# Patient Record
Sex: Female | Born: 1945 | Race: White | Hispanic: No | Marital: Married | Smoking: Former smoker
Health system: Southern US, Community
[De-identification: ages and names within clinical notes are randomized; demographics above are authoritative.]

## PROBLEM LIST (undated history)

## (undated) DIAGNOSIS — E7800 Pure hypercholesterolemia, unspecified: Secondary | ICD-10-CM

## (undated) DIAGNOSIS — I739 Peripheral vascular disease, unspecified: Secondary | ICD-10-CM

## (undated) DIAGNOSIS — D72829 Elevated white blood cell count, unspecified: Secondary | ICD-10-CM

## (undated) DIAGNOSIS — G4700 Insomnia, unspecified: Secondary | ICD-10-CM

## (undated) DIAGNOSIS — E669 Obesity, unspecified: Secondary | ICD-10-CM

## (undated) DIAGNOSIS — K219 Gastro-esophageal reflux disease without esophagitis: Secondary | ICD-10-CM

## (undated) DIAGNOSIS — Z9889 Other specified postprocedural states: Secondary | ICD-10-CM

## (undated) DIAGNOSIS — E039 Hypothyroidism, unspecified: Secondary | ICD-10-CM

## (undated) DIAGNOSIS — E559 Vitamin D deficiency, unspecified: Secondary | ICD-10-CM

## (undated) DIAGNOSIS — J309 Allergic rhinitis, unspecified: Secondary | ICD-10-CM

## (undated) DIAGNOSIS — J45909 Unspecified asthma, uncomplicated: Secondary | ICD-10-CM

## (undated) DIAGNOSIS — I10 Essential (primary) hypertension: Secondary | ICD-10-CM

## (undated) DIAGNOSIS — D75839 Thrombocytosis, unspecified: Secondary | ICD-10-CM

## (undated) DIAGNOSIS — M719 Bursopathy, unspecified: Secondary | ICD-10-CM

## (undated) DIAGNOSIS — I639 Cerebral infarction, unspecified: Secondary | ICD-10-CM

## (undated) DIAGNOSIS — R112 Nausea with vomiting, unspecified: Secondary | ICD-10-CM

## (undated) HISTORY — DX: Pure hypercholesterolemia, unspecified: E78.00

## (undated) HISTORY — PX: EYE SURGERY: SHX253

## (undated) HISTORY — PX: COLONOSCOPY: SHX174

## (undated) HISTORY — DX: Vitamin D deficiency, unspecified: E55.9

## (undated) HISTORY — PX: LUMBAR FUSION: SHX111

## (undated) HISTORY — PX: TONSILLECTOMY: SUR1361

## (undated) HISTORY — DX: Allergic rhinitis, unspecified: J30.9

## (undated) HISTORY — DX: Elevated white blood cell count, unspecified: D72.829

## (undated) HISTORY — DX: Obesity, unspecified: E66.9

## (undated) HISTORY — PX: OTHER SURGICAL HISTORY: SHX169

## (undated) HISTORY — DX: Thrombocytosis, unspecified: D75.839

## (undated) HISTORY — DX: Insomnia, unspecified: G47.00

---

## 1997-08-16 ENCOUNTER — Ambulatory Visit (HOSPITAL_COMMUNITY): Admission: RE | Admit: 1997-08-16 | Discharge: 1997-08-16 | Payer: Self-pay | Admitting: Obstetrics and Gynecology

## 1998-05-24 ENCOUNTER — Ambulatory Visit: Admission: RE | Admit: 1998-05-24 | Discharge: 1998-05-24 | Payer: Self-pay | Admitting: Cardiology

## 1998-08-24 ENCOUNTER — Ambulatory Visit (HOSPITAL_COMMUNITY): Admission: RE | Admit: 1998-08-24 | Discharge: 1998-08-24 | Payer: Self-pay | Admitting: Obstetrics and Gynecology

## 1998-10-27 ENCOUNTER — Other Ambulatory Visit: Admission: RE | Admit: 1998-10-27 | Discharge: 1998-10-27 | Payer: Self-pay | Admitting: *Deleted

## 1999-03-20 ENCOUNTER — Encounter: Admission: RE | Admit: 1999-03-20 | Discharge: 1999-06-18 | Payer: Self-pay | Admitting: Cardiology

## 1999-09-05 ENCOUNTER — Encounter: Payer: Self-pay | Admitting: Obstetrics and Gynecology

## 1999-09-05 ENCOUNTER — Ambulatory Visit (HOSPITAL_COMMUNITY): Admission: RE | Admit: 1999-09-05 | Discharge: 1999-09-05 | Payer: Self-pay | Admitting: Obstetrics and Gynecology

## 1999-10-06 ENCOUNTER — Other Ambulatory Visit: Admission: RE | Admit: 1999-10-06 | Discharge: 1999-10-06 | Payer: Self-pay | Admitting: Obstetrics and Gynecology

## 2000-09-25 ENCOUNTER — Ambulatory Visit (HOSPITAL_COMMUNITY): Admission: RE | Admit: 2000-09-25 | Discharge: 2000-09-25 | Payer: Self-pay | Admitting: Obstetrics and Gynecology

## 2000-09-25 ENCOUNTER — Encounter: Payer: Self-pay | Admitting: Obstetrics and Gynecology

## 2000-10-08 ENCOUNTER — Other Ambulatory Visit: Admission: RE | Admit: 2000-10-08 | Discharge: 2000-10-08 | Payer: Self-pay | Admitting: Obstetrics and Gynecology

## 2001-10-09 ENCOUNTER — Other Ambulatory Visit: Admission: RE | Admit: 2001-10-09 | Discharge: 2001-10-09 | Payer: Self-pay | Admitting: Obstetrics and Gynecology

## 2002-10-20 ENCOUNTER — Encounter: Payer: Self-pay | Admitting: Obstetrics and Gynecology

## 2002-10-20 ENCOUNTER — Ambulatory Visit (HOSPITAL_COMMUNITY): Admission: RE | Admit: 2002-10-20 | Discharge: 2002-10-20 | Payer: Self-pay | Admitting: Obstetrics and Gynecology

## 2002-10-22 ENCOUNTER — Other Ambulatory Visit: Admission: RE | Admit: 2002-10-22 | Discharge: 2002-10-22 | Payer: Self-pay | Admitting: Obstetrics and Gynecology

## 2003-03-16 ENCOUNTER — Encounter: Admission: RE | Admit: 2003-03-16 | Discharge: 2003-03-16 | Payer: Self-pay | Admitting: Internal Medicine

## 2003-06-25 ENCOUNTER — Ambulatory Visit (HOSPITAL_COMMUNITY): Admission: RE | Admit: 2003-06-25 | Discharge: 2003-06-25 | Payer: Self-pay | Admitting: Gastroenterology

## 2003-10-22 ENCOUNTER — Ambulatory Visit (HOSPITAL_COMMUNITY): Admission: RE | Admit: 2003-10-22 | Discharge: 2003-10-22 | Payer: Self-pay | Admitting: Obstetrics and Gynecology

## 2004-10-25 ENCOUNTER — Encounter: Admission: RE | Admit: 2004-10-25 | Discharge: 2004-10-25 | Payer: Self-pay | Admitting: Obstetrics and Gynecology

## 2005-01-24 ENCOUNTER — Encounter (INDEPENDENT_AMBULATORY_CARE_PROVIDER_SITE_OTHER): Payer: Self-pay | Admitting: Specialist

## 2005-01-24 ENCOUNTER — Ambulatory Visit (HOSPITAL_BASED_OUTPATIENT_CLINIC_OR_DEPARTMENT_OTHER): Admission: RE | Admit: 2005-01-24 | Discharge: 2005-01-24 | Payer: Self-pay | Admitting: Orthopedic Surgery

## 2005-03-08 ENCOUNTER — Encounter: Admission: RE | Admit: 2005-03-08 | Discharge: 2005-06-06 | Payer: Self-pay | Admitting: Family Medicine

## 2005-10-29 ENCOUNTER — Encounter: Admission: RE | Admit: 2005-10-29 | Discharge: 2005-10-29 | Payer: Self-pay | Admitting: Obstetrics and Gynecology

## 2006-11-12 ENCOUNTER — Encounter: Admission: RE | Admit: 2006-11-12 | Discharge: 2006-11-12 | Payer: Self-pay | Admitting: Obstetrics and Gynecology

## 2007-01-13 ENCOUNTER — Encounter: Admission: RE | Admit: 2007-01-13 | Discharge: 2007-01-13 | Payer: Self-pay | Admitting: Gastroenterology

## 2007-11-14 ENCOUNTER — Encounter: Admission: RE | Admit: 2007-11-14 | Discharge: 2007-11-14 | Payer: Self-pay | Admitting: Obstetrics and Gynecology

## 2007-11-25 ENCOUNTER — Encounter: Admission: RE | Admit: 2007-11-25 | Discharge: 2007-11-25 | Payer: Self-pay | Admitting: Obstetrics and Gynecology

## 2008-12-16 ENCOUNTER — Encounter: Admission: RE | Admit: 2008-12-16 | Discharge: 2008-12-16 | Payer: Self-pay | Admitting: Obstetrics and Gynecology

## 2009-12-20 ENCOUNTER — Encounter: Admission: RE | Admit: 2009-12-20 | Discharge: 2009-12-20 | Payer: Self-pay | Admitting: Obstetrics and Gynecology

## 2010-06-02 NOTE — Op Note (Signed)
NAME:  Monica Hinton, Monica Hinton                         ACCOUNT NO.:  [id number]   MEDICAL RECORD NO.:  [id number]                   PATIENT TYPE:  AMB   LOCATION:  ENDO                                 FACILITY:  [facility]   PHYSICIAN:  Bernette Redbird, M.D.                DATE OF BIRTH:  1945-07-06   DATE OF PROCEDURE:  06/25/2003  DATE OF DISCHARGE:                                 OPERATIVE REPORT   PROCEDURE:  Colonoscopy.   INDICATIONS FOR PROCEDURE:  Family history of colon cancer in her mother at  age 85.  The patient had a negative colonoscopy about six years ago.   OPERATIVE FINDINGS:  Severe melanosis coli.   PROCEDURE:  The patient provided written consent for the procedure.  Sedation was Fentanyl 75 mcg and Versed 8.5 mg IV without arrhythmias or  desaturations.  The Olympus adjustable tension pediatric video colonoscope  was advanced without significant difficult to the terminal ileum which had a  normal appearance and pull back was then performed.  The quality of the prep  was excellent and it was felt that all areas were well seen.  There was  severe melanosis coli throughout the colon, but this was, otherwise, a  normal examination without evidence of polyps, cancer, colitis, vascular  malformations, or diverticulosis.  Retroflexion in the rectum was  unremarkable.  No biopsies were taken.  The patient tolerated the procedure  well and there were no apparent complications.   IMPRESSION:  1. Family history of colon cancer without worrisome findings on this exam     (V16.0).  2. 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   PLAN:  Follow up colonoscopy in five years for ongoing screening.                                               Bernette Redbird, M.D.    RB/MEDQ  D:  06/25/2003  T:  06/25/2003  Job:  [id number]   cc:   Joycelyn Rua, M.D.  [address] [place]  [place] [address]  Fax: [phone]

## 2010-06-02 NOTE — Op Note (Signed)
NAME:  Monica Hinton, Monica Hinton               ACCOUNT NO.:  [id number]   MEDICAL RECORD NO.:  [id number]          PATIENT TYPE:  AMB   LOCATION:  DSC                          FACILITY:  [facility]   PHYSICIAN:  Cindee Salt, M.D.       DATE OF BIRTH:  1945-05-12   DATE OF PROCEDURE:  01/24/2005  DATE OF DISCHARGE:                                 OPERATIVE REPORT   PREOPERATIVE DIAGNOSIS:  Cyst dorsal right wrist.   POSTOPERATIVE DIAGNOSIS:  Cyst dorsal right wrist.   OPERATION:  Excision cyst, dorsal right wrist.   SURGEON:  Cindee Salt, M.D.   ASSISTANTCarolyne Fiscal R.N.   ANESTHESIA:  IV regional.   HISTORY:  The patient is a 66 year old female with a history of a mass on  the dorsal aspect of her right wrist. This not responded to conservative  treatment. She is desirous of removal   DESCRIPTION OF PROCEDURE:  The patient is brought to the operating room  where a forearm based IV regional anesthetic was carried out without  difficulty. She was prepped using DuraPrep, in the supine position with the  right arm free. A transverse incision was made over the mass carried down  through subcutaneous tissue. Bleeders were electrocauterized, dorsal sensory  nerves were identified and protected, retractors placed. The retinaculum was  separated.  With blunt dissection a large cystic structure was immediately  encountered. With blunt and sharp dissection this was dissected free. This  was found to be extremely large; was deflated. The stalk was followed down  into the scapholunate ligament complex; the joint opened.  The stalk and  cyst were removed and sent to pathology.   The area was debrided. The cyst arising from the scapholunate ligament. No  further lesions were identified. A rongeur was used to debride the area of  the scapholunate protecting the dorsal component. The wound was irrigated.  The capsule closed with figure-of-eight 4-0 Vicryl sutures. The retinaculum  and subcutaneous tissue were  closed interrupted 4-0 Vicryl and the skin with  a subcuticular 4-0 Monocryl suture. Steri-Strips were applied. Sterile  compressive dressing and volar wrist splint applied. The patient tolerated  the procedure well; and was taken to the recovery room for observation in  satisfactory condition. She is discharged home to return to the [facility]  of [place] in 1 week on [place] [facility].           ______________________________  Cindee Salt, M.D.     GK/MEDQ  D:  01/24/2005  T:  01/24/2005  Job:  [id number]

## 2010-11-16 ENCOUNTER — Other Ambulatory Visit: Payer: Self-pay | Admitting: Obstetrics and Gynecology

## 2010-11-21 ENCOUNTER — Other Ambulatory Visit: Payer: Self-pay | Admitting: Obstetrics and Gynecology

## 2010-11-21 DIAGNOSIS — Z1231 Encounter for screening mammogram for malignant neoplasm of breast: Secondary | ICD-10-CM

## 2010-11-21 DIAGNOSIS — Z780 Asymptomatic menopausal state: Secondary | ICD-10-CM

## 2010-12-22 ENCOUNTER — Ambulatory Visit
Admission: RE | Admit: 2010-12-22 | Discharge: 2010-12-22 | Disposition: A | Discharge status: Home / Self Care | Payer: 59 | Source: Ambulatory Visit | Attending: Obstetrics and Gynecology | Admitting: Obstetrics and Gynecology

## 2010-12-22 DIAGNOSIS — Z780 Asymptomatic menopausal state: Secondary | ICD-10-CM

## 2010-12-22 DIAGNOSIS — Z1231 Encounter for screening mammogram for malignant neoplasm of breast: Secondary | ICD-10-CM

## 2011-11-12 ENCOUNTER — Ambulatory Visit: Payer: 59 | Admitting: Family Medicine

## 2011-11-12 ENCOUNTER — Telehealth: Payer: Self-pay | Admitting: Family Medicine

## 2011-11-19 ENCOUNTER — Other Ambulatory Visit: Payer: Self-pay | Admitting: Obstetrics and Gynecology

## 2011-11-19 DIAGNOSIS — Z1231 Encounter for screening mammogram for malignant neoplasm of breast: Secondary | ICD-10-CM

## 2011-11-20 ENCOUNTER — Other Ambulatory Visit: Payer: Self-pay | Admitting: Obstetrics and Gynecology

## 2011-11-22 ENCOUNTER — Other Ambulatory Visit: Payer: Self-pay | Admitting: Obstetrics and Gynecology

## 2011-11-22 DIAGNOSIS — M810 Age-related osteoporosis without current pathological fracture: Secondary | ICD-10-CM

## 2011-12-24 ENCOUNTER — Ambulatory Visit
Admission: RE | Admit: 2011-12-24 | Discharge: 2011-12-24 | Disposition: A | Discharge status: Home / Self Care | Payer: 59 | Source: Ambulatory Visit | Attending: Obstetrics and Gynecology | Admitting: Obstetrics and Gynecology

## 2011-12-24 DIAGNOSIS — Z1231 Encounter for screening mammogram for malignant neoplasm of breast: Secondary | ICD-10-CM

## 2012-11-21 ENCOUNTER — Other Ambulatory Visit: Payer: Self-pay

## 2012-11-21 DIAGNOSIS — Z1231 Encounter for screening mammogram for malignant neoplasm of breast: Secondary | ICD-10-CM

## 2012-12-24 ENCOUNTER — Other Ambulatory Visit: Payer: Self-pay | Admitting: Obstetrics and Gynecology

## 2012-12-24 DIAGNOSIS — M810 Age-related osteoporosis without current pathological fracture: Secondary | ICD-10-CM

## 2012-12-25 ENCOUNTER — Ambulatory Visit
Admission: RE | Admit: 2012-12-25 | Discharge: 2012-12-25 | Disposition: A | Discharge status: Home / Self Care | Payer: 59 | Source: Ambulatory Visit | Attending: Obstetrics and Gynecology | Admitting: Obstetrics and Gynecology

## 2012-12-25 ENCOUNTER — Ambulatory Visit
Admission: RE | Admit: 2012-12-25 | Discharge: 2012-12-25 | Disposition: A | Discharge status: Home / Self Care | Payer: 59 | Source: Ambulatory Visit

## 2012-12-25 ENCOUNTER — Other Ambulatory Visit: Payer: 59

## 2012-12-25 DIAGNOSIS — M810 Age-related osteoporosis without current pathological fracture: Secondary | ICD-10-CM

## 2012-12-25 DIAGNOSIS — Z1231 Encounter for screening mammogram for malignant neoplasm of breast: Secondary | ICD-10-CM

## 2013-06-10 NOTE — Telephone Encounter (Signed)
error 

## 2013-12-14 ENCOUNTER — Other Ambulatory Visit: Payer: Self-pay

## 2013-12-14 DIAGNOSIS — Z1231 Encounter for screening mammogram for malignant neoplasm of breast: Secondary | ICD-10-CM

## 2014-01-13 ENCOUNTER — Ambulatory Visit
Admission: RE | Admit: 2014-01-13 | Discharge: 2014-01-13 | Disposition: A | Discharge status: Home / Self Care | Payer: 59 | Source: Ambulatory Visit

## 2014-01-13 DIAGNOSIS — Z1231 Encounter for screening mammogram for malignant neoplasm of breast: Secondary | ICD-10-CM

## 2014-01-21 ENCOUNTER — Ambulatory Visit: Payer: 59

## 2014-12-07 ENCOUNTER — Other Ambulatory Visit: Payer: Self-pay

## 2014-12-07 DIAGNOSIS — R928 Other abnormal and inconclusive findings on diagnostic imaging of breast: Secondary | ICD-10-CM

## 2015-01-18 ENCOUNTER — Ambulatory Visit: Payer: Self-pay

## 2015-02-07 ENCOUNTER — Ambulatory Visit: Payer: Self-pay

## 2015-02-28 ENCOUNTER — Ambulatory Visit: Payer: Self-pay

## 2015-03-16 ENCOUNTER — Ambulatory Visit: Payer: Self-pay

## 2015-03-16 ENCOUNTER — Other Ambulatory Visit: Payer: Self-pay | Admitting: Obstetrics and Gynecology

## 2015-03-16 DIAGNOSIS — E2839 Other primary ovarian failure: Secondary | ICD-10-CM

## 2015-04-04 ENCOUNTER — Ambulatory Visit
Admission: RE | Admit: 2015-04-04 | Discharge: 2015-04-04 | Disposition: A | Discharge status: Home / Self Care | Payer: 59 | Source: Ambulatory Visit

## 2015-04-04 ENCOUNTER — Other Ambulatory Visit: Payer: Self-pay

## 2015-04-04 DIAGNOSIS — R928 Other abnormal and inconclusive findings on diagnostic imaging of breast: Secondary | ICD-10-CM

## 2015-04-22 ENCOUNTER — Ambulatory Visit
Admission: RE | Admit: 2015-04-22 | Discharge: 2015-04-22 | Disposition: A | Discharge status: Home / Self Care | Payer: 59 | Source: Ambulatory Visit | Attending: Obstetrics and Gynecology | Admitting: Obstetrics and Gynecology

## 2015-04-22 DIAGNOSIS — E2839 Other primary ovarian failure: Secondary | ICD-10-CM

## 2016-08-24 NOTE — Progress Notes (Signed)
Please place orders in EPIC as patient is being scheduled for a pre-op appointment! Thank you! 

## 2016-08-27 ENCOUNTER — Ambulatory Visit: Payer: Self-pay | Admitting: Orthopedic Surgery

## 2016-09-12 NOTE — Patient Instructions (Addendum)
POLETTE NOFSINGER  09/12/2016   Your procedure is scheduled on: 09-19-16  Report to [facility] Main  Entrance    Report to admitting at 2:00PM   Call this number if you have problems the morning of surgery  [phone]   Remember: ONLY 1 PERSON MAY GO WITH YOU TO SHORT STAY TO GET  READY MORNING OF [place].  Do not eat food After Midnight. You may have clear liquids from midnight until 1030am day of surgery. Nothing by mouth after 1030am!!     Take these medicines the morning of surgery with A SIP OF WATER: levothyroxine(synthroid), rosuvastatin(crestor), rabeprazole(aciphex)                                You may not have any metal on your body including hair pins and              piercings  Do not wear jewelry, make-up, lotions, powders or perfumes, deodorant             Do not wear nail polish.  Do not shave  48 hours prior to surgery.     Do not bring valuables to the hospital. [place].  Contacts, dentures or bridgework may not be worn into surgery.  Leave suitcase in the car. After surgery it may be brought to your room.               Please read over the following fact sheets you were given: _____________________________________________________________________           [facility] - Preparing for Surgery Before surgery, you can play an important role.  Because skin is not sterile, your skin needs to be as free of germs as possible.  You can reduce the number of germs on your skin by washing with CHG (chlorahexidine gluconate) soap before surgery.  CHG is an antiseptic cleaner which kills germs and bonds with the skin to continue killing germs even after washing. Please DO NOT use if you have an allergy to CHG or antibacterial soaps.  If your skin becomes reddened/irritated stop using the CHG and inform your nurse when you arrive at Short Stay. Do not shave (including legs and underarms) for at  least 48 hours prior to the first CHG shower.  You may shave your face/neck. Please follow these instructions carefully:  1.  Shower with CHG Soap the night before surgery and the  morning of Surgery.  2.  If you choose to wash your hair, wash your hair first as usual with your  normal  shampoo.  3.  After you shampoo, rinse your hair and body thoroughly to remove the  shampoo.                           4.  Use CHG as you would any other liquid soap.  You can apply chg directly  to the skin and wash                       Gently with a scrungie or clean washcloth.  5.  Apply the CHG Soap to your body ONLY FROM THE NECK DOWN.  Do not use on face/ open                           Wound or open sores. Avoid contact with eyes, ears mouth and genitals (private parts).                       Wash face,  Genitals (private parts) with your normal soap.             6.  Wash thoroughly, paying special attention to the area where your surgery  will be performed.  7.  Thoroughly rinse your body with warm water from the neck down.  8.  DO NOT shower/wash with your normal soap after using and rinsing off  the CHG Soap.                9.  Pat yourself dry with a clean towel.            10.  Wear clean pajamas.            11.  Place clean sheets on your bed the night of your first shower and do not  sleep with pets. Day of Surgery : Do not apply any lotions/deodorants the morning of surgery.  Please wear clean clothes to the hospital/surgery center.  FAILURE TO FOLLOW THESE INSTRUCTIONS MAY RESULT IN THE CANCELLATION OF YOUR SURGERY PATIENT SIGNATURE_________________________________  NURSE SIGNATURE__________________________________  ________________________________________________________________________   Adam Phenix  An incentive spirometer is a tool that can help keep your lungs clear and active. This tool measures how well you are filling your lungs with each breath. Taking long deep breaths  may help reverse or decrease the chance of developing breathing (pulmonary) problems (especially infection) following:  A long period of time when you are unable to move or be active. BEFORE THE PROCEDURE   If the spirometer includes an indicator to show your best effort, your nurse or respiratory therapist will set it to a desired goal.  If possible, sit up straight or lean slightly forward. Try not to slouch.  Hold the incentive spirometer in an upright position. INSTRUCTIONS FOR USE  1. Sit on the edge of your bed if possible, or sit up as far as you can in bed or on a chair. 2. Hold the incentive spirometer in an upright position. 3. Breathe out normally. 4. Place the mouthpiece in your mouth and seal your lips tightly around it. 5. Breathe in slowly and as deeply as possible, raising the piston or the ball toward the top of the column. 6. Hold your breath for 3-5 seconds or for as long as possible. Allow the piston or ball to fall to the bottom of the column. 7. Remove the mouthpiece from your mouth and breathe out normally. 8. Rest for a few seconds and repeat Steps 1 through 7 at least 10 times every 1-2 hours when you are awake. Take your time and take a few normal breaths between deep breaths. 9. The spirometer may include an indicator to show your best effort. Use the indicator as a goal to work toward during each repetition. 10. After each set of 10 deep breaths, practice coughing to be sure your lungs are clear. If you have an incision (the cut made at the time of surgery), support your incision when coughing by placing a pillow or rolled up towels firmly against it. Once you are able to get out of  bed, walk around indoors and cough well. You may stop using the incentive spirometer when instructed by your caregiver.  RISKS AND COMPLICATIONS  Take your time so you do not get dizzy or light-headed.  If you are in pain, you may need to take or ask for pain medication before doing  incentive spirometry. It is harder to take a deep breath if you are having pain. AFTER USE  Rest and breathe slowly and easily.  It can be helpful to keep track of a log of your progress. Your caregiver can provide you with a simple table to help with this. If you are using the spirometer at home, follow these instructions: [place] IF:   You are having difficultly using the spirometer.  You have trouble using the spirometer as often as instructed.  Your pain medication is not giving enough relief while using the spirometer.  You develop fever of 100.5 F (38.1 C) or higher. SEEK IMMEDIATE MEDICAL CARE IF:   You cough up bloody sputum that had not been present before.  You develop fever of 102 F (38.9 C) or greater.  You develop worsening pain at or near the incision site. MAKE SURE YOU:   Understand these instructions.  Will watch your condition.  Will get help right away if you are not doing well or get worse. Document Released: 05/14/2006 Document Revised: 03/26/2011 Document Reviewed: 07/15/2006 ExitCare Patient Information 2014 [place].   ________________________________________________________________________    CLEAR LIQUID DIET   Foods Allowed                                                                     Foods Excluded  Coffee and tea, regular and decaf                             liquids that you cannot  Plain Jell-O in any flavor                                             see through such as: Fruit ices (not with fruit pulp)                                     milk, soups, orange juice  Iced Popsicles                                    All solid food Carbonated beverages, regular and diet                                    Cranberry, grape and apple juices Sports drinks like Gatorade Lightly seasoned clear broth or consume(fat free) Sugar, honey syrup  Sample Menu Breakfast  Lunch                                      Supper Cranberry juice                    Beef broth                            Chicken broth Jell-O                                     Grape juice                           Apple juice Coffee or tea                        Jell-O                                      Popsicle                                                Coffee or tea                        Coffee or tea  _____________________________________________________________________

## 2016-09-13 ENCOUNTER — Encounter (HOSPITAL_COMMUNITY)
Admission: RE | Admit: 2016-09-13 | Discharge: 2016-09-13 | Disposition: A | Discharge status: Home / Self Care | Payer: Medicare Other | Source: Ambulatory Visit | Attending: Orthopedic Surgery | Admitting: Orthopedic Surgery

## 2016-09-13 ENCOUNTER — Encounter (HOSPITAL_COMMUNITY): Payer: Self-pay

## 2016-09-13 DIAGNOSIS — Z01812 Encounter for preprocedural laboratory examination: Secondary | ICD-10-CM | POA: Diagnosis present

## 2016-09-13 HISTORY — DX: Unspecified asthma, uncomplicated: J45.909

## 2016-09-13 HISTORY — DX: Essential (primary) hypertension: I10

## 2016-09-13 HISTORY — DX: Hypothyroidism, unspecified: E03.9

## 2016-09-13 HISTORY — DX: Bursopathy, unspecified: M71.9

## 2016-09-13 HISTORY — DX: Gastro-esophageal reflux disease without esophagitis: K21.9

## 2016-09-13 HISTORY — DX: Peripheral vascular disease, unspecified: I73.9

## 2016-09-13 LAB — BASIC METABOLIC PANEL
Anion gap: 10 (ref 5–15)
BUN: 8 mg/dL (ref 6–20)
CALCIUM: 8.8 mg/dL — AB (ref 8.9–10.3)
CHLORIDE: 100 mmol/L — AB (ref 101–111)
CO2: 24 mmol/L (ref 22–32)
CREATININE: 0.69 mg/dL (ref 0.44–1.00)
GFR calc Af Amer: >60 mL/min (ref >60)
GLUCOSE: 100 mg/dL — AB (ref 65–99)
Potassium: 3.2 mmol/L — ABNORMAL LOW (ref 3.5–5.1)
SODIUM: 134 mmol/L — AB (ref 135–145)

## 2016-09-13 LAB — CBC
HEMATOCRIT: 35.3 % — AB (ref 36.0–46.0)
HEMOGLOBIN: 12.1 g/dL (ref 12.0–15.0)
MCH: 29.4 pg (ref 26.0–34.0)
MCHC: 34.3 g/dL (ref 30.0–36.0)
MCV: 85.7 fL (ref 78.0–100.0)
Platelets: 362 10*3/uL (ref 150–400)
RBC: 4.12 MIL/uL (ref 3.87–5.11)
RDW: 12.3 % (ref 11.5–15.5)
WBC: 11 10*3/uL — ABNORMAL HIGH (ref 4.0–10.5)

## 2016-09-14 NOTE — Progress Notes (Signed)
07-06-16 EKG on chart

## 2016-09-19 ENCOUNTER — Encounter (HOSPITAL_COMMUNITY)
Admission: RE | Disposition: A | Discharge status: Home / Self Care | Payer: Self-pay | Source: Ambulatory Visit | Attending: Orthopedic Surgery

## 2016-09-19 ENCOUNTER — Inpatient Hospital Stay (HOSPITAL_COMMUNITY): Payer: Medicare Other | Admitting: Certified Registered"

## 2016-09-19 ENCOUNTER — Observation Stay (HOSPITAL_COMMUNITY)
Admission: RE | Admit: 2016-09-19 | Discharge: 2016-09-20 | Disposition: A | Discharge status: Home / Self Care | Payer: Medicare Other | Source: Ambulatory Visit | Attending: Orthopedic Surgery | Admitting: Orthopedic Surgery

## 2016-09-19 ENCOUNTER — Encounter (HOSPITAL_COMMUNITY): Payer: Self-pay | Admitting: *Deleted

## 2016-09-19 DIAGNOSIS — J45909 Unspecified asthma, uncomplicated: Secondary | ICD-10-CM | POA: Insufficient documentation

## 2016-09-19 DIAGNOSIS — E039 Hypothyroidism, unspecified: Secondary | ICD-10-CM | POA: Insufficient documentation

## 2016-09-19 DIAGNOSIS — I739 Peripheral vascular disease, unspecified: Secondary | ICD-10-CM | POA: Diagnosis not present

## 2016-09-19 DIAGNOSIS — E739 Lactose intolerance, unspecified: Secondary | ICD-10-CM | POA: Diagnosis not present

## 2016-09-19 DIAGNOSIS — Z880 Allergy status to penicillin: Secondary | ICD-10-CM | POA: Diagnosis not present

## 2016-09-19 DIAGNOSIS — I10 Essential (primary) hypertension: Secondary | ICD-10-CM | POA: Insufficient documentation

## 2016-09-19 DIAGNOSIS — K219 Gastro-esophageal reflux disease without esophagitis: Secondary | ICD-10-CM | POA: Diagnosis not present

## 2016-09-19 DIAGNOSIS — M66851 Spontaneous rupture of other tendons, right thigh: Secondary | ICD-10-CM | POA: Diagnosis not present

## 2016-09-19 DIAGNOSIS — M6798 Unspecified disorder of synovium and tendon, other site: Secondary | ICD-10-CM | POA: Diagnosis present

## 2016-09-19 DIAGNOSIS — Z87891 Personal history of nicotine dependence: Secondary | ICD-10-CM | POA: Diagnosis not present

## 2016-09-19 DIAGNOSIS — Z885 Allergy status to narcotic agent status: Secondary | ICD-10-CM | POA: Insufficient documentation

## 2016-09-19 DIAGNOSIS — Z888 Allergy status to other drugs, medicaments and biological substances status: Secondary | ICD-10-CM | POA: Diagnosis not present

## 2016-09-19 DIAGNOSIS — Z91030 Bee allergy status: Secondary | ICD-10-CM | POA: Insufficient documentation

## 2016-09-19 DIAGNOSIS — M71551 Other bursitis, not elsewhere classified, right hip: Secondary | ICD-10-CM | POA: Diagnosis present

## 2016-09-19 DIAGNOSIS — Z79899 Other long term (current) drug therapy: Secondary | ICD-10-CM | POA: Insufficient documentation

## 2016-09-19 DIAGNOSIS — M67951 Unspecified disorder of synovium and tendon, right thigh: Secondary | ICD-10-CM | POA: Diagnosis present

## 2016-09-19 HISTORY — PX: OPEN SURGICAL REPAIR OF GLUTEAL TENDON: SHX5995

## 2016-09-19 LAB — CREATININE, SERUM
Creatinine, Ser: 0.6 mg/dL (ref 0.44–1.00)
GFR calc Af Amer: >60 mL/min (ref >60)
GFR calc non Af Amer: >60 mL/min (ref >60)

## 2016-09-19 LAB — CBC
HEMATOCRIT: 33.6 % — AB (ref 36.0–46.0)
HEMOGLOBIN: 11.4 g/dL — AB (ref 12.0–15.0)
MCH: 29.6 pg (ref 26.0–34.0)
MCHC: 33.9 g/dL (ref 30.0–36.0)
MCV: 87.3 fL (ref 78.0–100.0)
Platelets: 314 10*3/uL (ref 150–400)
RBC: 3.85 MIL/uL — AB (ref 3.87–5.11)
RDW: 12.3 % (ref 11.5–15.5)
WBC: 16.2 10*3/uL — ABNORMAL HIGH (ref 4.0–10.5)

## 2016-09-19 SURGERY — REPAIR, TENDON, GLUTEUS MEDIUS, OPEN
Anesthesia: General | Site: Hip | Laterality: Right

## 2016-09-19 MED ORDER — ACETAMINOPHEN 500 MG PO TABS
1000.0000 mg | ORAL_TABLET | Freq: Four times a day (QID) | ORAL | Status: DC
  Administered 2016-09-20: 1000 mg via ORAL
  Filled 2016-09-19: qty 2

## 2016-09-19 MED ORDER — MIDAZOLAM HCL 2 MG/2ML IJ SOLN
INTRAMUSCULAR | Status: AC
  Filled 2016-09-19: qty 2

## 2016-09-19 MED ORDER — HYDROMORPHONE HCL 2 MG PO TABS
2.0000 mg | ORAL_TABLET | ORAL | Status: DC | PRN
  Administered 2016-09-20 (×3): 4 mg via ORAL
  Filled 2016-09-19 (×3): qty 2

## 2016-09-19 MED ORDER — HYDROMORPHONE HCL-NACL 0.5-0.9 MG/ML-% IV SOSY
PREFILLED_SYRINGE | INTRAVENOUS | Status: AC
  Administered 2016-09-19: 0.5 mg via INTRAVENOUS
  Filled 2016-09-19: qty 2

## 2016-09-19 MED ORDER — ONDANSETRON HCL 4 MG/2ML IJ SOLN
INTRAMUSCULAR | Status: DC | PRN
  Administered 2016-09-19: 4 mg via INTRAVENOUS

## 2016-09-19 MED ORDER — PHENYLEPHRINE 40 MCG/ML (10ML) SYRINGE FOR IV PUSH (FOR BLOOD PRESSURE SUPPORT)
PREFILLED_SYRINGE | INTRAVENOUS | Status: AC
  Filled 2016-09-19: qty 10

## 2016-09-19 MED ORDER — ENOXAPARIN SODIUM 40 MG/0.4ML ~~LOC~~ SOLN
40.0000 mg | SUBCUTANEOUS | Status: DC
  Administered 2016-09-20: 08:00:00 40 mg via SUBCUTANEOUS
  Filled 2016-09-19: qty 0.4

## 2016-09-19 MED ORDER — ACETAMINOPHEN 10 MG/ML IV SOLN
INTRAVENOUS | Status: AC
Start: 2016-09-19 — End: 2016-09-19
  Filled 2016-09-19: qty 100

## 2016-09-19 MED ORDER — PROMETHAZINE HCL 25 MG/ML IJ SOLN: 6.2500 mg | INTRAMUSCULAR | Status: DC | PRN

## 2016-09-19 MED ORDER — PANTOPRAZOLE SODIUM 40 MG PO TBEC
40.0000 mg | DELAYED_RELEASE_TABLET | Freq: Every day | ORAL | Status: DC
  Administered 2016-09-20: 40 mg via ORAL
  Filled 2016-09-19: qty 1

## 2016-09-19 MED ORDER — HYDROCHLOROTHIAZIDE 25 MG PO TABS
25.0000 mg | ORAL_TABLET | Freq: Every day | ORAL | Status: DC
  Administered 2016-09-20: 08:00:00 25 mg via ORAL
  Filled 2016-09-19: qty 1

## 2016-09-19 MED ORDER — DEXAMETHASONE SODIUM PHOSPHATE 10 MG/ML IJ SOLN
INTRAMUSCULAR | Status: AC
  Filled 2016-09-19: qty 1

## 2016-09-19 MED ORDER — ONDANSETRON HCL 4 MG/2ML IJ SOLN
INTRAMUSCULAR | Status: AC
  Filled 2016-09-19: qty 4

## 2016-09-19 MED ORDER — DEXAMETHASONE SODIUM PHOSPHATE 10 MG/ML IJ SOLN
10.0000 mg | Freq: Once | INTRAMUSCULAR | Status: AC
  Administered 2016-09-19: 10 mg via INTRAVENOUS

## 2016-09-19 MED ORDER — ONDANSETRON HCL 4 MG PO TABS: 4.0000 mg | ORAL_TABLET | Freq: Four times a day (QID) | ORAL | Status: DC | PRN

## 2016-09-19 MED ORDER — CEFAZOLIN SODIUM-DEXTROSE 2-4 GM/100ML-% IV SOLN
INTRAVENOUS | Status: AC
  Filled 2016-09-19: qty 100

## 2016-09-19 MED ORDER — SUGAMMADEX SODIUM 200 MG/2ML IV SOLN
INTRAVENOUS | Status: AC
  Filled 2016-09-19: qty 2

## 2016-09-19 MED ORDER — BUPIVACAINE HCL (PF) 0.25 % IJ SOLN
INTRAMUSCULAR | Status: DC | PRN
  Administered 2016-09-19: 30 mL

## 2016-09-19 MED ORDER — SODIUM CHLORIDE 0.9 % IJ SOLN
INTRAMUSCULAR | Status: DC | PRN
Start: 2016-09-19 — End: 2016-09-19
  Administered 2016-09-19: 50 mL

## 2016-09-19 MED ORDER — SODIUM CHLORIDE 0.9 % IJ SOLN
INTRAMUSCULAR | Status: AC
  Filled 2016-09-19: qty 50

## 2016-09-19 MED ORDER — SCOPOLAMINE 1 MG/3DAYS TD PT72
MEDICATED_PATCH | TRANSDERMAL | Status: AC
  Filled 2016-09-19: qty 1

## 2016-09-19 MED ORDER — METOCLOPRAMIDE HCL 5 MG/ML IJ SOLN: 5.0000 mg | Freq: Three times a day (TID) | INTRAMUSCULAR | Status: DC | PRN

## 2016-09-19 MED ORDER — SUCCINYLCHOLINE CHLORIDE 200 MG/10ML IV SOSY
PREFILLED_SYRINGE | INTRAVENOUS | Status: AC
  Filled 2016-09-19: qty 10

## 2016-09-19 MED ORDER — POVIDONE-IODINE 10 % EX SOLN
Freq: Once | CUTANEOUS | Status: DC
  Filled 2016-09-19: qty 118

## 2016-09-19 MED ORDER — LIDOCAINE 2% (20 MG/ML) 5 ML SYRINGE
INTRAMUSCULAR | Status: AC
  Filled 2016-09-19: qty 15

## 2016-09-19 MED ORDER — LORATADINE 10 MG PO TABS
10.0000 mg | ORAL_TABLET | Freq: Every day | ORAL | Status: DC
  Filled 2016-09-19: qty 1

## 2016-09-19 MED ORDER — ONDANSETRON HCL 4 MG/2ML IJ SOLN
INTRAMUSCULAR | Status: AC
  Filled 2016-09-19: qty 2

## 2016-09-19 MED ORDER — MORPHINE SULFATE (PF) 2 MG/ML IV SOLN
1.0000 mg | INTRAVENOUS | Status: DC | PRN
  Administered 2016-09-19 (×2): 1 mg via INTRAVENOUS
  Filled 2016-09-19 (×2): qty 1

## 2016-09-19 MED ORDER — ROSUVASTATIN CALCIUM 10 MG PO TABS: 10.0000 mg | ORAL_TABLET | Freq: Every day | ORAL | Status: DC

## 2016-09-19 MED ORDER — BUPIVACAINE HCL (PF) 0.25 % IJ SOLN
INTRAMUSCULAR | Status: AC
  Filled 2016-09-19: qty 30

## 2016-09-19 MED ORDER — SCOPOLAMINE 1 MG/3DAYS TD PT72
MEDICATED_PATCH | TRANSDERMAL | Status: DC | PRN
  Administered 2016-09-19: 1 via TRANSDERMAL

## 2016-09-19 MED ORDER — TRAMADOL HCL 50 MG PO TABS
50.0000 mg | ORAL_TABLET | Freq: Four times a day (QID) | ORAL | Status: DC | PRN
  Administered 2016-09-19: 21:00:00 100 mg via ORAL
  Filled 2016-09-19: qty 2

## 2016-09-19 MED ORDER — PROPOFOL 10 MG/ML IV BOLUS
INTRAVENOUS | Status: AC
  Filled 2016-09-19: qty 20

## 2016-09-19 MED ORDER — LEVOTHYROXINE SODIUM 100 MCG PO TABS
100.0000 ug | ORAL_TABLET | Freq: Every day | ORAL | Status: DC
  Administered 2016-09-20: 100 ug via ORAL
  Filled 2016-09-19: qty 1

## 2016-09-19 MED ORDER — ONDANSETRON HCL 4 MG/2ML IJ SOLN: 4.0000 mg | Freq: Four times a day (QID) | INTRAMUSCULAR | Status: DC | PRN

## 2016-09-19 MED ORDER — ROCURONIUM BROMIDE 10 MG/ML (PF) SYRINGE
PREFILLED_SYRINGE | INTRAVENOUS | Status: DC | PRN
  Administered 2016-09-19: 40 mg via INTRAVENOUS

## 2016-09-19 MED ORDER — METHOCARBAMOL 500 MG PO TABS: 500.0000 mg | ORAL_TABLET | Freq: Four times a day (QID) | ORAL | Status: DC | PRN

## 2016-09-19 MED ORDER — ACETAMINOPHEN 325 MG PO TABS: 650.0000 mg | ORAL_TABLET | Freq: Four times a day (QID) | ORAL | Status: DC | PRN

## 2016-09-19 MED ORDER — BUPIVACAINE LIPOSOME 1.3 % IJ SUSP
INTRAMUSCULAR | Status: DC | PRN
  Administered 2016-09-19: 20 mL

## 2016-09-19 MED ORDER — SUGAMMADEX SODIUM 200 MG/2ML IV SOLN
INTRAVENOUS | Status: DC | PRN
  Administered 2016-09-19: 200 mg via INTRAVENOUS

## 2016-09-19 MED ORDER — BUPIVACAINE LIPOSOME 1.3 % IJ SUSP
20.0000 mL | Freq: Once | INTRAMUSCULAR | Status: DC
  Filled 2016-09-19: qty 20

## 2016-09-19 MED ORDER — MIDAZOLAM HCL 2 MG/2ML IJ SOLN
INTRAMUSCULAR | Status: AC
Start: 2016-09-19 — End: ?
  Filled 2016-09-19: qty 2

## 2016-09-19 MED ORDER — 0.9 % SODIUM CHLORIDE (POUR BTL) OPTIME
TOPICAL | Status: DC | PRN
  Administered 2016-09-19: 1000 mL

## 2016-09-19 MED ORDER — CEFAZOLIN SODIUM-DEXTROSE 2-4 GM/100ML-% IV SOLN
2.0000 g | INTRAVENOUS | Status: AC
  Administered 2016-09-19: 2 g via INTRAVENOUS

## 2016-09-19 MED ORDER — SODIUM CHLORIDE 0.9 % IV SOLN
INTRAVENOUS | Status: DC
  Administered 2016-09-19: 22:00:00 via INTRAVENOUS

## 2016-09-19 MED ORDER — PROPOFOL 10 MG/ML IV BOLUS
INTRAVENOUS | Status: DC | PRN
  Administered 2016-09-19: 50 mg via INTRAVENOUS
  Administered 2016-09-19: 150 mg via INTRAVENOUS

## 2016-09-19 MED ORDER — LIDOCAINE 2% (20 MG/ML) 5 ML SYRINGE
INTRAMUSCULAR | Status: DC | PRN
  Administered 2016-09-19: 60 mg via INTRAVENOUS

## 2016-09-19 MED ORDER — EPHEDRINE 5 MG/ML INJ
INTRAVENOUS | Status: AC
Start: 2016-09-19 — End: ?
  Filled 2016-09-19: qty 10

## 2016-09-19 MED ORDER — MONTELUKAST SODIUM 10 MG PO TABS
10.0000 mg | ORAL_TABLET | Freq: Every day | ORAL | Status: DC
  Filled 2016-09-19: qty 1

## 2016-09-19 MED ORDER — FENTANYL CITRATE (PF) 100 MCG/2ML IJ SOLN
INTRAMUSCULAR | Status: DC | PRN
  Administered 2016-09-19 (×2): 50 ug via INTRAVENOUS

## 2016-09-19 MED ORDER — METHOCARBAMOL 1000 MG/10ML IJ SOLN
500.0000 mg | Freq: Four times a day (QID) | INTRAMUSCULAR | Status: DC | PRN
  Administered 2016-09-19: 500 mg via INTRAVENOUS
  Filled 2016-09-19: qty 550

## 2016-09-19 MED ORDER — SODIUM CHLORIDE 0.9 % IJ SOLN
INTRAMUSCULAR | Status: AC
  Filled 2016-09-19: qty 10

## 2016-09-19 MED ORDER — CHLORHEXIDINE GLUCONATE 4 % EX LIQD: 60.0000 mL | Freq: Once | CUTANEOUS | Status: DC

## 2016-09-19 MED ORDER — DEXAMETHASONE SODIUM PHOSPHATE 10 MG/ML IJ SOLN
INTRAMUSCULAR | Status: AC
  Filled 2016-09-19: qty 2

## 2016-09-19 MED ORDER — ACETAMINOPHEN 10 MG/ML IV SOLN
1000.0000 mg | Freq: Once | INTRAVENOUS | Status: AC
  Administered 2016-09-19: 1000 mg via INTRAVENOUS

## 2016-09-19 MED ORDER — ROCURONIUM BROMIDE 50 MG/5ML IV SOSY
PREFILLED_SYRINGE | INTRAVENOUS | Status: AC
  Filled 2016-09-19: qty 5

## 2016-09-19 MED ORDER — ACETAMINOPHEN 650 MG RE SUPP: 650.0000 mg | Freq: Four times a day (QID) | RECTAL | Status: DC | PRN

## 2016-09-19 MED ORDER — LACTATED RINGERS IV SOLN
INTRAVENOUS | Status: DC
  Administered 2016-09-19 (×3): via INTRAVENOUS

## 2016-09-19 MED ORDER — ROCURONIUM BROMIDE 50 MG/5ML IV SOSY
PREFILLED_SYRINGE | INTRAVENOUS | Status: AC
  Filled 2016-09-19: qty 10

## 2016-09-19 MED ORDER — HYDROMORPHONE HCL-NACL 0.5-0.9 MG/ML-% IV SOSY
0.2500 mg | PREFILLED_SYRINGE | INTRAVENOUS | Status: DC | PRN
  Administered 2016-09-19 (×2): 0.5 mg via INTRAVENOUS

## 2016-09-19 MED ORDER — MIDAZOLAM HCL 2 MG/2ML IJ SOLN
INTRAMUSCULAR | Status: DC | PRN
  Administered 2016-09-19 (×2): 1 mg via INTRAVENOUS

## 2016-09-19 MED ORDER — METOCLOPRAMIDE HCL 5 MG PO TABS: 5.0000 mg | ORAL_TABLET | Freq: Three times a day (TID) | ORAL | Status: DC | PRN

## 2016-09-19 MED ORDER — POVIDONE-IODINE 10 % EX SWAB: 2.0000 "application " | Freq: Once | CUTANEOUS | Status: DC

## 2016-09-19 MED ORDER — FENTANYL CITRATE (PF) 250 MCG/5ML IJ SOLN
INTRAMUSCULAR | Status: AC
  Filled 2016-09-19: qty 5

## 2016-09-19 MED ORDER — FENTANYL CITRATE (PF) 100 MCG/2ML IJ SOLN
INTRAMUSCULAR | Status: AC
  Filled 2016-09-19: qty 2

## 2016-09-19 MED ORDER — FENTANYL CITRATE (PF) 250 MCG/5ML IJ SOLN
INTRAMUSCULAR | Status: DC | PRN
  Administered 2016-09-19 (×3): 50 ug via INTRAVENOUS

## 2016-09-19 MED ORDER — SUGAMMADEX SODIUM 200 MG/2ML IV SOLN
INTRAVENOUS | Status: AC
  Filled 2016-09-19: qty 4

## 2016-09-19 SURGICAL SUPPLY — 39 items
ANCH SUT 2 CP-2 EBND QANCHR+ (Anchor) ×2 IMPLANT
ANCHOR SUPER QUICK (Anchor) ×4 IMPLANT
BAG ZIPLOCK 12X15 (MISCELLANEOUS) IMPLANT
BLADE EXTENDED COATED 6.5IN (ELECTRODE) IMPLANT
DRAPE INCISE IOBAN 66X45 STRL (DRAPES) ×2 IMPLANT
DRAPE ORTHO SPLIT 77X108 STRL (DRAPES) ×4
DRAPE POUCH INSTRU U-SHP 10X18 (DRAPES) ×2 IMPLANT
DRAPE SURG ORHT 6 SPLT 77X108 (DRAPES) ×2 IMPLANT
DRAPE U-SHAPE 47X51 STRL (DRAPES) ×2 IMPLANT
DRSG ADAPTIC 3X8 NADH LF (GAUZE/BANDAGES/DRESSINGS) ×2 IMPLANT
DRSG MEPILEX BORDER 4X4 (GAUZE/BANDAGES/DRESSINGS) ×2 IMPLANT
DRSG MEPILEX BORDER 4X8 (GAUZE/BANDAGES/DRESSINGS) ×2 IMPLANT
ELECT REM PT RETURN 15FT ADLT (MISCELLANEOUS) ×2 IMPLANT
EVACUATOR 1/8 PVC DRAIN (DRAIN) ×2 IMPLANT
GAUZE SPONGE 4X4 12PLY STRL (GAUZE/BANDAGES/DRESSINGS) ×2 IMPLANT
GLOVE BIO SURGEON STRL SZ7.5 (GLOVE) ×2 IMPLANT
GLOVE BIO SURGEON STRL SZ8 (GLOVE) ×2 IMPLANT
GLOVE BIOGEL PI IND STRL 8 (GLOVE) ×2 IMPLANT
GLOVE BIOGEL PI INDICATOR 8 (GLOVE) ×2
GOWN STRL REUS W/TWL LRG LVL3 (GOWN DISPOSABLE) ×2 IMPLANT
GOWN STRL REUS W/TWL XL LVL3 (GOWN DISPOSABLE) ×2 IMPLANT
KIT BASIN OR (CUSTOM PROCEDURE TRAY) ×2 IMPLANT
MANIFOLD NEPTUNE II (INSTRUMENTS) ×2 IMPLANT
NDL SAFETY ECLIPSE 18X1.5 (NEEDLE) ×2 IMPLANT
NEEDLE HYPO 18GX1.5 SHARP (NEEDLE) ×4
NS IRRIG 1000ML POUR BTL (IV SOLUTION) ×2 IMPLANT
PACK TOTAL JOINT (CUSTOM PROCEDURE TRAY) ×2 IMPLANT
POSITIONER SURGICAL ARM (MISCELLANEOUS) ×2 IMPLANT
STAPLER VISISTAT 35W (STAPLE) IMPLANT
STRIP CLOSURE SKIN 1/2X4 (GAUZE/BANDAGES/DRESSINGS) ×2 IMPLANT
SUT MNCRL AB 4-0 PS2 18 (SUTURE) ×2 IMPLANT
SUT VIC AB 0 CT1 36 (SUTURE) ×2 IMPLANT
SUT VIC AB 2-0 CT1 27 (SUTURE) ×4
SUT VIC AB 2-0 CT1 TAPERPNT 27 (SUTURE) ×2 IMPLANT
SUT VLOC 180 0 24IN GS25 (SUTURE) ×2 IMPLANT
SYR 20CC LL (SYRINGE) IMPLANT
SYR 30ML LL (SYRINGE) ×2 IMPLANT
SYR 50ML LL SCALE MARK (SYRINGE) ×2 IMPLANT
TOWEL OR 17X26 10 PK STRL BLUE (TOWEL DISPOSABLE) ×2 IMPLANT

## 2016-09-19 NOTE — Anesthesia Preprocedure Evaluation (Addendum)
Anesthesia Evaluation  Patient identified by MRN, date of birth, ID band Patient awake    Reviewed: Allergy & Precautions, NPO status , Patient's Chart, lab work & pertinent test results  Airway Mallampati: II  TM Distance: >3 FB Neck ROM: Full    Dental no notable dental hx. (+) Dental Advisory Given   Pulmonary neg pulmonary ROS, former smoker,    Pulmonary exam normal breath sounds clear to auscultation       Cardiovascular hypertension, Normal cardiovascular exam Rhythm:Regular Rate:Normal     Neuro/Psych negative neurological ROS  negative psych ROS   GI/Hepatic negative GI ROS, Neg liver ROS,   Endo/Other  Hypothyroidism   Renal/GU negative Renal ROS  negative genitourinary   Musculoskeletal negative musculoskeletal ROS (+)   Abdominal   Peds negative pediatric ROS (+)  Hematology negative hematology ROS (+)   Anesthesia Other Findings   Reproductive/Obstetrics negative OB ROS                            Anesthesia Physical Anesthesia Plan  ASA: II  Anesthesia Plan: General   Post-op Pain Management:    Induction: Intravenous  PONV Risk Score and Plan: 2 and Ondansetron, Dexamethasone and Treatment may vary due to age or medical condition  Airway Management Planned: Oral ETT  Additional Equipment:   Intra-op Plan:   Post-operative Plan: Extubation in OR  Informed Consent: I have reviewed the patients History and Physical, chart, labs and discussed the procedure including the risks, benefits and alternatives for the proposed anesthesia with the patient or authorized representative who has indicated his/her understanding and acceptance.   Dental advisory given  Plan Discussed with: CRNA and Surgeon  Anesthesia Plan Comments:         Anesthesia Quick Evaluation

## 2016-09-19 NOTE — Transfer of Care (Signed)
Immediate Anesthesia Transfer of Care Note  Patient: Monica Hinton  Procedure(s) Performed: Procedure(s): Right hip bursectomy and gluteal tendon repair (Right)  Patient Location: PACU  Anesthesia Type:General  Level of Consciousness: sedated  Airway & Oxygen Therapy: Patient Spontanous Breathing and Patient connected to face mask oxygen  Post-op Assessment: Report given to RN and Post -op Vital signs reviewed and stable  Post vital signs: Reviewed and stable  Last Vitals: There were no vitals filed for this visit.  Last Pain: There were no vitals filed for this visit.       Complications: No apparent anesthesia complications

## 2016-09-19 NOTE — Op Note (Signed)
NAME:  SHANNEL, ZAHM NO.:  [id number]  MEDICAL RECORD NO.:  [id number]  LOCATION:                                 FACILITY:  PHYSICIAN:  Gaynelle Arabian, M.D.         DATE OF BIRTH:  DATE OF PROCEDURE:  09/19/2016 DATE OF DISCHARGE:                              OPERATIVE REPORT   PREOPERATIVE DIAGNOSIS:  Right hip intractable bursitis with gluteal tendon tear.  POSTOPERATIVE DIAGNOSIS:  Right hip intractable bursitis with gluteal tendon tear.  PROCEDURE:  Right hip bursectomy with gluteal tendon repair.  SURGEON:  Gaynelle Arabian, M.D.  ASSISTANT:  Alexzandrew L. Perkins, P.A.C.  ANESTHESIA:  General.  ESTIMATED BLOOD LOSS:  25 mL.  DRAINS:  Hemovac x1.  COMPLICATIONS:  None.  CONDITION:  Stable to recovery.  BRIEF CLINICAL NOTE:  Ms. Hattery is a 71 year old female with several year history of significant intermittent right hip pain.  She had some response to injections in the past.  MRI showed a complete gluteus medius tendon tear with retraction.  She also has some atrophy of the muscle.  Due to intractable nature of this, she presents now for removal of the trochanteric bursa and fixation of the gluteus medius tendon.  PROCEDURE IN DETAIL:  After successful administration of general anesthetic, the patient was placed in the left lateral decubitus position with the right side up and held with the hip positioner.  Right lower extremity was isolated from perineum with plastic drapes and prepped and draped in the usual sterile fashion.  A short incision was made centered over the tip of the greater trochanter crossing from proximal to distal.  The skin was cut with a 10 blade through the subcutaneous tissue to the level of the fascia lata.  This was incised in line with the skin incision.  There was a thickened calcified bursa present which was excised.  There was a tear at the central and anterior portions of the gluteus medius tendon with  retraction.  Two Mitek suture anchors were passed into the anterior and central portion of the greater trochanter and sutures were passed through the free edges of the tendon, and the tendon was advanced down onto the trochanter.  Sutures were tied to the bone and then tied to each other for securing fixation.  The bald area of the greater trochanter was fully covered now.  She had a very stable repair.  The rest of the bursal tissue was then excised.  The wound was then copiously irrigated with saline solution.  A 20 mL of Exparel mixed with 50 mL of saline was injected into the gluteal muscles, the fascia lata, and the subcutaneous tissues.  Additional 20 mL of 0.25% Marcaine was injected into the same tissues.  The fascia lata was then closed over Hemovac drain with a running #1 Vicryl suture. A small triangle of tissue had been removed, centered over the tip of the trochanter to prevent rubbing over the repair.  Subcu was closed in 2 layers, interrupted 2-0 Vicryl and subcuticular running 4-0 Monocryl.  The incision was cleaned and dried and Steri-Strips and a bulky sterile dressing applied.  The drains hooked  to suction.  She is awakened and transported to recovery in stable condition.     Gaynelle Arabian, M.D.     FA/MEDQ  D:  09/19/2016  T:  09/19/2016  Job:  [id number]

## 2016-09-19 NOTE — Anesthesia Postprocedure Evaluation (Signed)
Anesthesia Post Note  Patient: INDIE BOEHNE  Procedure(s) Performed: Procedure(s) (LRB): Right hip bursectomy and gluteal tendon repair (Right)     Patient location during evaluation: PACU Anesthesia Type: General Level of consciousness: awake and alert Pain management: pain level controlled Vital Signs Assessment: post-procedure vital signs reviewed and stable Respiratory status: spontaneous breathing, nonlabored ventilation, respiratory function stable and patient connected to nasal cannula oxygen Cardiovascular status: blood pressure returned to baseline and stable Postop Assessment: no signs of nausea or vomiting Anesthetic complications: no    Last Vitals:  Vitals:   09/19/16 1815 09/19/16 1828  BP: 121/68 122/72  Pulse: 66 67  Resp: 18 16  Temp: 36.4 C (!) 36.3 C  SpO2: 99% 98%    Last Pain:  Vitals:   09/19/16 1815  PainSc: Asleep                 Alysiana Ethridge S

## 2016-09-19 NOTE — Brief Op Note (Signed)
09/19/2016  4:54 PM  PATIENT:  Monica Hinton  71 y.o. female  PRE-OPERATIVE DIAGNOSIS:  Right hip intractable bursitis and gluteal tendon tear  POST-OPERATIVE DIAGNOSIS:  Right hip intractable bursitis and gluteal tendon tear  PROCEDURE:  Procedure(s): Right hip bursectomy and gluteal tendon repair (Right)  SURGEON:  Surgeon(s) and Role:    Gaynelle Arabian, MD - Primary  PHYSICIAN ASSISTANT:   ASSISTANTS: Arlee Muslim, PA-C   ANESTHESIA:   general  EBL:  Total I/O In: 1000 [I.V.:1000] Out: 25 [Blood:25]  BLOOD ADMINISTERED:none  DRAINS: (Medium) Hemovact drain(s) in the right hip with  Suction Open   LOCAL MEDICATIONS USED:  OTHER Exparel  COUNTS:  YES  TOURNIQUET:  * No tourniquets in log *  DICTATION: .Other Dictation: Dictation Number [id number]  PLAN OF CARE: Admit for overnight observation  PATIENT DISPOSITION:  PACU - hemodynamically stable.

## 2016-09-19 NOTE — Anesthesia Procedure Notes (Signed)
Procedure Name: Intubation Date/Time: 09/19/2016 3:56 PM Performed by: Cynda Familia Pre-anesthesia Checklist: Patient identified, Emergency Drugs available, Suction available and Patient being monitored Patient Re-evaluated:Patient Re-evaluated prior to induction Oxygen Delivery Method: Circle System Utilized Preoxygenation: Pre-oxygenation with 100% oxygen Induction Type: IV induction Ventilation: Mask ventilation without difficulty Laryngoscope Size: Miller and 2 Grade View: Grade II Tube type: Oral Number of attempts: 1 Airway Equipment and Method: Stylet Placement Confirmation: ETT inserted through vocal cords under direct vision,  positive ETCO2 and breath sounds checked- equal and bilateral Secured at: 21 cm Tube secured with: Tape Dental Injury: Teeth and Oropharynx as per pre-operative assessment  Comments: Smooth IV induction Rose-- intubation AM CRNA atraumatic  ---- teeth and mouth as preop--bilat BS Rose--- pt with limited neck mobility---

## 2016-09-19 NOTE — H&P (Signed)
CC- Monica Hinton is a 71 y.o. female who presents with right hip pain  Hip Pain: Patient complains of right hip pain. Onset of the symptoms was several years ago. Inciting event: none. Current symptoms include lateral hip pain with activity and with lying on right side. Associated symptoms: none. Aggravating symptoms: lateral movements, pivoting, walking and lying on right side.  Evaluation to date: plain radiographs are unremarkable and MRI shows complete tear of gluteus medius off of the greater trochanter.  Treatment to date: physical therapy, which has been not very effective and steroid injection which provided minimal relief.  Past Medical History:  Diagnosis Date  . Asthma    seasonal   . Bursitis    r hip   . GERD (gastroesophageal reflux disease)   . Hypertension   . Hypothyroidism   . Peripheral vascular disease ([place])    bilateral LE edema ; edma in hands as well     Past Surgical History:  Procedure Laterality Date  . bladder      bladder tack  20 years ago   . hysterectomy      20 years   . TONSILLECTOMY     age 10     Prior to Admission medications   Medication Sig Start Date End Date Taking? Authorizing Provider  acetaminophen (TYLENOL) 325 MG tablet Take 650 mg by mouth as needed. 09/10/16  Yes [provider]  cetirizine (ZYRTEC) 5 MG tablet Take 5 mg by mouth daily.   Yes [provider]  estradiol (ESTRACE) 1 MG tablet Take 1 mg by mouth daily.   Yes [provider]  hydrochlorothiazide (HYDRODIURIL) 25 MG tablet Take 25 mg by mouth daily.   Yes [provider]  ibuprofen (ADVIL,MOTRIN) 200 MG tablet Take 400 mg by mouth every 8 (eight) hours as needed for mild pain or moderate pain.   Yes [provider]  levothyroxine (SYNTHROID, LEVOTHROID) 100 MCG tablet Take 100 mcg by mouth daily before breakfast.   Yes [provider]  montelukast (SINGULAIR) 10 MG tablet Take 10 mg by mouth at bedtime.   Yes [provider]  RABEprazole (ACIPHEX) 20 MG tablet Take 20 mg by mouth daily.   Yes [provider]  rosuvastatin (CRESTOR) 10 MG tablet Take 10 mg by mouth every morning.   Yes [provider]  Vitamin D, Ergocalciferol, (DRISDOL) 50000 units CAPS capsule Take 50,000 Units by mouth every 7 (seven) days. No specific day - every 8 days   Yes [provider]  EPINEPHrine 0.3 mg/0.3 mL IJ SOAJ injection Inject 0.3 mg into the muscle once as needed.    [provider]    Physical Examination: General appearance - alert, well appearing, and in no distress Mental status - alert, oriented to person, place, and time Chest - clear to auscultation, no wheezes, rales or rhonchi, symmetric air entry Heart - normal rate, regular rhythm, normal S1, S2, no murmurs, rubs, clicks or gallops Abdomen - soft, nontender, nondistended, no masses or organomegaly Neurological - alert, oriented, normal speech, no focal findings or movement disorder noted  A right hip exam was performed. GENERAL: no acute distress SKIN: intact SWELLING: none WARMTH: no warmth TENDERNESS: maximal at greater trochanter ROM: normal STRENGTH: weak hip abduction GAIT: antalgic  ASSESSMENT:Right hip gluteal tendon tear  Plan  Right hip bursectomy and gluteal tendon repair. Discussed procedure, risks, potential complications and rehab course and she elects to proceed  Dione Plover. Montae Stager, MD  09/19/2016, 3:32 PM

## 2016-09-19 NOTE — Anesthesia Procedure Notes (Signed)
Date/Time: 09/19/2016 5:27 PM Performed by: Cynda Familia Oxygen Delivery Method: Simple face mask Placement Confirmation: positive ETCO2 and breath sounds checked- equal and bilateral Dental Injury: Teeth and Oropharynx as per pre-operative assessment  Comments: Extubated --- mask--- to pacu o2 intact

## 2016-09-20 DIAGNOSIS — M71551 Other bursitis, not elsewhere classified, right hip: Secondary | ICD-10-CM | POA: Diagnosis not present

## 2016-09-20 MED ORDER — METHOCARBAMOL 500 MG PO TABS: 500.0000 mg | ORAL_TABLET | Freq: Four times a day (QID) | ORAL | 0 refills | Status: DC | PRN

## 2016-09-20 MED ORDER — HYDROMORPHONE HCL 2 MG PO TABS: 2.0000 mg | ORAL_TABLET | ORAL | 0 refills | Status: DC | PRN

## 2016-09-20 MED ORDER — TRAMADOL HCL 50 MG PO TABS: 50.0000 mg | ORAL_TABLET | Freq: Four times a day (QID) | ORAL | 0 refills | Status: DC | PRN

## 2016-09-20 NOTE — Discharge Instructions (Signed)
Dr. Gaynelle Arabian [facility] [address] [place], [place] [address] [phone]  Bursectomy / Gluteal Tendon Repair Discharge Instructions  HOME CARE INSTRUCTIONS  Remove items at home which could result in a fall. This includes throw rugs or furniture in walking pathways.   ICE to the affected hip every three hours for 30 minutes at a time and then as needed for pain and swelling.  Continue to use ice on the hip for pain and swelling from surgery. You may notice swelling that will progress down to the foot and ankle.  This is normal after surgery.  Elevate the leg when you are not up walking on it.    Continue to use the breathing machine which will help keep your temperature down.  It is common for your temperature to cycle up and down following surgery, especially at night when you are not up moving around and exerting yourself.  The breathing machine keeps your lungs expanded and your temperature down. Sit on high chairs which makes it easier to stand.  Sit on chairs with arms. Use the chair arms to help push yourself up when arising.   No Active Abduction of the leg (No pulling leg out to the side away from the body).  Use your walker for first several days until comfortable ambulating.  DIET You may resume your previous home diet once your are discharged from the hospital.  DRESSING / WOUND CARE / SHOWERING You may shower 3 days after surgery, but keep the wounds dry during showering.  You may use an occlusive plastic wrap (Press'n Seal for example), NO SOAKING/SUBMERGING IN THE BATHTUB.  If the bandage gets wet, change with a clean dry gauze.  If the incision gets wet, pat the wound dry with a clean towel. You may start showering three days following surgery but do not submerge the incision under water.Just pat the incision dry and apply a dry gauze dressing on daily. Change the surgical dressing daily and reapply a dry  dressing each time.  ACTIVITY Walk with your walker as instructed. Use walker as long as suggested by your caregivers. Avoid periods of inactivity such as sitting longer than an hour when not asleep. This helps prevent blood clots.  You may resume a sexual relationship in one month or when given the OK by your doctor.  You may return to work once you are cleared by your doctor.  Do not drive a car for 6 weeks or until released by you surgeon.  Do not drive while taking narcotics.  WEIGHT BEARING Weight bearing as tolerated with assist device (walker, cane, etc) as directed, use it as long as suggested by your surgeon or therapist, typically at least 4-6 weeks.  POSTOPERATIVE CONSTIPATION PROTOCOL Constipation - defined medically as fewer than three stools per week and severe constipation as less than one stool per week.  One of the most common issues patients have following surgery is constipation.  Even if you have a regular bowel pattern at home, your normal regimen is likely to be disrupted due to multiple reasons following surgery.  Combination of anesthesia, postoperative narcotics, change in appetite and fluid intake all can affect your bowels.  In order to avoid complications following surgery, here are some recommendations in order to help you during your recovery period.  Colace (docusate) - Pick up an over-the-counter form of Colace or another stool softener and take twice a day as long as you are requiring  postoperative pain medications.  Take with a full glass of water daily.  If you experience loose stools or diarrhea, hold the colace until you stool forms back up.  If your symptoms do not get better within 1 week or if they get worse, check with your doctor.  Dulcolax (bisacodyl) - Pick up over-the-counter and take as directed by the product packaging as needed to assist with the movement of your bowels.  Take with a full glass of water.  Use this product as needed if not relieved  by Colace only.   MiraLax (polyethylene glycol) - Pick up over-the-counter to have on hand.  MiraLax is a solution that will increase the amount of water in your bowels to assist with bowel movements.  Take as directed and can mix with a glass of water, juice, soda, coffee, or tea.  Take if you go more than two days without a movement. Do not use MiraLax more than once per day. Call your doctor if you are still constipated or irregular after using this medication for 7 days in a row.  If you continue to have problems with postoperative constipation, please contact the office for further assistance and recommendations.  If you experience "the worst abdominal pain ever" or develop nausea or vomiting, please contact the office immediatly for further recommendations for treatment.  ITCHING  If you experience itching with your medications, try taking only a single pain pill, or even half a pain pill at a time.  You can also use Benadryl over the counter for itching or also to help with sleep.   TED HOSE STOCKINGS Wear the elastic stockings on both legs for three weeks following surgery during the day but you may remove then at night for sleeping.  MEDICATIONS See your medication summary on the After Visit Summary that the nursing staff will review with you prior to discharge.  You may have some home medications which will be placed on hold until you complete the course of blood thinner medication.  It is important for you to complete the blood thinner medication as prescribed by your surgeon.  Continue your approved medications as instructed at time of discharge.  PRECAUTIONS If you experience chest pain or shortness of breath - call 911 immediately for transfer to the hospital emergency department.  If you develop a fever greater that 101 F, purulent drainage from wound, increased redness or drainage from wound, foul odor from the wound/dressing, or calf pain - CONTACT YOUR SURGEON.                                                    FOLLOW-UP APPOINTMENTS Make sure you keep all of your appointments after your operation with your surgeon and caregivers. You should call the office at the above phone number and make an appointment for approximately two weeks after the date of your surgery or on the date instructed by your surgeon outlined in the "After Visit Summary".   Pick up stool softner and laxative for home use following surgery while on pain medications. Do not submerge incision under water. Please use good hand washing techniques while changing dressing each day. May shower starting three days after surgery. Please use a clean towel to pat the incision dry following showers. Continue to use ice for pain and swelling after surgery. Do not use  any lotions or creams on the incision until instructed by your surgeon.  Take a full dose Aspirin 325 mg Daily for three weeks.

## 2016-09-20 NOTE — Discharge Summary (Signed)
Physician Discharge Summary   Patient ID: Monica Hinton MRN: [id number] DOB/AGE: 1945/12/23 71 y.o.  Admit date: 09/19/2016 Discharge date: 09/20/2016  Primary Diagnosis:  Right hip intractable bursitis with gluteal tendon tear.  Admission Diagnoses:  Past Medical History:  Diagnosis Date  . Asthma    seasonal   . Bursitis    r hip   . GERD (gastroesophageal reflux disease)   . Hypertension   . Hypothyroidism   . Peripheral vascular disease ([place])    bilateral LE edema ; edma in hands as well    Discharge Diagnoses:   Principal Problem:   Tendinopathy of right gluteus medius  Estimated body mass index is 29.12 kg/m as calculated from the following:   Height as of this encounter: 5' 3.5" (1.613 m).   Weight as of this encounter: 75.8 kg (167 lb).  Procedure(s) (LRB): Right hip bursectomy and gluteal tendon repair (Right)   Consults: None  HPI:  [name] is a 71 year old female with several year history of significant intermittent right hip pain.  She had some response to injections in the past.  MRI showed a complete gluteus medius tendon tear with retraction.  She also has some atrophy of the muscle.  Due to intractable nature of this, she presents now for removal of the trochanteric bursa and fixation of the gluteus medius tendon.  Laboratory Data: Admission on 09/19/2016  Component Date Value Ref Range Status  . WBC 09/19/2016 16.2* 4.0 - 10.5 K/uL Final  . RBC 09/19/2016 3.85* 3.87 - 5.11 MIL/uL Final  . Hemoglobin 09/19/2016 11.4* 12.0 - 15.0 g/dL Final  . HCT 09/19/2016 33.6* 36.0 - 46.0 % Final  . MCV 09/19/2016 87.3  78.0 - 100.0 fL Final  . MCH 09/19/2016 29.6  26.0 - 34.0 pg Final  . MCHC 09/19/2016 33.9  30.0 - 36.0 g/dL Final  . RDW 09/19/2016 12.3  11.5 - 15.5 % Final  . Platelets 09/19/2016 314  150 - 400 K/uL Final  . Creatinine, Ser 09/19/2016 0.60  0.44 - 1.00 mg/dL Final  . GFR calc non Af Amer 09/19/2016 >60  >60 mL/min Final  . GFR calc Af  Amer 09/19/2016 >60  >60 mL/min Final   Comment: (NOTE) The eGFR has been calculated using the CKD EPI equation. This calculation has not been validated in all clinical situations. eGFR's persistently <60 mL/min signify possible Chronic Kidney Disease.   Hospital Outpatient Visit on 09/13/2016  Component Date Value Ref Range Status  . WBC 09/13/2016 11.0* 4.0 - 10.5 K/uL Final  . RBC 09/13/2016 4.12  3.87 - 5.11 MIL/uL Final  . Hemoglobin 09/13/2016 12.1  12.0 - 15.0 g/dL Final  . HCT 09/13/2016 35.3* 36.0 - 46.0 % Final  . MCV 09/13/2016 85.7  78.0 - 100.0 fL Final  . MCH 09/13/2016 29.4  26.0 - 34.0 pg Final  . MCHC 09/13/2016 34.3  30.0 - 36.0 g/dL Final  . RDW 09/13/2016 12.3  11.5 - 15.5 % Final  . Platelets 09/13/2016 362  150 - 400 K/uL Final  . Sodium 09/13/2016 134* 135 - 145 mmol/L Final  . Potassium 09/13/2016 3.2* 3.5 - 5.1 mmol/L Final  . Chloride 09/13/2016 100* 101 - 111 mmol/L Final  . CO2 09/13/2016 24  22 - 32 mmol/L Final  . Glucose, Bld 09/13/2016 100* 65 - 99 mg/dL Final  . BUN 09/13/2016 8  6 - 20 mg/dL Final  . Creatinine, Ser 09/13/2016 0.69  0.44 - 1.00 mg/dL Final  .  Calcium 09/13/2016 8.8* 8.9 - 10.3 mg/dL Final  . GFR calc non Af Amer 09/13/2016 >60  >60 mL/min Final  . GFR calc Af Amer 09/13/2016 >60  >60 mL/min Final   Comment: (NOTE) The eGFR has been calculated using the CKD EPI equation. This calculation has not been validated in all clinical situations. eGFR's persistently <60 mL/min signify possible Chronic Kidney Disease.   . Anion gap 09/13/2016 10  5 - 15 Final     X-Rays:No results found.  EKG:No orders found for this or any previous visit.   Hospital Course: Patient was admitted to [facility] and taken to the OR and underwent the above state procedure without complications.  Patient tolerated the procedure well and was later transferred to the recovery room and then to the orthopaedic floor for postoperative care.  They were  given PO and IV analgesics for pain control following their surgery.  They were given 24 hours of postoperative antibiotics of  Anti-infectives    Start     Dose/Rate Route Frequency Ordered Stop   09/20/16 0600  ceFAZolin (ANCEF) IVPB 2g/100 mL premix     2 g 200 mL/hr over 30 Minutes Intravenous On call to O.R. 09/19/16 1411 09/19/16 1628   09/19/16 1416  ceFAZolin (ANCEF) 2-4 GM/100ML-% IVPB    Comments:  Waldron Session   : cabinet override      09/19/16 1416 09/20/16 0229     and started on DVT prophylaxis in the form of Lovenox. Patient was encouraged to get up and ambulate the day after surgery.  The patient was allowed to be WBAT with therapy. Discharge planning was consulted to help with postop disposition and equipment needs.  Patient had a good night on the evening of surgery.  They started to get up OOB with therapy on day one.   Patient was seen in rounds and was ready to go home following morning ambulation.  Diet - Cardiac diet Follow up - in two weeks. Call office for appointment at [phone]. Activity - Weight bearing as tolerated to the surgical leg.  No active abduction of the leg, no pulling leg out to the side away from the body.  Walker for first several days until comfortable ambulating. May start showering three days following surgery but do not submerge incision under water. Continue to use ice for pain and swelling from surgery.  Baby Aspirin 81 mg daily for three weeks.  Please use walker for the first couple of days until comfortable ambulating.  May start changing dressing tomorrow with dry gauze and tape.  Disposition - Home Condition Upon Discharge - Stable D/C Meds - See DC Summary DVT Prophylaxis - Aspirin 325 mg for three weeks   Discharge Instructions    Call MD / Call 911    Complete by:  As directed    If you experience chest pain or shortness of breath, CALL 911 and be transported to the hospital emergency room.  If you develope a fever above 101 F,  pus (white drainage) or increased drainage or redness at the wound, or calf pain, call your surgeon's office.   Change dressing    Complete by:  As directed    You may change your dressing dressing daily with sterile 4 x 4 inch gauze dressing and paper tape.  Do not submerge the incision under water.   Constipation Prevention    Complete by:  As directed    Drink plenty of fluids.  Prune juice may  be helpful.  You may use a stool softener, such as Colace (over the counter) 100 mg twice a day.  Use MiraLax (over the counter) for constipation as needed.   Diet - low sodium heart healthy    Complete by:  As directed    Discharge instructions    Complete by:  As directed    Take a full dose Aspirin 325 mg daily for three weeks.  Pick up stool softner and laxative for home use following surgery while on pain medications. Do not submerge incision under water. Please use good hand washing techniques while changing dressing each day. May shower starting three days after surgery. Please use a clean towel to pat the incision dry following showers. Continue to use ice for pain and swelling after surgery. Do not use any lotions or creams on the incision until instructed by your surgeon.  Wear both TED hose on both legs during the day every day for three weeks, but may remove the TED hose at night at home.  Postoperative Constipation Protocol  Constipation - defined medically as fewer than three stools per week and severe constipation as less than one stool per week.  One of the most common issues patients have following surgery is constipation.  Even if you have a regular bowel pattern at home, your normal regimen is likely to be disrupted due to multiple reasons following surgery.  Combination of anesthesia, postoperative narcotics, change in appetite and fluid intake all can affect your bowels.  In order to avoid complications following surgery, here are some recommendations in order to help you  during your recovery period.  Colace (docusate) - Pick up an over-the-counter form of Colace or another stool softener and take twice a day as long as you are requiring postoperative pain medications.  Take with a full glass of water daily.  If you experience loose stools or diarrhea, hold the colace until you stool forms back up.  If your symptoms do not get better within 1 week or if they get worse, check with your doctor.  Dulcolax (bisacodyl) - Pick up over-the-counter and take as directed by the product packaging as needed to assist with the movement of your bowels.  Take with a full glass of water.  Use this product as needed if not relieved by Colace only.   MiraLax (polyethylene glycol) - Pick up over-the-counter to have on hand.  MiraLax is a solution that will increase the amount of water in your bowels to assist with bowel movements.  Take as directed and can mix with a glass of water, juice, soda, coffee, or tea.  Take if you go more than two days without a movement. Do not use MiraLax more than once per day. Call your doctor if you are still constipated or irregular after using this medication for 7 days in a row.  If you continue to have problems with postoperative constipation, please contact the office for further assistance and recommendations.  If you experience "the worst abdominal pain ever" or develop nausea or vomiting, please contact the office immediatly for further recommendations for treatment.   Driving restrictions    Complete by:  As directed    No driving until released by the physician.   Increase activity slowly as tolerated    Complete by:  As directed    Lifting restrictions    Complete by:  As directed    No lifting until released by the physician.   Patient may shower  Complete by:  As directed    You may shower without a dressing once there is no drainage.  Do not wash over the wound.  If drainage remains, do not shower until drainage stops.   TED hose     Complete by:  As directed    Use stockings (TED hose) for 3 weeks on both leg(s).  You may remove them at night for sleeping.   Weight bearing as tolerated    Complete by:  As directed      Allergies as of 09/20/2016      Reactions   Yellow Jacket Venom [bee Venom] Anaphylaxis   Pt has EPI PEN   Codeine Nausea And Vomiting   Other    Lactose Intolerant    Penicillins Hives   Many years - dr does not put pt on this   Chlorhexidine Rash   With preop washes      Medication List    STOP taking these medications   EPINEPHrine 0.3 mg/0.3 mL Soaj injection Commonly known as:  EPI-PEN   estradiol 1 MG tablet Commonly known as:  ESTRACE   ibuprofen 200 MG tablet Commonly known as:  ADVIL,MOTRIN   Vitamin D (Ergocalciferol) 50000 units Caps capsule Commonly known as:  DRISDOL     TAKE these medications   acetaminophen 325 MG tablet Commonly known as:  TYLENOL Take 650 mg by mouth as needed.   cetirizine 5 MG tablet Commonly known as:  ZYRTEC Take 5 mg by mouth daily.   hydrochlorothiazide 25 MG tablet Commonly known as:  HYDRODIURIL Take 25 mg by mouth daily.   HYDROmorphone 2 MG tablet Commonly known as:  DILAUDID Take 1-2 tablets (2-4 mg total) by mouth every 4 (four) hours as needed for severe pain.   levothyroxine 100 MCG tablet Commonly known as:  SYNTHROID, LEVOTHROID Take 100 mcg by mouth daily before breakfast.   methocarbamol 500 MG tablet Commonly known as:  ROBAXIN Take 1 tablet (500 mg total) by mouth every 6 (six) hours as needed for muscle spasms.   montelukast 10 MG tablet Commonly known as:  SINGULAIR Take 10 mg by mouth at bedtime.   RABEprazole 20 MG tablet Commonly known as:  ACIPHEX Take 20 mg by mouth daily.   rosuvastatin 10 MG tablet Commonly known as:  CRESTOR Take 10 mg by mouth every morning.   traMADol 50 MG tablet Commonly known as:  ULTRAM Take 1-2 tablets (50-100 mg total) by mouth every 6 (six) hours as needed for moderate  pain.            Discharge Care Instructions        Start     Ordered   09/20/16 0000  HYDROmorphone (DILAUDID) 2 MG tablet  Every 4 hours PRN    Question:  Supervising Provider  Answer:  Gaynelle Arabian   09/20/16 0730   09/20/16 0000  methocarbamol (ROBAXIN) 500 MG tablet  Every 6 hours PRN    Question:  Supervising Provider  Answer:  Gaynelle Arabian   09/20/16 0730   09/20/16 0000  traMADol (ULTRAM) 50 MG tablet  Every 6 hours PRN    Question:  Supervising Provider  Answer:  Gaynelle Arabian   09/20/16 0730   09/20/16 0000  Call MD / Call 911    Comments:  If you experience chest pain or shortness of breath, CALL 911 and be transported to the hospital emergency room.  If you develope a fever above 101 F, pus (white drainage)  or increased drainage or redness at the wound, or calf pain, call your surgeon's office.   09/20/16 0730   09/20/16 0000  Discharge instructions    Comments:  Take a full dose Aspirin 325 mg daily for three weeks.  Pick up stool softner and laxative for home use following surgery while on pain medications. Do not submerge incision under water. Please use good hand washing techniques while changing dressing each day. May shower starting three days after surgery. Please use a clean towel to pat the incision dry following showers. Continue to use ice for pain and swelling after surgery. Do not use any lotions or creams on the incision until instructed by your surgeon.  Wear both TED hose on both legs during the day every day for three weeks, but may remove the TED hose at night at home.  Postoperative Constipation Protocol  Constipation - defined medically as fewer than three stools per week and severe constipation as less than one stool per week.  One of the most common issues patients have following surgery is constipation.  Even if you have a regular bowel pattern at home, your normal regimen is likely to be disrupted due to multiple reasons following  surgery.  Combination of anesthesia, postoperative narcotics, change in appetite and fluid intake all can affect your bowels.  In order to avoid complications following surgery, here are some recommendations in order to help you during your recovery period.  Colace (docusate) - Pick up an over-the-counter form of Colace or another stool softener and take twice a day as long as you are requiring postoperative pain medications.  Take with a full glass of water daily.  If you experience loose stools or diarrhea, hold the colace until you stool forms back up.  If your symptoms do not get better within 1 week or if they get worse, check with your doctor.  Dulcolax (bisacodyl) - Pick up over-the-counter and take as directed by the product packaging as needed to assist with the movement of your bowels.  Take with a full glass of water.  Use this product as needed if not relieved by Colace only.   MiraLax (polyethylene glycol) - Pick up over-the-counter to have on hand.  MiraLax is a solution that will increase the amount of water in your bowels to assist with bowel movements.  Take as directed and can mix with a glass of water, juice, soda, coffee, or tea.  Take if you go more than two days without a movement. Do not use MiraLax more than once per day. Call your doctor if you are still constipated or irregular after using this medication for 7 days in a row.  If you continue to have problems with postoperative constipation, please contact the office for further assistance and recommendations.  If you experience "the worst abdominal pain ever" or develop nausea or vomiting, please contact the office immediatly for further recommendations for treatment.   09/20/16 0730   09/20/16 0000  Diet - low sodium heart healthy     09/20/16 0730   09/20/16 0000  Constipation Prevention    Comments:  Drink plenty of fluids.  Prune juice may be helpful.  You may use a stool softener, such as Colace (over the counter) 100 mg  twice a day.  Use MiraLax (over the counter) for constipation as needed.   09/20/16 0730   09/20/16 0000  Increase activity slowly as tolerated     09/20/16 0730   09/20/16 0000  Patient  may shower    Comments:  You may shower without a dressing once there is no drainage.  Do not wash over the wound.  If drainage remains, do not shower until drainage stops.   09/20/16 0730   09/20/16 0000  Weight bearing as tolerated     09/20/16 0730   09/20/16 0000  Driving restrictions    Comments:  No driving until released by the physician.   09/20/16 0730   09/20/16 0000  Lifting restrictions    Comments:  No lifting until released by the physician.   09/20/16 0730   09/20/16 0000  Change dressing    Comments:  You may change your dressing dressing daily with sterile 4 x 4 inch gauze dressing and paper tape.  Do not submerge the incision under water.   09/20/16 0730   09/20/16 0000  TED hose    Comments:  Use stockings (TED hose) for 3 weeks on both leg(s).  You may remove them at night for sleeping.   09/20/16 0730     Follow-up Information    Gaynelle Arabian, MD. Schedule an appointment as soon as possible for a visit on 10/02/2016.   Specialty:  Orthopedic Surgery Contact information: [address] [place] [address] [phone]           Signed: Arlee Muslim, PA-C Orthopaedic Surgery 09/20/2016, 7:31 AM

## 2016-09-20 NOTE — Progress Notes (Signed)
   Subjective: 1 Day Post-Op Procedure(s) (LRB): Right hip bursectomy and gluteal tendon repair (Right) Patient reports pain as mild.   Patient seen in rounds by Dr. Wynelle Link. Patient is well, but has had some minor complaints of pain in the hip, requiring pain medications Patient is ready to go home today   Objective: Vital signs in last 24 hours: Temp:  [97.4 F (36.3 C)-98.4 F (36.9 C)] 98.4 F (36.9 C) (09/06 0521) Pulse Rate:  [66-88] 88 (09/06 0521) Resp:  [11-20] 20 (09/06 0521) BP: (117-139)/(62-78) 139/78 (09/06 0521) SpO2:  [95 %-100 %] 96 % (09/06 0521) Weight:  [75.8 kg (167 lb)-76.2 kg (168 lb)] 75.8 kg (167 lb) (09/05 1828)  Intake/Output from previous day:  Intake/Output Summary (Last 24 hours) at 09/20/16 0724 Last data filed at 09/20/16 0600  Gross per 24 hour  Intake             2080 ml  Output             1335 ml  Net              745 ml    Intake/Output this shift: No intake/output data recorded.  EXAM: General - Patient is Alert and Appropriate Extremity - Neurovascular intact Sensation intact distally Intact pulses distally Dorsiflexion/Plantar flexion intact Dressing - clean, dry Motor Function - intact, moving foot and toes well on exam.   Assessment/Plan: 1 Day Post-Op Procedure(s) (LRB): Right hip bursectomy and gluteal tendon repair (Right) Procedure(s) (LRB): Right hip bursectomy and gluteal tendon repair (Right) Past Medical History:  Diagnosis Date  . Asthma    seasonal   . Bursitis    r hip   . GERD (gastroesophageal reflux disease)   . Hypertension   . Hypothyroidism   . Peripheral vascular disease ([place])    bilateral LE edema ; edma in hands as well    Principal Problem:   Tendinopathy of right gluteus medius  Estimated body mass index is 29.12 kg/m as calculated from the following:   Height as of this encounter: 5' 3.5" (1.613 m).   Weight as of this encounter: 75.8 kg (167 lb). Up with therapy Diet - Cardiac  diet Follow up - in two weeks. Call office for appointment at [phone]. Activity - Weight bearing as tolerated to the surgical leg.  No active abduction of the leg, (no pulling leg out to the side away from the body).  Walker for first several days until comfortable ambulating. May start showering three days following surgery but do not submerge incision under water. Continue to use ice for pain and swelling from surgery.  Aspirin 325 mg daily for three weeks.  Please use walker for the first couple of days until comfortable ambulating.  May start changing dressing tomorrow with dry gauze and tape.  Disposition - Home Condition Upon Discharge - Stable D/C Meds - See DC Summary DVT Prophylaxis - Aspirin 325 mg daily for 3 weeks  Arlee Muslim, PA-C Orthopaedic Surgery 09/20/2016, 7:24 AM

## 2017-07-24 ENCOUNTER — Ambulatory Visit (INDEPENDENT_AMBULATORY_CARE_PROVIDER_SITE_OTHER): Payer: Medicare Other | Admitting: Orthopedic Surgery

## 2017-08-05 ENCOUNTER — Ambulatory Visit (INDEPENDENT_AMBULATORY_CARE_PROVIDER_SITE_OTHER): Payer: Medicare Other | Admitting: Orthopedic Surgery

## 2017-09-26 ENCOUNTER — Other Ambulatory Visit: Payer: Self-pay | Admitting: Obstetrics and Gynecology

## 2017-09-26 DIAGNOSIS — E2839 Other primary ovarian failure: Secondary | ICD-10-CM

## 2017-11-05 ENCOUNTER — Other Ambulatory Visit: Payer: Self-pay | Admitting: Orthopedic Surgery

## 2017-11-05 DIAGNOSIS — M25551 Pain in right hip: Secondary | ICD-10-CM

## 2017-11-07 ENCOUNTER — Ambulatory Visit
Admission: RE | Admit: 2017-11-07 | Discharge: 2017-11-07 | Disposition: A | Discharge status: Home / Self Care | Payer: Medicare Other | Source: Ambulatory Visit | Attending: Orthopedic Surgery | Admitting: Orthopedic Surgery

## 2017-11-07 DIAGNOSIS — M25551 Pain in right hip: Secondary | ICD-10-CM

## 2017-11-22 ENCOUNTER — Ambulatory Visit
Admission: RE | Admit: 2017-11-22 | Discharge: 2017-11-22 | Disposition: A | Discharge status: Home / Self Care | Payer: Medicare Other | Source: Ambulatory Visit | Attending: Obstetrics and Gynecology | Admitting: Obstetrics and Gynecology

## 2017-11-22 DIAGNOSIS — E2839 Other primary ovarian failure: Secondary | ICD-10-CM

## 2018-02-11 ENCOUNTER — Other Ambulatory Visit: Payer: Self-pay | Admitting: Orthopedic Surgery

## 2018-02-11 ENCOUNTER — Ambulatory Visit
Admission: RE | Admit: 2018-02-11 | Discharge: 2018-02-11 | Disposition: A | Discharge status: Home / Self Care | Payer: Medicare Other | Source: Ambulatory Visit | Attending: Orthopedic Surgery | Admitting: Orthopedic Surgery

## 2018-02-11 DIAGNOSIS — M545 Low back pain, unspecified: Secondary | ICD-10-CM

## 2019-08-07 ENCOUNTER — Other Ambulatory Visit: Payer: Self-pay | Admitting: Neurosurgery

## 2019-08-31 ENCOUNTER — Other Ambulatory Visit: Payer: Self-pay | Admitting: Neurosurgery

## 2019-09-04 NOTE — Progress Notes (Signed)
[place], [place] [address] [address] Phone: [phone] Fax: [phone]  [id number] - [place], [place] [address] Phone: [phone] Fax: [phone]      Your procedure is scheduled on Thursday, August 26th.  Report to [facility] Main Entrance "A" at 5:30 A.M., and check in at the Admitting office.  Call this number if you have problems the morning of surgery:  [phone]  Call [phone] if you have any questions prior to your surgery date Monday-Friday 8am-4pm    Remember:  Do not eat or drink after midnight the night before your surgery     Take these medicines the morning of surgery with A SIP OF WATER   Tylenol - if needed  Gabapentin (Neurontin)  Levothyroxine (Synthroid)  Rabeprazole (Aciphex)  Rosuvastatin (Crestor)  As of today, STOP taking any Aspirin (unless otherwise instructed by your surgeon) Aleve, Naproxen, Ibuprofen, Motrin, Advil, Goody's, BC's, all herbal medications, fish oil, and all vitamins.                      Do not wear jewelry, make up, or nail polish            Do not wear lotions, powders, perfumes, or deodorant.            Do not shave 48 hours prior to surgery.              Do not bring valuables to the hospital.            [facility] is not responsible for any belongings or valuables.  Do NOT Smoke (Tobacco/Vaping) or drink Alcohol 24 hours prior to your procedure If you use a CPAP at night, you may bring all equipment for your overnight stay.   Contacts, glasses, dentures or bridgework may not be worn into surgery.      For patients admitted to the hospital, discharge time will be determined by your treatment team.   Patients discharged the day of surgery will not be allowed to drive home, and someone needs to stay with them for 24 hours.    Special instructions:   [place]-  Preparing For Surgery  Before surgery, you can play an important role. Because skin is not sterile, your skin needs to be as free of germs as possible. You can reduce the number of germs on your skin by washing with CHG (chlorahexidine gluconate) Soap before surgery.  CHG is an antiseptic cleaner which kills germs and bonds with the skin to continue killing germs even after washing.    Oral Hygiene is also important to reduce your risk of infection.  Remember - BRUSH YOUR TEETH THE MORNING OF SURGERY WITH YOUR REGULAR TOOTHPASTE  Please do not use if you have an allergy to CHG or antibacterial soaps. If your skin becomes reddened/irritated stop using the CHG.  Do not shave (including legs and underarms) for at least 48 hours prior to first CHG shower. It is OK to shave your face.  Please follow these instructions carefully.   1. Shower the NIGHT BEFORE SURGERY and the MORNING OF SURGERY with CHG Soap.   2. If you chose to wash your hair, wash your hair first as usual with your normal shampoo.  3. After you shampoo, rinse your hair and body thoroughly to remove the shampoo.  4. Use  CHG as you would any other liquid soap. You can apply CHG directly to the skin and wash gently with a scrungie or a clean washcloth.   5. Apply the CHG Soap to your body ONLY FROM THE NECK DOWN.  Do not use on open wounds or open sores. Avoid contact with your eyes, ears, mouth and genitals (private parts). Wash Face and genitals (private parts)  with your normal soap.   6. Wash thoroughly, paying special attention to the area where your surgery will be performed.  7. Thoroughly rinse your body with warm water from the neck down.  8. DO NOT shower/wash with your normal soap after using and rinsing off the CHG Soap.  9. Pat yourself dry with a CLEAN TOWEL.  10. Wear CLEAN PAJAMAS to bed the night before surgery  11. Place CLEAN SHEETS on your bed the night of your first shower and DO NOT SLEEP WITH  PETS.   Day of Surgery: Wear Clean/Comfortable clothing the morning of surgery Do not apply any deodorants/lotions.   Remember to brush your teeth WITH YOUR REGULAR TOOTHPASTE.   Please read over the following fact sheets that you were given.

## 2019-09-07 ENCOUNTER — Other Ambulatory Visit: Payer: Self-pay

## 2019-09-07 ENCOUNTER — Encounter (HOSPITAL_COMMUNITY): Payer: Self-pay

## 2019-09-07 ENCOUNTER — Other Ambulatory Visit (HOSPITAL_COMMUNITY)
Admission: RE | Admit: 2019-09-07 | Discharge: 2019-09-07 | Disposition: A | Discharge status: Home / Self Care | Payer: Medicare Other | Source: Ambulatory Visit | Attending: Physical Medicine and Rehabilitation | Admitting: Physical Medicine and Rehabilitation

## 2019-09-07 ENCOUNTER — Encounter (HOSPITAL_COMMUNITY)
Admission: RE | Admit: 2019-09-07 | Discharge: 2019-09-07 | Disposition: A | Discharge status: Home / Self Care | Payer: Medicare Other | Source: Ambulatory Visit | Attending: Neurosurgery | Admitting: Neurosurgery

## 2019-09-07 DIAGNOSIS — Z01818 Encounter for other preprocedural examination: Secondary | ICD-10-CM | POA: Insufficient documentation

## 2019-09-07 DIAGNOSIS — Z01812 Encounter for preprocedural laboratory examination: Secondary | ICD-10-CM | POA: Insufficient documentation

## 2019-09-07 DIAGNOSIS — Z20822 Contact with and (suspected) exposure to covid-19: Secondary | ICD-10-CM | POA: Insufficient documentation

## 2019-09-07 HISTORY — DX: Pure hypercholesterolemia, unspecified: E78.00

## 2019-09-07 LAB — BASIC METABOLIC PANEL
Anion gap: 13 (ref 5–15)
BUN: 5 mg/dL — ABNORMAL LOW (ref 8–23)
CO2: 22 mmol/L (ref 22–32)
Calcium: 9 mg/dL (ref 8.9–10.3)
Chloride: 97 mmol/L — ABNORMAL LOW (ref 98–111)
Creatinine, Ser: 0.58 mg/dL (ref 0.44–1.00)
GFR calc Af Amer: >60 mL/min (ref >60)
GFR calc non Af Amer: >60 mL/min (ref >60)
Glucose, Bld: 115 mg/dL — ABNORMAL HIGH (ref 70–99)
Potassium: 3.7 mmol/L (ref 3.5–5.1)
Sodium: 132 mmol/L — ABNORMAL LOW (ref 135–145)

## 2019-09-07 LAB — CBC
HCT: 40 % (ref 36.0–46.0)
Hemoglobin: 12.9 g/dL (ref 12.0–15.0)
MCH: 30.4 pg (ref 26.0–34.0)
MCHC: 32.3 g/dL (ref 30.0–36.0)
MCV: 94.3 fL (ref 80.0–100.0)
Platelets: 351 10*3/uL (ref 150–400)
RBC: 4.24 MIL/uL (ref 3.87–5.11)
RDW: 11.9 % (ref 11.5–15.5)
WBC: 11.5 10*3/uL — ABNORMAL HIGH (ref 4.0–10.5)
nRBC: 0 % (ref 0.0–0.2)

## 2019-09-07 LAB — TYPE AND SCREEN
ABO/RH(D): A NEG
Antibody Screen: NEGATIVE

## 2019-09-07 LAB — SURGICAL PCR SCREEN
MRSA, PCR: NEGATIVE
Staphylococcus aureus: POSITIVE — AB

## 2019-09-07 LAB — SARS CORONAVIRUS 2 (TAT 6-24 HRS): SARS Coronavirus 2: NEGATIVE

## 2019-09-07 NOTE — Progress Notes (Signed)
PCP - Orpah Melter Cardiologist - Concepcion Elk many many years ago, for cholesterol of 300 but not it is undercontrol, dr Olen Pel takes care of this now   Chest x-ray - not needed  EKG - 09/07/19 Stress Test - n/a ECHO - n/a Cardiac Cath - n/a  Blood Thinner Instructions: n/a Aspirin Instructions: n/a   COVID TEST- 09/07/19   Anesthesia review: NO  Patient denies shortness of breath, fever, cough and chest pain at PAT appointment   All instructions explained to the patient, with a verbal understanding of the material. Patient agrees to go over the instructions while at home for a better understanding. Patient also instructed to self quarantine after being tested for COVID-19. The opportunity to ask questions was provided.

## 2019-09-10 ENCOUNTER — Inpatient Hospital Stay (HOSPITAL_COMMUNITY): Payer: Medicare Other

## 2019-09-10 ENCOUNTER — Encounter (HOSPITAL_COMMUNITY)
Admission: RE | Disposition: A | Discharge status: Home / Self Care | Payer: Self-pay | Source: Home / Self Care | Attending: Neurosurgery

## 2019-09-10 ENCOUNTER — Other Ambulatory Visit: Payer: Self-pay

## 2019-09-10 ENCOUNTER — Inpatient Hospital Stay (HOSPITAL_COMMUNITY): Payer: Medicare Other | Admitting: Certified Registered Nurse Anesthetist

## 2019-09-10 ENCOUNTER — Encounter (HOSPITAL_COMMUNITY): Payer: Self-pay | Admitting: Neurosurgery

## 2019-09-10 ENCOUNTER — Inpatient Hospital Stay (HOSPITAL_COMMUNITY)
Admission: RE | Admit: 2019-09-10 | Discharge: 2019-09-11 | DRG: 455 | Disposition: A | Discharge status: Home / Self Care | Payer: Medicare Other | Attending: Neurosurgery | Admitting: Neurosurgery

## 2019-09-10 DIAGNOSIS — K219 Gastro-esophageal reflux disease without esophagitis: Secondary | ICD-10-CM | POA: Diagnosis present

## 2019-09-10 DIAGNOSIS — I10 Essential (primary) hypertension: Secondary | ICD-10-CM | POA: Diagnosis present

## 2019-09-10 DIAGNOSIS — Z888 Allergy status to other drugs, medicaments and biological substances status: Secondary | ICD-10-CM

## 2019-09-10 DIAGNOSIS — Z90710 Acquired absence of both cervix and uterus: Secondary | ICD-10-CM | POA: Diagnosis not present

## 2019-09-10 DIAGNOSIS — Z79890 Hormone replacement therapy (postmenopausal): Secondary | ICD-10-CM | POA: Diagnosis not present

## 2019-09-10 DIAGNOSIS — E669 Obesity, unspecified: Secondary | ICD-10-CM | POA: Diagnosis present

## 2019-09-10 DIAGNOSIS — M4726 Other spondylosis with radiculopathy, lumbar region: Secondary | ICD-10-CM | POA: Diagnosis present

## 2019-09-10 DIAGNOSIS — Z885 Allergy status to narcotic agent status: Secondary | ICD-10-CM | POA: Diagnosis not present

## 2019-09-10 DIAGNOSIS — M5116 Intervertebral disc disorders with radiculopathy, lumbar region: Secondary | ICD-10-CM | POA: Diagnosis present

## 2019-09-10 DIAGNOSIS — M48062 Spinal stenosis, lumbar region with neurogenic claudication: Secondary | ICD-10-CM | POA: Diagnosis present

## 2019-09-10 DIAGNOSIS — Z6830 Body mass index (BMI) 30.0-30.9, adult: Secondary | ICD-10-CM | POA: Diagnosis not present

## 2019-09-10 DIAGNOSIS — Z419 Encounter for procedure for purposes other than remedying health state, unspecified: Secondary | ICD-10-CM

## 2019-09-10 DIAGNOSIS — E039 Hypothyroidism, unspecified: Secondary | ICD-10-CM | POA: Diagnosis present

## 2019-09-10 DIAGNOSIS — Z79899 Other long term (current) drug therapy: Secondary | ICD-10-CM

## 2019-09-10 DIAGNOSIS — E7800 Pure hypercholesterolemia, unspecified: Secondary | ICD-10-CM | POA: Diagnosis present

## 2019-09-10 DIAGNOSIS — I739 Peripheral vascular disease, unspecified: Secondary | ICD-10-CM | POA: Diagnosis present

## 2019-09-10 DIAGNOSIS — J45909 Unspecified asthma, uncomplicated: Secondary | ICD-10-CM | POA: Diagnosis present

## 2019-09-10 DIAGNOSIS — Z880 Allergy status to penicillin: Secondary | ICD-10-CM

## 2019-09-10 DIAGNOSIS — Z20822 Contact with and (suspected) exposure to covid-19: Secondary | ICD-10-CM | POA: Diagnosis present

## 2019-09-10 DIAGNOSIS — M4316 Spondylolisthesis, lumbar region: Principal | ICD-10-CM | POA: Diagnosis present

## 2019-09-10 DIAGNOSIS — Z87891 Personal history of nicotine dependence: Secondary | ICD-10-CM

## 2019-09-10 LAB — ABO/RH: ABO/RH(D): A NEG

## 2019-09-10 SURGERY — POSTERIOR LUMBAR FUSION 1 LEVEL
Anesthesia: General

## 2019-09-10 MED ORDER — SUFENTANIL CITRATE 250 MCG/5ML IV SOLN
0.2500 ug/kg/h | INTRAVENOUS | Status: AC
  Administered 2019-09-10: .35 ug/kg/h via INTRAVENOUS
  Filled 2019-09-10 (×2): qty 5

## 2019-09-10 MED ORDER — HYDROMORPHONE HCL 2 MG PO TABS
2.0000 mg | ORAL_TABLET | ORAL | Status: DC | PRN
  Administered 2019-09-10 – 2019-09-11 (×5): 4 mg via ORAL
  Filled 2019-09-10 (×5): qty 2

## 2019-09-10 MED ORDER — DOCUSATE SODIUM 100 MG PO CAPS
100.0000 mg | ORAL_CAPSULE | Freq: Two times a day (BID) | ORAL | Status: DC
  Administered 2019-09-10 – 2019-09-11 (×2): 100 mg via ORAL
  Filled 2019-09-10 (×2): qty 1

## 2019-09-10 MED ORDER — BUPIVACAINE-EPINEPHRINE (PF) 0.5% -1:200000 IJ SOLN
INTRAMUSCULAR | Status: DC | PRN
  Administered 2019-09-10: 10 mL

## 2019-09-10 MED ORDER — BACITRACIN ZINC 500 UNIT/GM EX OINT
TOPICAL_OINTMENT | CUTANEOUS | Status: AC
  Filled 2019-09-10: qty 28.35

## 2019-09-10 MED ORDER — MIDAZOLAM HCL 2 MG/2ML IJ SOLN
INTRAMUSCULAR | Status: DC | PRN
  Administered 2019-09-10: 2 mg via INTRAVENOUS

## 2019-09-10 MED ORDER — ONDANSETRON HCL 4 MG PO TABS: 4.0000 mg | ORAL_TABLET | Freq: Four times a day (QID) | ORAL | Status: DC | PRN

## 2019-09-10 MED ORDER — THROMBIN 5000 UNITS EX SOLR
CUTANEOUS | Status: AC
  Filled 2019-09-10: qty 5000

## 2019-09-10 MED ORDER — SODIUM CHLORIDE 0.9% FLUSH: 3.0000 mL | INTRAVENOUS | Status: DC | PRN

## 2019-09-10 MED ORDER — 0.9 % SODIUM CHLORIDE (POUR BTL) OPTIME
TOPICAL | Status: DC | PRN
  Administered 2019-09-10: 1000 mL

## 2019-09-10 MED ORDER — ACETAMINOPHEN 325 MG PO TABS: 650.0000 mg | ORAL_TABLET | ORAL | Status: DC | PRN

## 2019-09-10 MED ORDER — NITROFURANTOIN MACROCRYSTAL 50 MG PO CAPS: 50.0000 mg | ORAL_CAPSULE | Freq: Every day | ORAL | Status: DC | PRN

## 2019-09-10 MED ORDER — ONDANSETRON HCL 4 MG/2ML IJ SOLN
INTRAMUSCULAR | Status: DC | PRN
  Administered 2019-09-10: 4 mg via INTRAVENOUS

## 2019-09-10 MED ORDER — BISACODYL 10 MG RE SUPP: 10.0000 mg | Freq: Every day | RECTAL | Status: DC | PRN

## 2019-09-10 MED ORDER — BUPIVACAINE-EPINEPHRINE 0.5% -1:200000 IJ SOLN
INTRAMUSCULAR | Status: AC
  Filled 2019-09-10: qty 3

## 2019-09-10 MED ORDER — DIPHENHYDRAMINE HCL 50 MG/ML IJ SOLN
INTRAMUSCULAR | Status: DC | PRN
  Administered 2019-09-10: 12.5 mg via INTRAVENOUS

## 2019-09-10 MED ORDER — ONDANSETRON HCL 4 MG/2ML IJ SOLN
INTRAMUSCULAR | Status: AC
  Filled 2019-09-10: qty 2

## 2019-09-10 MED ORDER — DIPHENHYDRAMINE HCL 50 MG/ML IJ SOLN
INTRAMUSCULAR | Status: AC
  Filled 2019-09-10: qty 1

## 2019-09-10 MED ORDER — SODIUM CHLORIDE 0.9% FLUSH
3.0000 mL | Freq: Two times a day (BID) | INTRAVENOUS | Status: DC
  Administered 2019-09-10: 3 mL via INTRAVENOUS

## 2019-09-10 MED ORDER — MORPHINE SULFATE (PF) 4 MG/ML IV SOLN
4.0000 mg | INTRAVENOUS | Status: DC | PRN
  Administered 2019-09-10: 4 mg via INTRAVENOUS
  Filled 2019-09-10: qty 1

## 2019-09-10 MED ORDER — CYCLOBENZAPRINE HCL 10 MG PO TABS
10.0000 mg | ORAL_TABLET | Freq: Three times a day (TID) | ORAL | Status: DC | PRN
  Administered 2019-09-10 – 2019-09-11 (×3): 10 mg via ORAL
  Filled 2019-09-10 (×3): qty 1

## 2019-09-10 MED ORDER — ROCURONIUM BROMIDE 10 MG/ML (PF) SYRINGE
PREFILLED_SYRINGE | INTRAVENOUS | Status: DC | PRN
  Administered 2019-09-10: 100 mg via INTRAVENOUS

## 2019-09-10 MED ORDER — PROPOFOL 10 MG/ML IV BOLUS
INTRAVENOUS | Status: AC
  Filled 2019-09-10: qty 40

## 2019-09-10 MED ORDER — FENTANYL CITRATE (PF) 250 MCG/5ML IJ SOLN
INTRAMUSCULAR | Status: AC
  Filled 2019-09-10: qty 5

## 2019-09-10 MED ORDER — FENTANYL CITRATE (PF) 250 MCG/5ML IJ SOLN
INTRAMUSCULAR | Status: DC | PRN
Start: 2019-09-10 — End: 2019-09-10
  Administered 2019-09-10 (×2): 50 ug via INTRAVENOUS
  Administered 2019-09-10: 100 ug via INTRAVENOUS
  Administered 2019-09-10: 50 ug via INTRAVENOUS

## 2019-09-10 MED ORDER — LIDOCAINE 2% (20 MG/ML) 5 ML SYRINGE
INTRAMUSCULAR | Status: AC
  Filled 2019-09-10: qty 5

## 2019-09-10 MED ORDER — GABAPENTIN 300 MG PO CAPS
600.0000 mg | ORAL_CAPSULE | Freq: Three times a day (TID) | ORAL | Status: DC
  Administered 2019-09-10 – 2019-09-11 (×3): 600 mg via ORAL
  Filled 2019-09-10 (×3): qty 2

## 2019-09-10 MED ORDER — PHENYLEPHRINE 40 MCG/ML (10ML) SYRINGE FOR IV PUSH (FOR BLOOD PRESSURE SUPPORT)
PREFILLED_SYRINGE | INTRAVENOUS | Status: AC
  Filled 2019-09-10: qty 10

## 2019-09-10 MED ORDER — PHENOL 1.4 % MT LIQD: 1.0000 | OROMUCOSAL | Status: DC | PRN

## 2019-09-10 MED ORDER — MIDAZOLAM HCL 2 MG/2ML IJ SOLN
INTRAMUSCULAR | Status: AC
  Filled 2019-09-10: qty 2

## 2019-09-10 MED ORDER — VANCOMYCIN HCL IN DEXTROSE 1-5 GM/200ML-% IV SOLN
1000.0000 mg | Freq: Once | INTRAVENOUS | Status: AC
  Administered 2019-09-10: 1000 mg via INTRAVENOUS
  Filled 2019-09-10: qty 200

## 2019-09-10 MED ORDER — HYDROMORPHONE HCL 1 MG/ML IJ SOLN
INTRAMUSCULAR | Status: AC
  Filled 2019-09-10: qty 1

## 2019-09-10 MED ORDER — HYDROMORPHONE HCL 1 MG/ML IJ SOLN
0.2500 mg | INTRAMUSCULAR | Status: DC | PRN
  Administered 2019-09-10 (×2): 0.5 mg via INTRAVENOUS

## 2019-09-10 MED ORDER — MONTELUKAST SODIUM 10 MG PO TABS
10.0000 mg | ORAL_TABLET | Freq: Every day | ORAL | Status: DC
  Administered 2019-09-10: 10 mg via ORAL
  Filled 2019-09-10 (×2): qty 1

## 2019-09-10 MED ORDER — BACITRACIN ZINC 500 UNIT/GM EX OINT
TOPICAL_OINTMENT | CUTANEOUS | Status: DC | PRN
  Administered 2019-09-10: 1 via TOPICAL

## 2019-09-10 MED ORDER — ROCURONIUM BROMIDE 10 MG/ML (PF) SYRINGE
PREFILLED_SYRINGE | INTRAVENOUS | Status: AC
  Filled 2019-09-10: qty 10

## 2019-09-10 MED ORDER — ORAL CARE MOUTH RINSE
15.0000 mL | Freq: Once | OROMUCOSAL | Status: AC
  Administered 2019-09-10: 15 mL via OROMUCOSAL

## 2019-09-10 MED ORDER — ROSUVASTATIN CALCIUM 5 MG PO TABS
10.0000 mg | ORAL_TABLET | Freq: Every day | ORAL | Status: DC
  Administered 2019-09-11: 10 mg via ORAL
  Filled 2019-09-10: qty 2

## 2019-09-10 MED ORDER — LEVOTHYROXINE SODIUM 112 MCG PO TABS
112.0000 ug | ORAL_TABLET | Freq: Every day | ORAL | Status: DC
  Administered 2019-09-11: 112 ug via ORAL
  Filled 2019-09-10: qty 1

## 2019-09-10 MED ORDER — PROMETHAZINE HCL 25 MG/ML IJ SOLN: 6.2500 mg | INTRAMUSCULAR | Status: DC | PRN

## 2019-09-10 MED ORDER — BUPIVACAINE LIPOSOME 1.3 % IJ SUSP
20.0000 mL | Freq: Once | INTRAMUSCULAR | Status: DC
  Filled 2019-09-10: qty 20

## 2019-09-10 MED ORDER — THROMBIN 5000 UNITS EX SOLR
OROMUCOSAL | Status: DC | PRN
  Administered 2019-09-10: 5 mL via TOPICAL

## 2019-09-10 MED ORDER — OXYCODONE HCL 5 MG/5ML PO SOLN: 5.0000 mg | Freq: Once | ORAL | Status: DC | PRN

## 2019-09-10 MED ORDER — ONDANSETRON HCL 4 MG/2ML IJ SOLN: 4.0000 mg | Freq: Four times a day (QID) | INTRAMUSCULAR | Status: DC | PRN

## 2019-09-10 MED ORDER — SODIUM CHLORIDE 0.9 % IV SOLN
INTRAVENOUS | Status: DC | PRN
  Administered 2019-09-10: 500 mL

## 2019-09-10 MED ORDER — ESTRADIOL 1 MG PO TABS
1.0000 mg | ORAL_TABLET | Freq: Every day | ORAL | Status: DC
  Administered 2019-09-10 – 2019-09-11 (×2): 1 mg via ORAL
  Filled 2019-09-10 (×2): qty 1

## 2019-09-10 MED ORDER — PHENYLEPHRINE 40 MCG/ML (10ML) SYRINGE FOR IV PUSH (FOR BLOOD PRESSURE SUPPORT)
PREFILLED_SYRINGE | INTRAVENOUS | Status: DC | PRN
  Administered 2019-09-10: 80 ug via INTRAVENOUS

## 2019-09-10 MED ORDER — LACTATED RINGERS IV SOLN: INTRAVENOUS | Status: DC | PRN

## 2019-09-10 MED ORDER — MENTHOL 3 MG MT LOZG: 1.0000 | LOZENGE | OROMUCOSAL | Status: DC | PRN

## 2019-09-10 MED ORDER — PROPOFOL 10 MG/ML IV BOLUS
INTRAVENOUS | Status: DC | PRN
  Administered 2019-09-10: 150 mg via INTRAVENOUS

## 2019-09-10 MED ORDER — SODIUM CHLORIDE 0.9 % IV SOLN: 250.0000 mL | INTRAVENOUS | Status: DC

## 2019-09-10 MED ORDER — LACTATED RINGERS IV SOLN: INTRAVENOUS | Status: DC

## 2019-09-10 MED ORDER — SUGAMMADEX SODIUM 200 MG/2ML IV SOLN
INTRAVENOUS | Status: DC | PRN
  Administered 2019-09-10: 200 mg via INTRAVENOUS

## 2019-09-10 MED ORDER — VANCOMYCIN HCL IN DEXTROSE 1-5 GM/200ML-% IV SOLN
INTRAVENOUS | Status: AC
  Administered 2019-09-10: 1000 mg via INTRAVENOUS
  Filled 2019-09-10: qty 200

## 2019-09-10 MED ORDER — ACETAMINOPHEN 500 MG PO TABS
1000.0000 mg | ORAL_TABLET | Freq: Four times a day (QID) | ORAL | Status: AC
  Administered 2019-09-10 – 2019-09-11 (×4): 1000 mg via ORAL
  Filled 2019-09-10 (×4): qty 2

## 2019-09-10 MED ORDER — CHLORHEXIDINE GLUCONATE 0.12 % MT SOLN: 15.0000 mL | Freq: Once | OROMUCOSAL | Status: AC

## 2019-09-10 MED ORDER — DEXAMETHASONE SODIUM PHOSPHATE 10 MG/ML IJ SOLN
INTRAMUSCULAR | Status: DC | PRN
  Administered 2019-09-10: 10 mg via INTRAVENOUS

## 2019-09-10 MED ORDER — PANTOPRAZOLE SODIUM 40 MG PO TBEC
40.0000 mg | DELAYED_RELEASE_TABLET | Freq: Every day | ORAL | Status: DC
  Administered 2019-09-11: 40 mg via ORAL
  Filled 2019-09-10: qty 1

## 2019-09-10 MED ORDER — PHENYLEPHRINE HCL-NACL 10-0.9 MG/250ML-% IV SOLN
INTRAVENOUS | Status: DC | PRN
  Administered 2019-09-10: 25 ug/min via INTRAVENOUS

## 2019-09-10 MED ORDER — VANCOMYCIN HCL IN DEXTROSE 1-5 GM/200ML-% IV SOLN: 1000.0000 mg | INTRAVENOUS | Status: AC

## 2019-09-10 MED ORDER — HYDROCHLOROTHIAZIDE 12.5 MG PO CAPS
12.5000 mg | ORAL_CAPSULE | Freq: Every day | ORAL | Status: DC
  Administered 2019-09-10 – 2019-09-11 (×2): 12.5 mg via ORAL
  Filled 2019-09-10 (×2): qty 1

## 2019-09-10 MED ORDER — DEXAMETHASONE SODIUM PHOSPHATE 10 MG/ML IJ SOLN
INTRAMUSCULAR | Status: AC
  Filled 2019-09-10: qty 1

## 2019-09-10 MED ORDER — ACETAMINOPHEN 650 MG RE SUPP: 650.0000 mg | RECTAL | Status: DC | PRN

## 2019-09-10 MED ORDER — ZOLPIDEM TARTRATE 5 MG PO TABS: 5.0000 mg | ORAL_TABLET | Freq: Every evening | ORAL | Status: DC | PRN

## 2019-09-10 MED ORDER — BUPIVACAINE LIPOSOME 1.3 % IJ SUSP
INTRAMUSCULAR | Status: DC | PRN
  Administered 2019-09-10: 20 mL

## 2019-09-10 MED ORDER — LIDOCAINE 2% (20 MG/ML) 5 ML SYRINGE
INTRAMUSCULAR | Status: DC | PRN
  Administered 2019-09-10: 60 mg via INTRAVENOUS

## 2019-09-10 MED ORDER — ACETAMINOPHEN 500 MG PO TABS: 1000.0000 mg | ORAL_TABLET | Freq: Once | ORAL | Status: DC

## 2019-09-10 MED ORDER — CHLORHEXIDINE GLUCONATE CLOTH 2 % EX PADS: 6.0000 | MEDICATED_PAD | Freq: Once | CUTANEOUS | Status: DC

## 2019-09-10 MED ORDER — OXYCODONE HCL 5 MG PO TABS: 5.0000 mg | ORAL_TABLET | Freq: Once | ORAL | Status: DC | PRN

## 2019-09-10 SURGICAL SUPPLY — 71 items
APL SKNCLS STERI-STRIP NONHPOA (GAUZE/BANDAGES/DRESSINGS) ×1
BAG DECANTER FOR FLEXI CONT (MISCELLANEOUS) ×3 IMPLANT
BASKET BONE COLLECTION (BASKET) ×3 IMPLANT
BENZOIN TINCTURE PRP APPL 2/3 (GAUZE/BANDAGES/DRESSINGS) ×3 IMPLANT
BLADE CLIPPER SURG (BLADE) IMPLANT
BUR MATCHSTICK NEURO 3.0 LAGG (BURR) ×3 IMPLANT
BUR PRECISION FLUTE 6.0 (BURR) ×3 IMPLANT
CAGE ALTERA 10X31X9-13 15D (Cage) ×2 IMPLANT
CAGE ALTERA 9-13-15-31MM (Cage) ×1 IMPLANT
CANISTER SUCT 3000ML PPV (MISCELLANEOUS) ×3 IMPLANT
CAP LOCK DLX THRD (Cap) ×12 IMPLANT
CARTRIDGE OIL MAESTRO DRILL (MISCELLANEOUS) ×1 IMPLANT
CLOSURE WOUND 1/2 X4 (GAUZE/BANDAGES/DRESSINGS) ×1
CNTNR URN SCR LID CUP LEK RST (MISCELLANEOUS) ×1 IMPLANT
CONT SPEC 4OZ STRL OR WHT (MISCELLANEOUS) ×3
COVER BACK TABLE 60X90IN (DRAPES) ×3 IMPLANT
COVER WAND RF STERILE (DRAPES) IMPLANT
DECANTER SPIKE VIAL GLASS SM (MISCELLANEOUS) ×3 IMPLANT
DIFFUSER DRILL AIR PNEUMATIC (MISCELLANEOUS) ×3 IMPLANT
DRAPE C-ARM 42X72 X-RAY (DRAPES) ×6 IMPLANT
DRAPE HALF SHEET 40X57 (DRAPES) ×3 IMPLANT
DRAPE LAPAROTOMY 100X72X124 (DRAPES) ×3 IMPLANT
DRAPE SURG 17X23 STRL (DRAPES) ×12 IMPLANT
DRSG OPSITE POSTOP 4X6 (GAUZE/BANDAGES/DRESSINGS) ×3 IMPLANT
ELECT BLADE 4.0 EZ CLEAN MEGAD (MISCELLANEOUS) ×3
ELECT REM PT RETURN 9FT ADLT (ELECTROSURGICAL) ×3
ELECTRODE BLDE 4.0 EZ CLN MEGD (MISCELLANEOUS) ×1 IMPLANT
ELECTRODE REM PT RTRN 9FT ADLT (ELECTROSURGICAL) ×1 IMPLANT
EVACUATOR 1/8 PVC DRAIN (DRAIN) IMPLANT
GAUZE 4X4 16PLY RFD (DISPOSABLE) ×3 IMPLANT
GAUZE SPONGE 4X4 12PLY STRL (GAUZE/BANDAGES/DRESSINGS) ×3 IMPLANT
GLOVE BIO SURGEON STRL SZ 6.5 (GLOVE) ×2 IMPLANT
GLOVE BIO SURGEON STRL SZ8 (GLOVE) ×6 IMPLANT
GLOVE BIO SURGEON STRL SZ8.5 (GLOVE) ×6 IMPLANT
GLOVE BIO SURGEONS STRL SZ 6.5 (GLOVE) ×1
GLOVE BIOGEL PI IND STRL 6.5 (GLOVE) ×1 IMPLANT
GLOVE BIOGEL PI INDICATOR 6.5 (GLOVE) ×2
GLOVE EXAM NITRILE XL STR (GLOVE) IMPLANT
GOWN STRL REUS W/ TWL LRG LVL3 (GOWN DISPOSABLE) ×1 IMPLANT
GOWN STRL REUS W/ TWL XL LVL3 (GOWN DISPOSABLE) ×2 IMPLANT
GOWN STRL REUS W/TWL 2XL LVL3 (GOWN DISPOSABLE) IMPLANT
GOWN STRL REUS W/TWL LRG LVL3 (GOWN DISPOSABLE) ×3
GOWN STRL REUS W/TWL XL LVL3 (GOWN DISPOSABLE) ×6
HEMOSTAT POWDER KIT SURGIFOAM (HEMOSTASIS) ×3 IMPLANT
KIT BASIN OR (CUSTOM PROCEDURE TRAY) ×3 IMPLANT
KIT TURNOVER KIT B (KITS) ×3 IMPLANT
MILL MEDIUM DISP (BLADE) IMPLANT
NEEDLE HYPO 21X1.5 SAFETY (NEEDLE) IMPLANT
NEEDLE HYPO 22GX1.5 SAFETY (NEEDLE) ×3 IMPLANT
NS IRRIG 1000ML POUR BTL (IV SOLUTION) ×3 IMPLANT
OIL CARTRIDGE MAESTRO DRILL (MISCELLANEOUS) ×3
PACK LAMINECTOMY NEURO (CUSTOM PROCEDURE TRAY) ×3 IMPLANT
PAD ARMBOARD 7.5X6 YLW CONV (MISCELLANEOUS) ×9 IMPLANT
PATTIES SURGICAL .5 X1 (DISPOSABLE) IMPLANT
PUTTY DBM 10CC CALC GRAN (Putty) ×3 IMPLANT
ROD CREO DLX CVD 6.35X40 (Rod) ×2 IMPLANT
ROD CURVED TI 6.35X40 (Rod) ×6 IMPLANT
SCREW PA CREO DLX 6.5X45 (Screw) ×6 IMPLANT
SCREW PA CREO DLX 6.5X50 (Screw) ×6 IMPLANT
SPONGE LAP 4X18 RFD (DISPOSABLE) IMPLANT
SPONGE NEURO XRAY DETECT 1X3 (DISPOSABLE) IMPLANT
SPONGE SURGIFOAM ABS GEL 100 (HEMOSTASIS) IMPLANT
STRIP CLOSURE SKIN 1/2X4 (GAUZE/BANDAGES/DRESSINGS) ×2 IMPLANT
SUT VIC AB 1 CT1 18XBRD ANBCTR (SUTURE) ×2 IMPLANT
SUT VIC AB 1 CT1 8-18 (SUTURE) ×6
SUT VIC AB 2-0 CP2 18 (SUTURE) ×6 IMPLANT
SYR 20ML LL LF (SYRINGE) IMPLANT
TOWEL GREEN STERILE (TOWEL DISPOSABLE) ×3 IMPLANT
TOWEL GREEN STERILE FF (TOWEL DISPOSABLE) ×3 IMPLANT
TRAY FOLEY MTR SLVR 16FR STAT (SET/KITS/TRAYS/PACK) ×3 IMPLANT
WATER STERILE IRR 1000ML POUR (IV SOLUTION) ×3 IMPLANT

## 2019-09-10 NOTE — H&P (Signed)
Subjective: The patient is a 74 year old white female who has complained of back and right greater left leg pain consistent with neurogenic claudication.  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  I discussed the various treatment options with her.  She has decided to proceed with surgery.  Past Medical History:  Diagnosis Date  . Asthma    seasonal   . Bursitis    r hip   . GERD (gastroesophageal reflux disease)   . High cholesterol   . Hypertension   . Hypothyroidism   . Peripheral vascular disease ([place])    bilateral LE edema ; edma in hands as well     Past Surgical History:  Procedure Laterality Date  . bladder      bladder tack  20 years ago   . COLONOSCOPY    . EYE SURGERY Bilateral    catracts  . hysterectomy      20 years   . OPEN SURGICAL REPAIR OF GLUTEAL TENDON Right 09/19/2016   Procedure: Right hip bursectomy and gluteal tendon repair;  Surgeon: Gaynelle Arabian, MD;  Location: [facility];  Service: Orthopedics;  Laterality: Right;  . TONSILLECTOMY     age 81     Allergies  Allergen Reactions  . Yellow Jacket Venom [Bee Venom] Anaphylaxis    Pt has EPI PEN  . Codeine Nausea And Vomiting  . Other     Lactose Intolerant    . Penicillins Hives    Many years - dr does not put pt on this  . Chlorhexidine Rash    With preop washes    Social History   Tobacco Use  . Smoking status: Former Smoker    Years: 10.00  . Smokeless tobacco: Never Used  . Tobacco comment: 25 years ago quit   Substance Use Topics  . Alcohol use: Yes    Comment: occasionally wine     History reviewed. No pertinent family history. Prior to Admission medications   Medication Sig Start Date End Date Taking? Authorizing Provider  acetaminophen (TYLENOL) 500 MG tablet Take 1,000 mg by mouth in the morning, at noon, in the evening, and at bedtime.   Yes [provider]  estradiol (ESTRACE) 1  MG tablet Take 1 mg by mouth daily. 08/07/19  Yes [provider]  gabapentin (NEURONTIN) 300 MG capsule Take 600 mg by mouth in the morning, at noon, in the evening, and at bedtime. 06/08/19  Yes [provider]  hydrochlorothiazide (HYDRODIURIL) 12.5 MG tablet Take 12.5 mg by mouth daily. 06/18/19  Yes [provider]  levothyroxine (SYNTHROID) 112 MCG tablet Take 112 mcg by mouth daily before breakfast. 06/29/19  Yes [provider]  montelukast (SINGULAIR) 10 MG tablet Take 10 mg by mouth at bedtime.   Yes [provider]  RABEprazole (ACIPHEX) 20 MG tablet Take 20 mg by mouth daily before breakfast.    Yes [provider]  rosuvastatin (CRESTOR) 10 MG tablet Take 10 mg by mouth daily.    Yes [provider]  Vitamin D, Ergocalciferol, (DRISDOL) 1.25 MG (50000 UNIT) CAPS capsule Take 50,000 Units by mouth every Monday. 07/31/19  Yes [provider]  nitrofurantoin (MACRODANTIN) 50 MG capsule Take 50 mg by mouth daily as needed (urinary tract infection.).    [provider]     Review of Systems  Positive ROS: As above  All other systems have been reviewed  and were otherwise negative with the exception of those mentioned in the HPI and as above.  Objective: Vital signs in last 24 hours: Temp:  [97.9 F (36.6 C)] 97.9 F (36.6 C) (08/26 0606) Pulse Rate:  [71] 71 (08/26 0606) Resp:  [18] 18 (08/26 0606) BP: (167)/(78) 167/78 (08/26 0606) SpO2:  [99 %] 99 % (08/26 0606) Weight:  [78.7 kg] 78.7 kg (08/26 0606) Estimated body mass index is 30.25 kg/m as calculated from the following:   Height as of this encounter: 5' 3.5" (1.613 m).   Weight as of this encounter: 78.7 kg.   General Appearance: Alert Head: Normocephalic, without obvious abnormality, atraumatic Eyes: PERRL, conjunctiva/corneas clear, EOM's intact,    Ears: Normal  Throat: Normal  Neck: Supple, Back: unremarkable Lungs: Clear to  auscultation bilaterally, respirations unlabored Heart: Regular rate and rhythm, no murmur, rub or gallop Abdomen: Soft, non-tender Extremities: Extremities normal, atraumatic, no cyanosis or edema Skin: unremarkable  NEUROLOGIC:   Mental status: alert and oriented,Motor Exam - grossly normal Sensory Exam - grossly normal Reflexes:  Coordination - grossly normal Gait - grossly normal Balance - grossly normal Cranial Nerves: I: smell Not tested  II: visual acuity  OS: Normal  OD: Normal   II: visual fields Full to confrontation  II: pupils Equal, round, reactive to light  III,VII: ptosis None  III,IV,VI: extraocular muscles  Full ROM  V: mastication Normal  V: facial light touch sensation  Normal  V,VII: corneal reflex  Present  VII: facial muscle function - upper  Normal  VII: facial muscle function - lower Normal  VIII: hearing Not tested  IX: soft palate elevation  Normal  IX,X: gag reflex Present  XI: trapezius strength  5/5  XI: sternocleidomastoid strength 5/5  XI: neck flexion strength  5/5  XII: tongue strength  Normal    Data Review Lab Results  Component Value Date   WBC 11.5 (H) 09/07/2019   HGB 12.9 09/07/2019   HCT 40.0 09/07/2019   MCV 94.3 09/07/2019   PLT 351 09/07/2019   Lab Results  Component Value Date   NA 132 (L) 09/07/2019   K 3.7 09/07/2019   CL 97 (L) 09/07/2019   CO2 22 09/07/2019   BUN 5 (L) 09/07/2019   CREATININE 0.58 09/07/2019   GLUCOSE 115 (H) 09/07/2019   No results found for: INR, PROTIME  Assessment/Plan: 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  I reviewed her imaging studies with her and pointed out the abnormalities.  We have discussed the various treatment options including surgery.  I have described the surgical treatment option of an L4-5 decompression, instrumentation and fusion.  I have shown her surgical models.  I  have given her a surgical pamphlet.  We have discussed the risk, benefits, alternatives, expected postoperative course, and likelihood of achieving our goals with surgery.  I have answered all questions.  She wants to proceed with surgery.   Monica Hinton 09/10/2019 7:17 AM

## 2019-09-10 NOTE — Anesthesia Procedure Notes (Addendum)
Procedure Name: Intubation Date/Time: 09/10/2019 8:06 AM Performed by: Kathryne Hitch, CRNA Pre-anesthesia Checklist: Patient identified, Emergency Drugs available, Suction available and Patient being monitored Patient Re-evaluated:Patient Re-evaluated prior to induction Oxygen Delivery Method: Circle system utilized Preoxygenation: Pre-oxygenation with 100% oxygen Induction Type: IV induction Ventilation: Mask ventilation without difficulty Laryngoscope Size: Mac and 3 Grade View: Grade II Tube type: Oral Tube size: 7.0 mm Number of attempts: 2 Airway Equipment and Method: Stylet and Oral airway Placement Confirmation: ETT inserted through vocal cords under direct vision,  positive ETCO2 and breath sounds checked- equal and bilateral Secured at: 22 cm Tube secured with: Tape Dental Injury: Teeth and Oropharynx as per pre-operative assessment

## 2019-09-10 NOTE — Progress Notes (Signed)
Pharmacy Antibiotic Note  ENAS WINCHEL is a 74 y.o. female admitted on 09/10/2019 with surgical prophylaxis.  Pharmacy has been consulted for vancomcyin dosing.  Plan: Post-op vancomycin IV 1000 mg dose x 1, no drain noted. Pharmacy will sign off  Height: 5' 3.5" (161.3 cm) Weight: 78.7 kg (173 lb 8 oz) IBW/kg (Calculated) : 53.55  Temp (24hrs), Avg:97.6 F (36.4 C), Min:97 F (36.1 C), Max:97.9 F (36.6 C)  Recent Labs  Lab 09/07/19 0853  WBC 11.5*  CREATININE 0.58    Estimated Creatinine Clearance: 64.8 mL/min (by C-G formula based on SCr of 0.58 mg/dL).    Allergies  Allergen Reactions  . Yellow Jacket Venom [Bee Venom] Anaphylaxis    Pt has EPI PEN  . Codeine Nausea And Vomiting  . Other     Lactose Intolerant    . Penicillins Hives    Many years - dr does not put pt on this  . Chlorhexidine Rash    With preop washes     Thank you for allowing [place] to participate in this patients care.   Jens Som, PharmD Please see amion for complete clinical pharmacist phone list. 09/10/2019 2:21 PM

## 2019-09-10 NOTE — Transfer of Care (Signed)
Immediate Anesthesia Transfer of Care Note  Patient: Monica Hinton  Procedure(s) Performed: POSTERIOR LUMBAR INTERBODY Susa Simmonds INSTRUMENTATION, LAMINECTOMY LUMBAR FOUR - LUMBAR FIVE (N/A )  Patient Location: PACU  Anesthesia Type:General  Level of Consciousness: drowsy, patient cooperative and responds to stimulation  Airway & Oxygen Therapy: Patient Spontanous Breathing and Patient connected to face mask oxygen  Post-op Assessment: Report given to RN and Post -op Vital signs reviewed and stable  Post vital signs: Reviewed and stable  Last Vitals:  Vitals Value Taken Time  BP 161/79   Temp 97.3   Pulse 88   Resp 13   SpO2 100      Last Pain:  Vitals:   09/10/19 0638  TempSrc:   PainSc: 0-No pain      Patients Stated Pain Goal: 2 (88/71/95 9747)  Complications: No complications documented.

## 2019-09-10 NOTE — Op Note (Signed)
Brief history: The patient is a 74 year old white female who has complained of back and right greater left leg pain consistent with neurogenic claudication.  She has failed medical management and was worked up with lumbar x-rays and a lumbar MRI which demonstrated an L4-5 spondylolisthesis, facet arthropathy and severe stenosis.  I discussed the various treatment options with her.  She has decided to proceed with surgery after weighing the risks, benefits and alternatives.  Preoperative diagnosis: L4-5 spinal listhesis, facet arthropathy, degenerative disc disease, spinal stenosis compressing both the L4 and the L5 nerve roots; lumbago; lumbar radiculopathy; neurogenic claudication  Postoperative diagnosis: The same  Procedure: Bilateral L4-5 laminotomy/foraminotomies/medial facetectomy to decompress the bilateral L4 and L5 nerve roots(the work required to do this was in addition to the work required to do the posterior lumbar interbody fusion because of the patient's spinal stenosis, facet arthropathy. Etc. requiring a wide decompression of the nerve roots.);  L4-5 transforaminal lumbar interbody fusion with local morselized autograft bone and Zimmer DBM; insertion of interbody prosthesis at L4-5 (globus peek expandable interbody prosthesis); posterior nonsegmental instrumentation from L4 to L5 with globus titanium pedicle screws and rods; posterior lateral arthrodesis at L4-5 with local morselized autograft bone and Zimmer DBM.  Surgeon: Dr. Earle Gell  Asst.: Dr. Pieter Partridge Dawley  Anesthesia: Gen. endotracheal  Estimated blood loss: 200 cc  Drains: None  Complications: None  Description of procedure: The patient was brought to the operating room by the anesthesia team. General endotracheal anesthesia was induced. The patient was turned to the prone position on the Wilson frame. The patient's lumbosacral region was then prepared with Betadine scrub and Betadine solution. Sterile drapes were  applied.  I then injected the area to be incised with Marcaine with epinephrine solution. I then used the scalpel to make a linear midline incision over the L4-5 interspace. I then used electrocautery to perform a bilateral subperiosteal dissection exposing the spinous process and lamina of L4 and L5. We then obtained intraoperative radiograph to confirm our location. We then inserted the Verstrac retractor to provide exposure.  I began the decompression by using the high speed drill to perform laminotomies at L4-5 bilaterally. We then used the Kerrison punches to widen the laminotomy and removed the ligamentum flavum at L4-5 bilaterally. We used the Kerrison punches to remove the medial facets at L4-5 bilaterally, we removed the facet at L4-5 on the right.. We performed wide foraminotomies about the bilateral L4 and L5 nerve roots completing the decompression.  We now turned our attention to the posterior lumbar interbody fusion. I used a scalpel to incise the intervertebral disc at L4-5 bilaterally. I then performed a partial intervertebral discectomy at L4-5 bilaterally using the pituitary forceps. We prepared the vertebral endplates at U0-4 bilaterally for the fusion by removing the soft tissues with the curettes. We then used the trial spacers to pick the appropriate sized interbody prosthesis. We prefilled his prosthesis with a combination of local morselized autograft bone that we obtained during the decompression as well as Zimmer DBM. We inserted the prefilled prosthesis into the interspace at L4-5 from the right, we then turned and expanded the prosthesis. There was a good snug fit of the prosthesis in the interspace. We then filled and the remainder of the intervertebral disc space with local morselized autograft bone and Zimmer DBM. This completed the posterior lumbar interbody arthrodesis.  During the decompression and insertion of the prosthesis the assistant protected the thecal sac and nerve  roots with the D'Errico retractor.  We now turned attention to the instrumentation. Under fluoroscopic guidance we cannulated the bilateral L4 and L5 pedicles with the bone probe. We then removed the bone probe. We then tapped the pedicle with a 5.5 millimeter tap. We then removed the tap. We probed inside the tapped pedicle with a ball probe to rule out cortical breaches. We then inserted a 6.5 x 40 and 45 millimeter pedicle screw into the L4 and L5 pedicles bilaterally under fluoroscopic guidance. We then palpated along the medial aspect of the pedicles to rule out cortical breaches. There were none. The nerve roots were not injured. We then connected the unilateral pedicle screws with a lordotic rod. We compressed the construct and secured the rod in place with the caps. We then tightened the caps appropriately. This completed the instrumentation from L4-5 bilaterally.  We now turned our attention to the posterior lateral arthrodesis at L4-5 bilaterally. We used the high-speed drill to decorticate the remainder of the facets, pars, transverse process at L4-5 bilaterally. We then applied a combination of local morselized autograft bone and Zimmer DBM over these decorticated posterior lateral structures. This completed the posterior lateral arthrodesis.  We then obtained hemostasis using bipolar electrocautery. We irrigated the wound out with bacitracin solution. We inspected the thecal sac and nerve roots and noted they were well decompressed. We then removed the retractor.  We injected Exparel . We reapproximated patient's thoracolumbar fascia with interrupted #1 Vicryl suture. We reapproximated patient's subcutaneous tissue with interrupted 2-0 Vicryl suture. The reapproximated patient's skin with Steri-Strips and benzoin. The wound was then coated with bacitracin ointment. A sterile dressing was applied. The drapes were removed. The patient was subsequently returned to the supine position where they were  extubated by the anesthesia team. He was then transported to the post anesthesia care unit in stable condition. All sponge instrument and needle counts were reportedly correct at the end of this case.

## 2019-09-10 NOTE — Progress Notes (Signed)
Subjective: The patient is somnolent but arousable.  She is in no apparent distress.  She looks well.  Objective: Vital signs in last 24 hours: Temp:  [97.9 F (36.6 C)] 97.9 F (36.6 C) (08/26 1132) Pulse Rate:  [71-74] 74 (08/26 1132) Resp:  [14-18] 14 (08/26 1132) BP: (161-167)/(78-79) 161/79 (08/26 1132) SpO2:  [99 %] 99 % (08/26 0606) Weight:  [78.7 kg] 78.7 kg (08/26 0606) Estimated body mass index is 30.25 kg/m as calculated from the following:   Height as of this encounter: 5' 3.5" (1.613 m).   Weight as of this encounter: 78.7 kg.   Intake/Output from previous day: No intake/output data recorded. Intake/Output this shift: Total I/O In: 1200 [I.V.:1200] Out: 650 [Urine:550; Blood:100]  Physical exam the patient is somnolent but arousable.  She is moving her lower extremities well.  Lab Results: No results for input(s): WBC, HGB, HCT, PLT in the last 72 hours. BMET No results for input(s): NA, K, CL, CO2, GLUCOSE, BUN, CREATININE, CALCIUM in the last 72 hours.  Studies/Results: DG Lumbar Spine 2-3 Views  Result Date: 09/10/2019 CLINICAL DATA:  Posterior lumbar interbody fusion L4-5 EXAM: LUMBAR SPINE - 2-3 VIEW; DG C-ARM 1-60 MIN COMPARISON:  Lumbar MRI 02/11/2018 FINDINGS: AP and lateral C-arm images obtained in the operating room. Bilateral pedicle screws are present at L4 and L5. Posterior rods not yet placed. Interbody spacer in good position in the midline. IMPRESSION: PLIF L4-5. Electronically Signed   By: Franchot Gallo M.D.   On: 09/10/2019 11:16   DG Lumbar Spine 1 View  Result Date: 09/10/2019 CLINICAL DATA:  L4-5 fusion procedure. EXAM: LUMBAR SPINE - 1 VIEW COMPARISON:  MRI 02/11/2018 FINDINGS: Single film shows a probe directed at the pedicle level of L4. IMPRESSION: L4 pedicle level localized. Electronically Signed   By: Nelson Chimes M.D.   On: 09/10/2019 10:35   DG C-Arm 1-60 Min  Result Date: 09/10/2019 CLINICAL DATA:  Posterior lumbar interbody  fusion L4-5 EXAM: LUMBAR SPINE - 2-3 VIEW; DG C-ARM 1-60 MIN COMPARISON:  Lumbar MRI 02/11/2018 FINDINGS: AP and lateral C-arm images obtained in the operating room. Bilateral pedicle screws are present at L4 and L5. Posterior rods not yet placed. Interbody spacer in good position in the midline. IMPRESSION: PLIF L4-5. Electronically Signed   By: Franchot Gallo M.D.   On: 09/10/2019 11:16    Assessment/Plan: The patient is doing well.  I spoke with her husband.  LOS: 0 days     Monica Hinton 09/10/2019, 11:59 AM

## 2019-09-10 NOTE — Anesthesia Postprocedure Evaluation (Signed)
Anesthesia Post Note  Patient: Monica Hinton  Procedure(s) Performed: POSTERIOR LUMBAR INTERBODY FUSION,POSTERIOR INSTRUMENTATION, LAMINECTOMY LUMBAR FOUR - LUMBAR FIVE (N/A )     Patient location during evaluation: PACU Anesthesia Type: General Level of consciousness: awake and alert, oriented and patient cooperative Pain management: pain level controlled Vital Signs Assessment: post-procedure vital signs reviewed and stable Respiratory status: spontaneous breathing, nonlabored ventilation and respiratory function stable Cardiovascular status: blood pressure returned to baseline and stable Postop Assessment: no apparent nausea or vomiting Anesthetic complications: no   No complications documented.  Last Vitals:  Vitals:   09/10/19 1252 09/10/19 1319  BP:  (!) 159/86  Pulse: 78 67  Resp: 12 18  Temp: (!) 36.1 C 36.4 C  SpO2: 98% 97%    Last Pain:  Vitals:   09/10/19 1400  TempSrc:   PainSc: [place]

## 2019-09-10 NOTE — Anesthesia Preprocedure Evaluation (Addendum)
Anesthesia Evaluation  Patient identified by MRN, date of birth, ID band Patient awake    Reviewed: Allergy & Precautions, NPO status , Patient's Chart, lab work & pertinent test results  Airway Mallampati: II  TM Distance: >3 FB Neck ROM: Full    Dental  (+) Teeth Intact, Dental Advisory Given   Pulmonary asthma (seasonal) , former smoker,    Pulmonary exam normal breath sounds clear to auscultation       Cardiovascular hypertension, Pt. on medications + Peripheral Vascular Disease  Normal cardiovascular exam Rhythm:Regular Rate:Normal     Neuro/Psych negative neurological ROS  negative psych ROS   GI/Hepatic Neg liver ROS, GERD  Controlled,  Endo/Other  Hypothyroidism Obesity BMI 30  Renal/GU negative Renal ROS  negative genitourinary   Musculoskeletal Lumbar spondylolisthesis    Abdominal   Peds  Hematology negative hematology ROS (+) hct 40, plt 351   Anesthesia Other Findings   Reproductive/Obstetrics negative OB ROS                            Anesthesia Physical Anesthesia Plan  ASA: II  Anesthesia Plan: General   Post-op Pain Management:    Induction: Intravenous  PONV Risk Score and Plan: 3 and Ondansetron, Dexamethasone and Treatment may vary due to age or medical condition  Airway Management Planned: Oral ETT  Additional Equipment: None  Intra-op Plan:   Post-operative Plan: Extubation in OR  Informed Consent: I have reviewed the patients History and Physical, chart, labs and discussed the procedure including the risks, benefits and alternatives for the proposed anesthesia with the patient or authorized representative who has indicated his/her understanding and acceptance.     Dental advisory given  Plan Discussed with: CRNA  Anesthesia Plan Comments:         Anesthesia Quick Evaluation

## 2019-09-10 NOTE — Progress Notes (Signed)
Orthopedic Tech Progress Note Patient Details:  Monica Hinton [birth year] [id number] Called In brace Patient ID: Monica Hinton, female   DOB: 1945-07-03, 74 y.o.   MRN: [id number]   [name] 09/10/2019, 2:41 PM

## 2019-09-11 LAB — BASIC METABOLIC PANEL
Anion gap: 12 (ref 5–15)
BUN: 5 mg/dL — ABNORMAL LOW (ref 8–23)
CO2: 23 mmol/L (ref 22–32)
Calcium: 8.4 mg/dL — ABNORMAL LOW (ref 8.9–10.3)
Chloride: 95 mmol/L — ABNORMAL LOW (ref 98–111)
Creatinine, Ser: 0.62 mg/dL (ref 0.44–1.00)
GFR calc Af Amer: >60 mL/min (ref >60)
GFR calc non Af Amer: >60 mL/min (ref >60)
Glucose, Bld: 117 mg/dL — ABNORMAL HIGH (ref 70–99)
Potassium: 3.5 mmol/L (ref 3.5–5.1)
Sodium: 130 mmol/L — ABNORMAL LOW (ref 135–145)

## 2019-09-11 LAB — CBC
HCT: 31.1 % — ABNORMAL LOW (ref 36.0–46.0)
Hemoglobin: 10 g/dL — ABNORMAL LOW (ref 12.0–15.0)
MCH: 30.3 pg (ref 26.0–34.0)
MCHC: 32.2 g/dL (ref 30.0–36.0)
MCV: 94.2 fL (ref 80.0–100.0)
Platelets: 273 10*3/uL (ref 150–400)
RBC: 3.3 MIL/uL — ABNORMAL LOW (ref 3.87–5.11)
RDW: 11.9 % (ref 11.5–15.5)
WBC: 19.5 10*3/uL — ABNORMAL HIGH (ref 4.0–10.5)
nRBC: 0 % (ref 0.0–0.2)

## 2019-09-11 MED ORDER — DOCUSATE SODIUM 100 MG PO CAPS: 100.0000 mg | ORAL_CAPSULE | Freq: Two times a day (BID) | ORAL | 0 refills | Status: DC

## 2019-09-11 MED ORDER — HYDROMORPHONE HCL 2 MG PO TABS: 2.0000 mg | ORAL_TABLET | Freq: Four times a day (QID) | ORAL | 0 refills | Status: DC | PRN

## 2019-09-11 MED ORDER — CYCLOBENZAPRINE HCL 10 MG PO TABS: 10.0000 mg | ORAL_TABLET | Freq: Three times a day (TID) | ORAL | 0 refills | Status: DC | PRN

## 2019-09-11 NOTE — Discharge Summary (Signed)
Physician Discharge Summary     Providing Compassionate, Quality Care - Together   Patient ID: Monica Hinton MRN: [id number] DOB/AGE: 24-Oct-1945 74 y.o.  Admit date: 09/10/2019 Discharge date: 09/11/2019  Admission Diagnoses: Spondylolisthesis of lumbar region  Discharge Diagnoses:  Active Problems:   Spondylolisthesis of lumbar region   Discharged Condition: good  Hospital Course: Patient underwent an L4-5 posterior fusion by Dr. Arnoldo Morale on 09/10/2019. He was admitted to 3C04 overnight for observation. Her postoperative course has been uncomplicated. She has worked with both physical and occupational therapies who feel the patient is ready for discharge home. She is ambulating independently and without difficulty. She is tolerating a normal diet. She is not having any bowel or bladder dysfunction. Her pain is well-controlled with oral pain medication. She is ready for discharge home.   Consults: rehabilitation medicine  Significant Diagnostic Studies: radiology: DG Lumbar Spine 2-3 Views  Result Date: 09/10/2019 CLINICAL DATA:  Posterior lumbar interbody fusion L4-5 EXAM: LUMBAR SPINE - 2-3 VIEW; DG C-ARM 1-60 MIN COMPARISON:  Lumbar MRI 02/11/2018 FINDINGS: AP and lateral C-arm images obtained in the operating room. Bilateral pedicle screws are present at L4 and L5. Posterior rods not yet placed. Interbody spacer in good position in the midline. IMPRESSION: PLIF L4-5. Electronically Signed   By: Franchot Gallo M.D.   On: 09/10/2019 11:16   DG Lumbar Spine 1 View  Result Date: 09/10/2019 CLINICAL DATA:  L4-5 fusion procedure. EXAM: LUMBAR SPINE - 1 VIEW COMPARISON:  MRI 02/11/2018 FINDINGS: Single film shows a probe directed at the pedicle level of L4. IMPRESSION: L4 pedicle level localized. Electronically Signed   By: Nelson Chimes M.D.   On: 09/10/2019 10:35   DG C-Arm 1-60 Min  Result Date: 09/10/2019 CLINICAL DATA:  Posterior lumbar interbody fusion L4-5 EXAM: LUMBAR SPINE -  2-3 VIEW; DG C-ARM 1-60 MIN COMPARISON:  Lumbar MRI 02/11/2018 FINDINGS: AP and lateral C-arm images obtained in the operating room. Bilateral pedicle screws are present at L4 and L5. Posterior rods not yet placed. Interbody spacer in good position in the midline. IMPRESSION: PLIF L4-5. Electronically Signed   By: Franchot Gallo M.D.   On: 09/10/2019 11:16     Treatments: surgery: Bilateral L4-5 laminotomy/foraminotomies/medial facetectomy to decompress the bilateral L4 and L5 nerve roots(the work required to do this was in addition to the work required to do the posterior lumbar interbody fusion because of the patient's spinal stenosis, facet arthropathy. Etc. requiring a wide decompression of the nerve roots.);  L4-5 transforaminal lumbar interbody fusion with local morselized autograft bone and Zimmer DBM; insertion of interbody prosthesis at L4-5 (globus peek expandable interbody prosthesis); posterior nonsegmental instrumentation from L4 to L5 with globus titanium pedicle screws and rods; posterior lateral arthrodesis at L4-5 with local morselized autograft bone and Zimmer DBM.  Discharge Exam: Blood pressure 127/70, pulse 78, temperature 98.2 F (36.8 C), temperature source Oral, resp. rate 18, height 5' 3.5" (1.613 m), weight 78.7 kg, SpO2 96 %.  Per report: Alert and oriented x 4 PERRLA CN II-XII grossly intact MAE, Strength and sensation intact Incision is covered with Honeycomb dressing and Steri Strips; Dressing is clean, dry, and intact   Disposition: Discharge disposition: 01-Home or Self Care        Allergies as of 09/11/2019      Reactions   Yellow Jacket Venom [bee Venom] Anaphylaxis   Pt has EPI PEN   Codeine Nausea And Vomiting   Other    Lactose Intolerant  Penicillins Hives   Many years - dr does not put pt on this   Chlorhexidine Rash   With preop washes      Medication List    TAKE these medications   acetaminophen 500 MG tablet Commonly known as:  TYLENOL Take 1,000 mg by mouth in the morning, at noon, in the evening, and at bedtime.   cyclobenzaprine 10 MG tablet Commonly known as: FLEXERIL Take 1 tablet (10 mg total) by mouth 3 (three) times daily as needed for muscle spasms.   docusate sodium 100 MG capsule Commonly known as: COLACE Take 1 capsule (100 mg total) by mouth 2 (two) times daily.   estradiol 1 MG tablet Commonly known as: ESTRACE Take 1 mg by mouth daily.   gabapentin 300 MG capsule Commonly known as: NEURONTIN Take 600 mg by mouth in the morning, at noon, in the evening, and at bedtime.   hydrochlorothiazide 12.5 MG tablet Commonly known as: HYDRODIURIL Take 12.5 mg by mouth daily.   HYDROmorphone 2 MG tablet Commonly known as: DILAUDID Take 1-2 tablets (2-4 mg total) by mouth every 6 (six) hours as needed for moderate pain.   levothyroxine 112 MCG tablet Commonly known as: SYNTHROID Take 112 mcg by mouth daily before breakfast.   montelukast 10 MG tablet Commonly known as: SINGULAIR Take 10 mg by mouth at bedtime.   nitrofurantoin 50 MG capsule Commonly known as: MACRODANTIN Take 50 mg by mouth daily as needed (urinary tract infection.).   RABEprazole 20 MG tablet Commonly known as: ACIPHEX Take 20 mg by mouth daily before breakfast.   rosuvastatin 10 MG tablet Commonly known as: CRESTOR Take 10 mg by mouth daily.   Vitamin D (Ergocalciferol) 1.25 MG (50000 UNIT) Caps capsule Commonly known as: DRISDOL Take 50,000 Units by mouth every Monday.            Durable Medical Equipment  (From admission, onward)         Start     Ordered   09/11/19 1127  For home use only DME 3 n 1  Once        09/11/19 1130          Follow-up Information    Newman Pies, MD. Schedule an appointment as soon as possible for a visit in 2 week(s).   Specialty: Neurosurgery Contact information: [address] [place] [address] [place] [address] [phone]                Signed: Patricia Nettle 09/11/2019, 11:31 AM

## 2019-09-11 NOTE — Evaluation (Signed)
Physical Therapy Evaluation Patient Details Name: Monica Hinton MRN: [id number] DOB: Mar 22, 1945 Today's Date: 09/11/2019   History of Present Illness  Pt is a 74 year old woman admitted for L4-5 decompression and fusion. PMH: asthma, HTN, PVD, hypothyroidism, hip sx in 2018.    Clinical Impression  Pt admitted with above diagnosis. At the time of PT eval, pt was able to demonstrate transfers and ambulation with up to min guard assist and no AD. Pt was educated on precautions, brace application/wearing schedule, appropriate activity progression, and car transfer. Pt currently with functional limitations due to the deficits listed below (see PT Problem List). Pt will benefit from skilled PT to increase their independence and safety with mobility to allow discharge to the venue listed below.      Follow Up Recommendations No PT follow up;Supervision for mobility/OOB    Equipment Recommendations  None recommended by PT    Recommendations for Other Services       Precautions / Restrictions Precautions Precautions: Back Precaution Booklet Issued: Yes (comment) Precaution Comments: reviewed back precautions with pt and husband Required Braces or Orthoses: Spinal Brace Spinal Brace: Applied in sitting position Restrictions Weight Bearing Restrictions: No      Mobility  Bed Mobility Overal bed mobility: Needs Assistance Bed Mobility: Rolling;Sidelying to Sit;Sit to Sidelying Rolling: Supervision Sidelying to sit: Supervision     Sit to sidelying: Supervision General bed mobility comments: Pt was received ambulating in the hallway.   Transfers Overall transfer level: Needs assistance   Transfers: Sit to/from Stand Sit to Stand: Supervision         General transfer comment: supervision for safety; VC's for more upright posture, wide BOS, and reaching back for seated surface for safety.   Ambulation/Gait Ambulation/Gait assistance: Min guard;Supervision Gait Distance  (Feet): 350 Feet Assistive device: Rolling walker (2 wheeled);None;Straight cane Gait Pattern/deviations: Decreased stride length;Wide base of support;Trunk flexed Gait velocity: Decreased Gait velocity interpretation: 1.31 - 2.62 ft/sec, indicative of limited community ambulator General Gait Details: Pt with almost waddling gait pattern. Reports this was due to pain PTA and now just a habit. Unable to make corrective changes with VC's. Initially with RW but progressed to no AD.   Stairs            Wheelchair Mobility    Modified Rankin (Stroke Patients Only)       Balance Overall balance assessment: Needs assistance Sitting-balance support: Feet supported;No upper extremity supported Sitting balance-Leahy Scale: Fair     Standing balance support: No upper extremity supported;During functional activity Standing balance-Leahy Scale: Fair                               Pertinent Vitals/Pain Pain Assessment: Faces Faces Pain Scale: Hurts even more Pain Location: back Pain Descriptors / Indicators: Sore;Grimacing;Guarding Pain Intervention(s): Limited activity within patient's tolerance;Monitored during session;Repositioned    Home Living Family/patient expects to be discharged to:: Private residence Living Arrangements: Spouse/significant other Available Help at Discharge: Family;Available 24 hours/day Type of Home: House Home Access: Stairs to enter Entrance Stairs-Rails: None Entrance Stairs-Number of Steps: 1 stair that they have made into 2 stairs with an aerobics step Home Layout: Two level;Able to live on main level with bedroom/bathroom Home Equipment: Gilford Rile - 2 wheels;Cane - single point;Adaptive equipment      Prior Function Level of Independence: Needs assistance      ADL's / Homemaking Assistance Needed: assisted for housekeeping  Hand Dominance   Dominant Hand: Right    Extremity/Trunk Assessment   Upper Extremity  Assessment Upper Extremity Assessment: Overall WFL for tasks assessed    Lower Extremity Assessment Lower Extremity Assessment: Generalized weakness    Cervical / Trunk Assessment Cervical / Trunk Assessment: Other exceptions Cervical / Trunk Exceptions: s/p back sx  Communication   Communication: No difficulties  Cognition Arousal/Alertness: Awake/alert Behavior During Therapy: WFL for tasks assessed/performed Overall Cognitive Status: Within Functional Limits for tasks assessed                                        General Comments      Exercises     Assessment/Plan    PT Assessment Patient needs continued PT services  PT Problem List Decreased strength;Decreased activity tolerance;Decreased balance;Decreased mobility;Decreased knowledge of use of DME;Decreased safety awareness;Decreased knowledge of precautions;Pain       PT Treatment Interventions DME instruction;Gait training;Functional mobility training;Therapeutic activities;Therapeutic exercise;Neuromuscular re-education;Patient/family education;Stair training    PT Goals (Current goals can be found in the Care Plan section)  Acute Rehab PT Goals Patient Stated Goal: return home PT Goal Formulation: With patient/family Time For Goal Achievement: 09/18/19 Potential to Achieve Goals: Good    Frequency Min 5X/week   Barriers to discharge        Co-evaluation               AM-PAC PT "6 Clicks" Mobility  Outcome Measure Help needed turning from your back to your side while in a flat bed without using bedrails?: None Help needed moving from lying on your back to sitting on the side of a flat bed without using bedrails?: None Help needed moving to and from a bed to a chair (including a wheelchair)?: None Help needed standing up from a chair using your arms (e.g., wheelchair or bedside chair)?: None Help needed to walk in hospital room?: None Help needed climbing 3-5 steps with a railing?  : A Little 6 Click Score: 23    End of Session Equipment Utilized During Treatment: Gait belt;Back brace Activity Tolerance: Patient tolerated treatment well Patient left: with call bell/phone within reach;with family/visitor present (Sitting EOB awaiting OT) Nurse Communication: Mobility status PT Visit Diagnosis: Unsteadiness on feet (R26.81);Pain Pain - part of body:  (back)    Time: 0539-7673 PT Time Calculation (min) (ACUTE ONLY): 26 min   Charges:   PT Evaluation $PT Eval Low Complexity: 1 Low PT Treatments $Gait Training: 8-22 mins        Rolinda Roan, PT, DPT Acute Rehabilitation Services Pager: [phone] Office: [phone]   Thelma Comp 09/11/2019, 12:49 PM

## 2019-09-11 NOTE — Progress Notes (Signed)
Subjective: The patient is alert and pleasant.  She looks well.  She wants to work with PT today before she decides whether she wants to go home.  Objective: Vital signs in last 24 hours: Temp:  [97 F (36.1 C)-98.5 F (36.9 C)] 98.5 F (36.9 C) (08/27 0419) Pulse Rate:  [67-93] 87 (08/27 0419) Resp:  [7-18] 18 (08/27 0419) BP: (124-161)/(67-86) 125/67 (08/27 0419) SpO2:  [95 %-99 %] 96 % (08/27 0419) Estimated body mass index is 30.25 kg/m as calculated from the following:   Height as of this encounter: 5' 3.5" (1.613 m).   Weight as of this encounter: 78.7 kg.   Intake/Output from previous day: 08/26 0701 - 08/27 0700 In: 1425 [P.O.:75; I.V.:1350] Out: 750 [Urine:650; Blood:100] Intake/Output this shift: No intake/output data recorded.  Physical exam the patient is alert and oriented.  Her dressing is clean and dry.  Her lower extremity strength is normal.  Lab Results: No results for input(s): WBC, HGB, HCT, PLT in the last 72 hours. BMET No results for input(s): NA, K, CL, CO2, GLUCOSE, BUN, CREATININE, CALCIUM in the last 72 hours.  Studies/Results: DG Lumbar Spine 2-3 Views  Result Date: 09/10/2019 CLINICAL DATA:  Posterior lumbar interbody fusion L4-5 EXAM: LUMBAR SPINE - 2-3 VIEW; DG C-ARM 1-60 MIN COMPARISON:  Lumbar MRI 02/11/2018 FINDINGS: AP and lateral C-arm images obtained in the operating room. Bilateral pedicle screws are present at L4 and L5. Posterior rods not yet placed. Interbody spacer in good position in the midline. IMPRESSION: PLIF L4-5. Electronically Signed   By: Franchot Gallo M.D.   On: 09/10/2019 11:16   DG Lumbar Spine 1 View  Result Date: 09/10/2019 CLINICAL DATA:  L4-5 fusion procedure. EXAM: LUMBAR SPINE - 1 VIEW COMPARISON:  MRI 02/11/2018 FINDINGS: Single film shows a probe directed at the pedicle level of L4. IMPRESSION: L4 pedicle level localized. Electronically Signed   By: Nelson Chimes M.D.   On: 09/10/2019 10:35   DG C-Arm 1-60  Min  Result Date: 09/10/2019 CLINICAL DATA:  Posterior lumbar interbody fusion L4-5 EXAM: LUMBAR SPINE - 2-3 VIEW; DG C-ARM 1-60 MIN COMPARISON:  Lumbar MRI 02/11/2018 FINDINGS: AP and lateral C-arm images obtained in the operating room. Bilateral pedicle screws are present at L4 and L5. Posterior rods not yet placed. Interbody spacer in good position in the midline. IMPRESSION: PLIF L4-5. Electronically Signed   By: Franchot Gallo M.D.   On: 09/10/2019 11:16    Assessment/Plan: Postop day #1: The patient will probably go home later on today.  I gave her her discharge instructions and answered all her questions.  LOS: 1 day     Ophelia Charter 09/11/2019, 6:37 AM

## 2019-09-11 NOTE — Evaluation (Addendum)
Occupational Therapy Evaluation Patient Details Name: Monica Hinton MRN: [id number] DOB: 1945-11-14 Today's Date: 09/11/2019    History of Present Illness Pt is a 74 year old woman admitted for L4-5 decompression and fusion. PMH: asthma, HTN, PVD, hypothyroidism, hip sx in 2018.   Clinical Impression   Pt was independent in self care and was using cane or walker intermittently leading up to sx. She is assisted for housekeeping at baseline. Pt is overall functioning at a supervision level in ADL and mobility. All education completed with pt and husband verbalizing and/or demonstrating understanding.     Follow Up Recommendations  No OT follow up    Equipment Recommendations  3 in 1 bedside commode    Recommendations for Other Services       Precautions / Restrictions Precautions Precautions: Back Precaution Booklet Issued: Yes (comment) Precaution Comments: reviewed back precautions with pt and husband Required Braces or Orthoses: Spinal Brace Spinal Brace: Applied in supine position Restrictions Weight Bearing Restrictions: No      Mobility Bed Mobility Overal bed mobility: Needs Assistance Bed Mobility: Rolling;Sidelying to Sit;Sit to Sidelying Rolling: Supervision Sidelying to sit: Supervision     Sit to sidelying: Supervision General bed mobility comments: supervision for log roll technique  Transfers Overall transfer level: Needs assistance   Transfers: Sit to/from Stand Sit to Stand: Supervision         General transfer comment: supervision for safety    Balance Overall balance assessment: Needs assistance   Sitting balance-Leahy Scale: Good       Standing balance-Leahy Scale: Fair                             ADL either performed or assessed with clinical judgement   ADL Overall ADL's : Needs assistance/impaired Eating/Feeding: Independent   Grooming: Supervision/safety;Standing Grooming Details (indicate cue type and reason):  educated in 2 cup method for toothbrushing  Upper Body Bathing: Minimal assistance;Sitting Upper Body Bathing Details (indicate cue type and reason): recommended long handled bath sponge for back and feet Lower Body Bathing: Set up;Sitting/lateral leans   Upper Body Dressing : Set up;Sitting   Lower Body Dressing: Supervision/safety;Sit to/from stand   Toilet Transfer: Min guard;Ambulation   Toileting- Clothing Manipulation and Hygiene: Supervision/safety;Sit to/from stand Toileting - Clothing Manipulation Details (indicate cue type and reason): instructed to avoid twisting with pericare and compensatory technique     Functional mobility during ADLs: Min guard General ADL Comments: Educated in IADL to avoid.     Vision Patient Visual Report: No change from baseline       Perception     Praxis      Pertinent Vitals/Pain Pain Assessment: Faces Faces Pain Scale: Hurts even more Pain Location: back Pain Descriptors / Indicators: Sore;Grimacing;Guarding Pain Intervention(s): Monitored during session;Repositioned     Hand Dominance Right   Extremity/Trunk Assessment Upper Extremity Assessment Upper Extremity Assessment: Overall WFL for tasks assessed   Lower Extremity Assessment Lower Extremity Assessment: Defer to PT evaluation   Cervical / Trunk Assessment Cervical / Trunk Assessment: Other exceptions Cervical / Trunk Exceptions: s/p back sx   Communication Communication Communication: No difficulties   Cognition Arousal/Alertness: Awake/alert Behavior During Therapy: WFL for tasks assessed/performed Overall Cognitive Status: Within Functional Limits for tasks assessed  General Comments       Exercises     Shoulder Instructions      Home Living Family/patient expects to be discharged to:: Private residence Living Arrangements: Spouse/significant other Available Help at Discharge: Family;Available 24  hours/day Type of Home: House Home Access: Stairs to enter [employer] of Steps: 1 stair that they have made into 2 stairs with an aerobics step Entrance Stairs-Rails: None Home Layout: Two level;Able to live on main level with bedroom/bathroom     Bathroom Shower/Tub: Occupational psychologist: Standard     Home Equipment: Environmental consultant - 2 wheels;Cane - single point;Adaptive equipment Adaptive Equipment: Reacher        Prior Functioning/Environment Level of Independence: Needs assistance    ADL's / Homemaking Assistance Needed: assisted for housekeeping            OT Problem List:        OT Treatment/Interventions:      OT Goals(Current goals can be found in the care plan section) Acute Rehab OT Goals Patient Stated Goal: return home  OT Frequency:     Barriers to D/C:            Co-evaluation              AM-PAC OT "6 Clicks" Daily Activity     Outcome Measure Help from another person eating meals?: None Help from another person taking care of personal grooming?: None Help from another person toileting, which includes using toliet, bedpan, or urinal?: None Help from another person bathing (including washing, rinsing, drying)?: None Help from another person to put on and taking off regular upper body clothing?: None Help from another person to put on and taking off regular lower body clothing?: None 6 Click Score: 24   End of Session    Activity Tolerance: Patient tolerated treatment well Patient left: in bed;with call bell/phone within reach;with family/visitor present  OT Visit Diagnosis: Pain;Unsteadiness on feet (R26.81)                Time: 6060-0459 OT Time Calculation (min): 23 min Charges:  OT General Charges $OT Visit: 1 Visit OT Evaluation $OT Eval Low Complexity: 1 Low OT Treatments $Self Care/Home Management : 8-22 mins  Nestor Lewandowsky, OTR/L Acute Rehabilitation Services Pager: [phone] Office:  [phone]  Malka So 09/11/2019, 11:14 AM

## 2019-09-11 NOTE — Progress Notes (Signed)
Patient is discharged from room 3C04 at this time. Alert and in stable condition. IV site d/c'd and instructions read to patient and spouse with understanding verbalized and all questions answered. Left unit via wheelchair with all belongings at side. 

## 2019-09-14 MED FILL — Heparin Sodium (Porcine) Inj 1000 Unit/ML: INTRAMUSCULAR | Qty: 30 | Status: AC

## 2019-09-14 MED FILL — Sodium Chloride IV Soln 0.9%: INTRAVENOUS | Qty: 1000 | Status: AC

## 2019-10-08 ENCOUNTER — Ambulatory Visit (INDEPENDENT_AMBULATORY_CARE_PROVIDER_SITE_OTHER): Payer: Medicare Other | Admitting: Otolaryngology

## 2019-10-08 ENCOUNTER — Encounter (INDEPENDENT_AMBULATORY_CARE_PROVIDER_SITE_OTHER): Payer: Self-pay | Admitting: Otolaryngology

## 2019-10-08 ENCOUNTER — Other Ambulatory Visit: Payer: Self-pay

## 2019-10-08 VITALS — Temp 97.2°F

## 2019-10-08 DIAGNOSIS — R490 Dysphonia: Secondary | ICD-10-CM | POA: Diagnosis not present

## 2019-10-08 DIAGNOSIS — K219 Gastro-esophageal reflux disease without esophagitis: Secondary | ICD-10-CM | POA: Diagnosis not present

## 2019-10-08 NOTE — Progress Notes (Signed)
HPI: Monica Hinton is a 74 y.o. female who presents for evaluation of hoarseness.  Patient apparently had lumbar spinal surgery with Dr. Arnoldo Morale about a month ago.  Following the surgery she had a change in her voice and states that she has not had a normal voice since the surgery.  She has mild hoarseness in her voice in the office today. She does have history of reflux disease and takes AcipHex.  She takes this in the mornings.  Past Medical History:  Diagnosis Date   Asthma    seasonal    Bursitis    r hip    GERD (gastroesophageal reflux disease)    High cholesterol    Hypertension    Hypothyroidism    Peripheral vascular disease ([place])    bilateral LE edema ; edma in hands as well    Past Surgical History:  Procedure Laterality Date   bladder      bladder tack  20 years ago    COLONOSCOPY     EYE SURGERY Bilateral    catracts   hysterectomy      20 years    OPEN SURGICAL REPAIR OF GLUTEAL TENDON Right 09/19/2016   Procedure: Right hip bursectomy and gluteal tendon repair;  Surgeon: Gaynelle Arabian, MD;  Location: [facility];  Service: Orthopedics;  Laterality: Right;   TONSILLECTOMY     age 81    Social History   Socioeconomic History   Marital status: Married    Spouse name: Not on file   Number of children: Not on file   Years of education: Not on file   Highest education level: Not on file  Occupational History   Not on file  Tobacco Use   Smoking status: Former Smoker    Packs/day: 0.30    Years: 10.00    Pack years: 3.00   Smokeless tobacco: Never Used   Tobacco comment: 25 years ago quit   Vaping Use   Vaping Use: Never used  Substance and Sexual Activity   Alcohol use: Yes    Comment: occasionally wine    Drug use: No   Sexual activity: Not on file  Other Topics Concern   Not on file  Social History Narrative   Not on file   Social Determinants of Health   Financial Resource Strain:    Difficulty of Paying Living  Expenses: Not on file  Food Insecurity:    Worried About [place] in the Last Year: Not on file   Ran Out of Food in the Last Year: Not on file  Transportation Needs:    Lack of Transportation (Medical): Not on file   Lack of Transportation (Non-Medical): Not on file  Physical Activity:    Days of Exercise per Week: Not on file   Minutes of Exercise per Session: Not on file  Stress:    Feeling of Stress : Not on file  Social Connections:    Frequency of Communication with Friends and Family: Not on file   Frequency of Social Gatherings with Friends and Family: Not on file   Attends Religious Services: Not on file   Active Member of Clubs or Organizations: Not on file   Attends Archivist Meetings: Not on file   Marital Status: Not on file   No family history on file. Allergies  Allergen Reactions   Yellow Jacket Venom [Bee Venom] Anaphylaxis    Pt has EPI PEN   Codeine Nausea And Vomiting  Other     Lactose Intolerant     Penicillins Hives    Many years - dr does not put pt on this   Chlorhexidine Rash    With preop washes   Prior to Admission medications   Medication Sig Start Date End Date Taking? Authorizing Provider  acetaminophen (TYLENOL) 500 MG tablet Take 1,000 mg by mouth in the morning, at noon, in the evening, and at bedtime.   Yes [provider]  cyclobenzaprine (FLEXERIL) 10 MG tablet Take 1 tablet (10 mg total) by mouth 3 (three) times daily as needed for muscle spasms. 09/11/19  Yes Viona Gilmore D, NP  docusate sodium (COLACE) 100 MG capsule Take 1 capsule (100 mg total) by mouth 2 (two) times daily. 09/11/19  Yes Viona Gilmore D, NP  estradiol (ESTRACE) 1 MG tablet Take 1 mg by mouth daily. 08/07/19  Yes [provider]  gabapentin (NEURONTIN) 300 MG capsule Take 600 mg by mouth in the morning, at noon, in the evening, and at bedtime. 06/08/19  Yes [provider]  hydrochlorothiazide  (HYDRODIURIL) 12.5 MG tablet Take 12.5 mg by mouth daily. 06/18/19  Yes [provider]  HYDROmorphone (DILAUDID) 2 MG tablet Take 1-2 tablets (2-4 mg total) by mouth every 6 (six) hours as needed for moderate pain. 09/11/19  Yes Viona Gilmore D, NP  levothyroxine (SYNTHROID) 112 MCG tablet Take 112 mcg by mouth daily before breakfast. 06/29/19  Yes [provider]  montelukast (SINGULAIR) 10 MG tablet Take 10 mg by mouth at bedtime.   Yes [provider]  nitrofurantoin (MACRODANTIN) 50 MG capsule Take 50 mg by mouth daily as needed (urinary tract infection.).   Yes [provider]  RABEprazole (ACIPHEX) 20 MG tablet Take 20 mg by mouth daily before breakfast.    Yes [provider]  rosuvastatin (CRESTOR) 10 MG tablet Take 10 mg by mouth daily.    Yes [provider]  Vitamin D, Ergocalciferol, (DRISDOL) 1.25 MG (50000 UNIT) CAPS capsule Take 50,000 Units by mouth every Monday. 07/31/19  Yes [provider]     Positive ROS: Otherwise negative  All other systems have been reviewed and were otherwise negative with the exception of those mentioned in the HPI and as above.  Physical Exam: Constitutional: Alert, well-appearing, no acute distress.  She has minimal raspiness or hoarseness. Ears: External ears without lesions or tenderness. Ear canals are clear bilaterally with intact, clear TMs.  Nasal: External nose without lesions. Septum slightly deviated to the left with mild rhinitis. Clear nasal passages Oral: Lips and gums without lesions. Tongue and palate mucosa without lesions. Posterior oropharynx clear. Fiberoptic laryngoscopy was performed to the right nostril.  The nasopharynx was clear.  The base of tongue was clear.  The epiglottis was normal.  The vocal cords were clear bilaterally with no specific vocal cord lesions.  She had mild senile changes of the vocal cords with mild bowing but no specific vocal cord lesions noted  and both cords had symmetric mobility.  Minimal arytenoid edema but no mucosal lesions. Neck: No palpable adenopathy or masses Respiratory: Breathing comfortably  Skin: No facial/neck lesions or rash noted.  Laryngoscopy  Date/Time: 10/08/2019 2:46 PM Performed by: Rozetta Nunnery, MD Authorized by: Rozetta Nunnery, MD   Consent:    Consent obtained:  Verbal   Consent given by:  Patient Procedure details:    Indications: hoarseness, dysphagia, or aspiration     Instrument: flexible fiberoptic laryngoscope  Scope location: right nare   Sinus:    Right nasopharynx: normal   Mouth:    Oropharynx: normal     Vallecula: normal     Base of tongue: normal     Epiglottis: normal   Throat:    True vocal cords: normal   Comments:     Patient with mild bowing or senile changes of the vocal cords but no specific vocal cord lesions noted.  Vocal cords had good mobility bilaterally.    Assessment: Hoarseness secondary to slight senile changes of the vocal cords along with history of reflux disease. This was probably exacerbated by endotracheal intubation from surgery for patient estimates 3 to 4 hours.  Plan: Recommended taking her AcipHex before dinner instead of first thing in the morning. Reassured her of no significant vocal cord damage and voice should gradually improve over the next month or 2. She will notify us if she has any persistent hoarseness beyond 2 months.  Radene Journey, MD

## 2020-01-19 DIAGNOSIS — M5116 Intervertebral disc disorders with radiculopathy, lumbar region: Secondary | ICD-10-CM | POA: Diagnosis not present

## 2020-01-21 DIAGNOSIS — M5116 Intervertebral disc disorders with radiculopathy, lumbar region: Secondary | ICD-10-CM | POA: Diagnosis not present

## 2020-02-03 DIAGNOSIS — M4316 Spondylolisthesis, lumbar region: Secondary | ICD-10-CM | POA: Diagnosis not present

## 2020-02-03 DIAGNOSIS — R030 Elevated blood-pressure reading, without diagnosis of hypertension: Secondary | ICD-10-CM | POA: Diagnosis not present

## 2020-02-03 DIAGNOSIS — M545 Low back pain, unspecified: Secondary | ICD-10-CM | POA: Diagnosis not present

## 2020-02-23 DIAGNOSIS — M5116 Intervertebral disc disorders with radiculopathy, lumbar region: Secondary | ICD-10-CM | POA: Diagnosis not present

## 2020-02-26 DIAGNOSIS — M5116 Intervertebral disc disorders with radiculopathy, lumbar region: Secondary | ICD-10-CM | POA: Diagnosis not present

## 2020-03-01 DIAGNOSIS — E7800 Pure hypercholesterolemia, unspecified: Secondary | ICD-10-CM | POA: Diagnosis not present

## 2020-03-01 DIAGNOSIS — K219 Gastro-esophageal reflux disease without esophagitis: Secondary | ICD-10-CM | POA: Diagnosis not present

## 2020-03-01 DIAGNOSIS — I10 Essential (primary) hypertension: Secondary | ICD-10-CM | POA: Diagnosis not present

## 2020-03-01 DIAGNOSIS — E039 Hypothyroidism, unspecified: Secondary | ICD-10-CM | POA: Diagnosis not present

## 2020-03-01 DIAGNOSIS — E559 Vitamin D deficiency, unspecified: Secondary | ICD-10-CM | POA: Diagnosis not present

## 2020-03-15 DIAGNOSIS — M4316 Spondylolisthesis, lumbar region: Secondary | ICD-10-CM | POA: Diagnosis not present

## 2020-03-15 DIAGNOSIS — Z981 Arthrodesis status: Secondary | ICD-10-CM | POA: Diagnosis not present

## 2020-05-10 DIAGNOSIS — Z961 Presence of intraocular lens: Secondary | ICD-10-CM | POA: Diagnosis not present

## 2020-05-16 ENCOUNTER — Other Ambulatory Visit: Payer: Self-pay | Admitting: Obstetrics and Gynecology

## 2020-05-16 DIAGNOSIS — E2839 Other primary ovarian failure: Secondary | ICD-10-CM

## 2020-06-09 ENCOUNTER — Other Ambulatory Visit: Payer: Self-pay | Admitting: Obstetrics and Gynecology

## 2020-06-09 DIAGNOSIS — Z1231 Encounter for screening mammogram for malignant neoplasm of breast: Secondary | ICD-10-CM

## 2020-06-28 DIAGNOSIS — L400 Psoriasis vulgaris: Secondary | ICD-10-CM | POA: Diagnosis not present

## 2020-07-29 DIAGNOSIS — M19071 Primary osteoarthritis, right ankle and foot: Secondary | ICD-10-CM | POA: Diagnosis not present

## 2020-07-29 DIAGNOSIS — M25571 Pain in right ankle and joints of right foot: Secondary | ICD-10-CM | POA: Diagnosis not present

## 2020-08-01 DIAGNOSIS — E559 Vitamin D deficiency, unspecified: Secondary | ICD-10-CM | POA: Diagnosis not present

## 2020-08-01 DIAGNOSIS — Z0000 Encounter for general adult medical examination without abnormal findings: Secondary | ICD-10-CM | POA: Diagnosis not present

## 2020-08-01 DIAGNOSIS — E7800 Pure hypercholesterolemia, unspecified: Secondary | ICD-10-CM | POA: Diagnosis not present

## 2020-08-01 DIAGNOSIS — K219 Gastro-esophageal reflux disease without esophagitis: Secondary | ICD-10-CM | POA: Diagnosis not present

## 2020-08-01 DIAGNOSIS — I10 Essential (primary) hypertension: Secondary | ICD-10-CM | POA: Diagnosis not present

## 2020-08-01 DIAGNOSIS — E039 Hypothyroidism, unspecified: Secondary | ICD-10-CM | POA: Diagnosis not present

## 2020-08-01 DIAGNOSIS — J309 Allergic rhinitis, unspecified: Secondary | ICD-10-CM | POA: Diagnosis not present

## 2020-08-23 DIAGNOSIS — D72829 Elevated white blood cell count, unspecified: Secondary | ICD-10-CM | POA: Diagnosis not present

## 2020-08-23 DIAGNOSIS — D473 Essential (hemorrhagic) thrombocythemia: Secondary | ICD-10-CM | POA: Diagnosis not present

## 2020-09-26 DIAGNOSIS — Z23 Encounter for immunization: Secondary | ICD-10-CM | POA: Diagnosis not present

## 2020-09-27 DIAGNOSIS — Z981 Arthrodesis status: Secondary | ICD-10-CM | POA: Diagnosis not present

## 2020-09-27 DIAGNOSIS — I10 Essential (primary) hypertension: Secondary | ICD-10-CM | POA: Diagnosis not present

## 2020-10-12 DIAGNOSIS — M25371 Other instability, right ankle: Secondary | ICD-10-CM | POA: Diagnosis not present

## 2020-10-12 DIAGNOSIS — M25871 Other specified joint disorders, right ankle and foot: Secondary | ICD-10-CM | POA: Diagnosis not present

## 2020-10-12 DIAGNOSIS — M19079 Primary osteoarthritis, unspecified ankle and foot: Secondary | ICD-10-CM | POA: Diagnosis not present

## 2020-10-12 DIAGNOSIS — M19071 Primary osteoarthritis, right ankle and foot: Secondary | ICD-10-CM | POA: Diagnosis not present

## 2020-10-12 DIAGNOSIS — M2141 Flat foot [pes planus] (acquired), right foot: Secondary | ICD-10-CM | POA: Diagnosis not present

## 2020-11-03 DIAGNOSIS — M19071 Primary osteoarthritis, right ankle and foot: Secondary | ICD-10-CM | POA: Diagnosis not present

## 2020-11-23 ENCOUNTER — Other Ambulatory Visit (HOSPITAL_COMMUNITY): Payer: Self-pay | Admitting: Orthopedic Surgery

## 2020-12-12 DIAGNOSIS — L400 Psoriasis vulgaris: Secondary | ICD-10-CM | POA: Diagnosis not present

## 2020-12-12 DIAGNOSIS — D1801 Hemangioma of skin and subcutaneous tissue: Secondary | ICD-10-CM | POA: Diagnosis not present

## 2020-12-12 DIAGNOSIS — L821 Other seborrheic keratosis: Secondary | ICD-10-CM | POA: Diagnosis not present

## 2020-12-12 DIAGNOSIS — D225 Melanocytic nevi of trunk: Secondary | ICD-10-CM | POA: Diagnosis not present

## 2020-12-15 DIAGNOSIS — M25571 Pain in right ankle and joints of right foot: Secondary | ICD-10-CM | POA: Diagnosis not present

## 2021-01-11 ENCOUNTER — Ambulatory Visit
Admission: RE | Admit: 2021-01-11 | Discharge: 2021-01-11 | Disposition: A | Discharge status: Home / Self Care | Payer: Medicare Other | Source: Ambulatory Visit | Attending: Obstetrics and Gynecology | Admitting: Obstetrics and Gynecology

## 2021-01-11 DIAGNOSIS — Z780 Asymptomatic menopausal state: Secondary | ICD-10-CM | POA: Diagnosis not present

## 2021-01-11 DIAGNOSIS — Z1231 Encounter for screening mammogram for malignant neoplasm of breast: Secondary | ICD-10-CM

## 2021-01-11 DIAGNOSIS — E2839 Other primary ovarian failure: Secondary | ICD-10-CM

## 2021-01-19 ENCOUNTER — Ambulatory Visit (HOSPITAL_BASED_OUTPATIENT_CLINIC_OR_DEPARTMENT_OTHER): Admit: 2021-01-19 | Payer: Medicare Other | Admitting: Orthopedic Surgery

## 2021-01-19 ENCOUNTER — Encounter (HOSPITAL_BASED_OUTPATIENT_CLINIC_OR_DEPARTMENT_OTHER): Payer: Self-pay

## 2021-01-19 SURGERY — ARTHROPLASTY, ANKLE, TOTAL
Anesthesia: General | Site: Ankle | Laterality: Right

## 2021-02-01 DIAGNOSIS — E7800 Pure hypercholesterolemia, unspecified: Secondary | ICD-10-CM | POA: Diagnosis not present

## 2021-02-01 DIAGNOSIS — E559 Vitamin D deficiency, unspecified: Secondary | ICD-10-CM | POA: Diagnosis not present

## 2021-02-01 DIAGNOSIS — G4700 Insomnia, unspecified: Secondary | ICD-10-CM | POA: Diagnosis not present

## 2021-02-01 DIAGNOSIS — E039 Hypothyroidism, unspecified: Secondary | ICD-10-CM | POA: Diagnosis not present

## 2021-02-01 DIAGNOSIS — I10 Essential (primary) hypertension: Secondary | ICD-10-CM | POA: Diagnosis not present

## 2021-02-01 DIAGNOSIS — J309 Allergic rhinitis, unspecified: Secondary | ICD-10-CM | POA: Diagnosis not present

## 2021-02-01 DIAGNOSIS — K219 Gastro-esophageal reflux disease without esophagitis: Secondary | ICD-10-CM | POA: Diagnosis not present

## 2021-02-15 ENCOUNTER — Other Ambulatory Visit (HOSPITAL_COMMUNITY): Payer: Self-pay | Admitting: Orthopedic Surgery

## 2021-03-30 DIAGNOSIS — H00015 Hordeolum externum left lower eyelid: Secondary | ICD-10-CM | POA: Diagnosis not present

## 2021-03-30 DIAGNOSIS — H01005 Unspecified blepharitis left lower eyelid: Secondary | ICD-10-CM | POA: Diagnosis not present

## 2021-03-30 DIAGNOSIS — H01002 Unspecified blepharitis right lower eyelid: Secondary | ICD-10-CM | POA: Diagnosis not present

## 2021-04-17 ENCOUNTER — Other Ambulatory Visit: Payer: Self-pay

## 2021-04-17 ENCOUNTER — Encounter (HOSPITAL_BASED_OUTPATIENT_CLINIC_OR_DEPARTMENT_OTHER): Payer: Self-pay | Admitting: Orthopedic Surgery

## 2021-04-25 ENCOUNTER — Encounter (HOSPITAL_BASED_OUTPATIENT_CLINIC_OR_DEPARTMENT_OTHER)
Admission: RE | Admit: 2021-04-25 | Discharge: 2021-04-25 | Disposition: A | Discharge status: Home / Self Care | Payer: Medicare Other | Source: Ambulatory Visit | Attending: Orthopedic Surgery | Admitting: Orthopedic Surgery

## 2021-04-25 DIAGNOSIS — I739 Peripheral vascular disease, unspecified: Secondary | ICD-10-CM | POA: Diagnosis not present

## 2021-04-25 DIAGNOSIS — M19071 Primary osteoarthritis, right ankle and foot: Secondary | ICD-10-CM | POA: Diagnosis not present

## 2021-04-25 DIAGNOSIS — K219 Gastro-esophageal reflux disease without esophagitis: Secondary | ICD-10-CM | POA: Diagnosis not present

## 2021-04-25 DIAGNOSIS — I10 Essential (primary) hypertension: Secondary | ICD-10-CM | POA: Diagnosis not present

## 2021-04-25 DIAGNOSIS — E039 Hypothyroidism, unspecified: Secondary | ICD-10-CM | POA: Diagnosis not present

## 2021-04-25 DIAGNOSIS — Z87891 Personal history of nicotine dependence: Secondary | ICD-10-CM | POA: Diagnosis not present

## 2021-04-25 DIAGNOSIS — Z79899 Other long term (current) drug therapy: Secondary | ICD-10-CM | POA: Diagnosis not present

## 2021-04-25 LAB — BASIC METABOLIC PANEL
Anion gap: 9 (ref 5–15)
BUN: 6 mg/dL — ABNORMAL LOW (ref 8–23)
CO2: 23 mmol/L (ref 22–32)
Calcium: 8.8 mg/dL — ABNORMAL LOW (ref 8.9–10.3)
Chloride: 100 mmol/L (ref 98–111)
Creatinine, Ser: 0.49 mg/dL (ref 0.44–1.00)
GFR, Estimated: >60 mL/min (ref >60)
Glucose, Bld: 101 mg/dL — ABNORMAL HIGH (ref 70–99)
Potassium: 4.3 mmol/L (ref 3.5–5.1)
Sodium: 132 mmol/L — ABNORMAL LOW (ref 135–145)

## 2021-04-25 LAB — SURGICAL PCR SCREEN
MRSA, PCR: NEGATIVE
Staphylococcus aureus: POSITIVE — AB

## 2021-04-25 NOTE — Progress Notes (Signed)

## 2021-04-26 NOTE — Progress Notes (Signed)
Monica Hinton at Dr. Nona Dell made aware that the pts PCR test resulted POSITIVE for STAPH and Negative for MRSA. ?

## 2021-04-27 ENCOUNTER — Ambulatory Visit (HOSPITAL_BASED_OUTPATIENT_CLINIC_OR_DEPARTMENT_OTHER)
Admission: RE | Admit: 2021-04-27 | Discharge: 2021-04-27 | Disposition: A | Discharge status: Home / Self Care | Payer: Medicare Other | Source: Ambulatory Visit | Attending: Orthopedic Surgery | Admitting: Orthopedic Surgery

## 2021-04-27 ENCOUNTER — Ambulatory Visit (HOSPITAL_COMMUNITY): Payer: Medicare Other

## 2021-04-27 ENCOUNTER — Encounter (HOSPITAL_BASED_OUTPATIENT_CLINIC_OR_DEPARTMENT_OTHER)
Admission: RE | Disposition: A | Discharge status: Home / Self Care | Payer: Self-pay | Source: Ambulatory Visit | Attending: Orthopedic Surgery

## 2021-04-27 ENCOUNTER — Other Ambulatory Visit: Payer: Self-pay

## 2021-04-27 ENCOUNTER — Encounter (HOSPITAL_BASED_OUTPATIENT_CLINIC_OR_DEPARTMENT_OTHER): Payer: Self-pay | Admitting: Orthopedic Surgery

## 2021-04-27 ENCOUNTER — Ambulatory Visit (HOSPITAL_BASED_OUTPATIENT_CLINIC_OR_DEPARTMENT_OTHER): Payer: Medicare Other | Admitting: Certified Registered"

## 2021-04-27 DIAGNOSIS — G8918 Other acute postprocedural pain: Secondary | ICD-10-CM | POA: Diagnosis not present

## 2021-04-27 DIAGNOSIS — E039 Hypothyroidism, unspecified: Secondary | ICD-10-CM | POA: Insufficient documentation

## 2021-04-27 DIAGNOSIS — M19072 Primary osteoarthritis, left ankle and foot: Secondary | ICD-10-CM

## 2021-04-27 DIAGNOSIS — I10 Essential (primary) hypertension: Secondary | ICD-10-CM

## 2021-04-27 DIAGNOSIS — Z01818 Encounter for other preprocedural examination: Secondary | ICD-10-CM

## 2021-04-27 DIAGNOSIS — M19071 Primary osteoarthritis, right ankle and foot: Secondary | ICD-10-CM | POA: Diagnosis not present

## 2021-04-27 DIAGNOSIS — Z87891 Personal history of nicotine dependence: Secondary | ICD-10-CM | POA: Diagnosis not present

## 2021-04-27 DIAGNOSIS — K219 Gastro-esophageal reflux disease without esophagitis: Secondary | ICD-10-CM | POA: Insufficient documentation

## 2021-04-27 DIAGNOSIS — Z79899 Other long term (current) drug therapy: Secondary | ICD-10-CM | POA: Insufficient documentation

## 2021-04-27 DIAGNOSIS — I739 Peripheral vascular disease, unspecified: Secondary | ICD-10-CM | POA: Insufficient documentation

## 2021-04-27 DIAGNOSIS — Z9889 Other specified postprocedural states: Secondary | ICD-10-CM | POA: Diagnosis not present

## 2021-04-27 HISTORY — DX: Nausea with vomiting, unspecified: R11.2

## 2021-04-27 HISTORY — PX: TOTAL ANKLE ARTHROPLASTY: SHX811

## 2021-04-27 HISTORY — DX: Other specified postprocedural states: Z98.890

## 2021-04-27 SURGERY — ARTHROPLASTY, ANKLE, TOTAL
Anesthesia: Regional | Site: Ankle | Laterality: Right

## 2021-04-27 MED ORDER — LACTATED RINGERS IV SOLN: INTRAVENOUS | Status: DC

## 2021-04-27 MED ORDER — LIDOCAINE 2% (20 MG/ML) 5 ML SYRINGE
INTRAMUSCULAR | Status: AC
  Filled 2021-04-27: qty 5

## 2021-04-27 MED ORDER — DIPHENHYDRAMINE HCL 50 MG/ML IJ SOLN
INTRAMUSCULAR | Status: DC | PRN
  Administered 2021-04-27: 12.5 mg via INTRAVENOUS

## 2021-04-27 MED ORDER — DEXAMETHASONE SODIUM PHOSPHATE 10 MG/ML IJ SOLN
INTRAMUSCULAR | Status: AC
  Filled 2021-04-27: qty 1

## 2021-04-27 MED ORDER — SENNA 8.6 MG PO TABS: 2.0000 | ORAL_TABLET | Freq: Two times a day (BID) | ORAL | 0 refills | Status: DC

## 2021-04-27 MED ORDER — ASPIRIN EC 81 MG PO TBEC
81.0000 mg | DELAYED_RELEASE_TABLET | Freq: Two times a day (BID) | ORAL | 0 refills | Status: DC
Start: 2021-04-27 — End: 2021-07-24

## 2021-04-27 MED ORDER — CEFAZOLIN SODIUM-DEXTROSE 2-4 GM/100ML-% IV SOLN: 2.0000 g | INTRAVENOUS | Status: DC

## 2021-04-27 MED ORDER — PROPOFOL 10 MG/ML IV BOLUS
INTRAVENOUS | Status: AC
  Filled 2021-04-27: qty 20

## 2021-04-27 MED ORDER — FENTANYL CITRATE (PF) 100 MCG/2ML IJ SOLN
INTRAMUSCULAR | Status: AC
  Filled 2021-04-27: qty 2

## 2021-04-27 MED ORDER — CEFAZOLIN SODIUM-DEXTROSE 2-4 GM/100ML-% IV SOLN
INTRAVENOUS | Status: AC
Start: 2021-04-27 — End: ?
  Filled 2021-04-27: qty 100

## 2021-04-27 MED ORDER — ACETAMINOPHEN 500 MG PO TABS: 1000.0000 mg | ORAL_TABLET | Freq: Once | ORAL | Status: DC

## 2021-04-27 MED ORDER — LACTATED RINGERS IV SOLN: INTRAVENOUS | Status: DC | PRN

## 2021-04-27 MED ORDER — FENTANYL CITRATE (PF) 100 MCG/2ML IJ SOLN
50.0000 ug | Freq: Once | INTRAMUSCULAR | Status: AC
  Administered 2021-04-27: 50 ug via INTRAVENOUS

## 2021-04-27 MED ORDER — CEFAZOLIN SODIUM-DEXTROSE 2-3 GM-%(50ML) IV SOLR
INTRAVENOUS | Status: DC | PRN
  Administered 2021-04-27: 2 g via INTRAVENOUS

## 2021-04-27 MED ORDER — ROPIVACAINE HCL 5 MG/ML IJ SOLN
INTRAMUSCULAR | Status: DC | PRN
  Administered 2021-04-27: 30 mL via PERINEURAL
  Administered 2021-04-27: 15 mL via PERINEURAL

## 2021-04-27 MED ORDER — LIDOCAINE HCL (CARDIAC) PF 100 MG/5ML IV SOSY
PREFILLED_SYRINGE | INTRAVENOUS | Status: DC | PRN
  Administered 2021-04-27: 20 mg via INTRAVENOUS

## 2021-04-27 MED ORDER — ONDANSETRON HCL 4 MG/2ML IJ SOLN
INTRAMUSCULAR | Status: DC | PRN
Start: 2021-04-27 — End: 2021-04-27
  Administered 2021-04-27: 4 mg via INTRAVENOUS

## 2021-04-27 MED ORDER — ONDANSETRON HCL 4 MG/2ML IJ SOLN
INTRAMUSCULAR | Status: AC
  Filled 2021-04-27: qty 2

## 2021-04-27 MED ORDER — MIDAZOLAM HCL 2 MG/2ML IJ SOLN
2.0000 mg | Freq: Once | INTRAMUSCULAR | Status: AC
  Administered 2021-04-27: 2 mg via INTRAVENOUS

## 2021-04-27 MED ORDER — SODIUM CHLORIDE 0.9 % IV SOLN: INTRAVENOUS | Status: DC

## 2021-04-27 MED ORDER — DEXAMETHASONE SODIUM PHOSPHATE 10 MG/ML IJ SOLN
INTRAMUSCULAR | Status: DC | PRN
  Administered 2021-04-27: 5 mg via INTRAVENOUS

## 2021-04-27 MED ORDER — FENTANYL CITRATE (PF) 100 MCG/2ML IJ SOLN
25.0000 ug | INTRAMUSCULAR | Status: DC | PRN
  Administered 2021-04-27: 50 ug via INTRAVENOUS

## 2021-04-27 MED ORDER — DEXAMETHASONE SODIUM PHOSPHATE 10 MG/ML IJ SOLN
INTRAMUSCULAR | Status: DC | PRN
  Administered 2021-04-27 (×2): 5 mg

## 2021-04-27 MED ORDER — 0.9 % SODIUM CHLORIDE (POUR BTL) OPTIME
TOPICAL | Status: DC | PRN
  Administered 2021-04-27: 400 mL

## 2021-04-27 MED ORDER — ACETAMINOPHEN 500 MG PO TABS
ORAL_TABLET | ORAL | Status: AC
  Filled 2021-04-27: qty 2

## 2021-04-27 MED ORDER — DIPHENHYDRAMINE HCL 50 MG/ML IJ SOLN
INTRAMUSCULAR | Status: AC
  Filled 2021-04-27: qty 1

## 2021-04-27 MED ORDER — VANCOMYCIN HCL 500 MG IV SOLR
INTRAVENOUS | Status: DC | PRN
  Administered 2021-04-27: 500 mg via TOPICAL

## 2021-04-27 MED ORDER — PROPOFOL 10 MG/ML IV BOLUS
INTRAVENOUS | Status: DC | PRN
  Administered 2021-04-27: 160 mg via INTRAVENOUS

## 2021-04-27 MED ORDER — MIDAZOLAM HCL 2 MG/2ML IJ SOLN
INTRAMUSCULAR | Status: AC
  Filled 2021-04-27: qty 2

## 2021-04-27 MED ORDER — OXYCODONE HCL 5 MG PO TABS: 5.0000 mg | ORAL_TABLET | ORAL | 0 refills | Status: AC | PRN

## 2021-04-27 MED ORDER — VANCOMYCIN HCL 500 MG IV SOLR
INTRAVENOUS | Status: AC
  Filled 2021-04-27: qty 10

## 2021-04-27 SURGICAL SUPPLY — 87 items
APL PRP STRL LF DISP 70% ISPRP (MISCELLANEOUS) ×1
BANDAGE ESMARK 6X9 LF (GAUZE/BANDAGES/DRESSINGS) IMPLANT
BIT DRILL 2.9 CANN QC NONSTRL (BIT) ×1 IMPLANT
BLADE KM SAW (BLADE) ×1 IMPLANT
BLADE RECIPRO TAPERED (BLADE) ×2 IMPLANT
BLADE SAW OSC ANKLE 8X63X1.19 (BLADE) IMPLANT
BLADE SAW RECIP ANKLE 8X50X1 (PIN) IMPLANT
BLADE SURG 15 STRL LF DISP TIS (BLADE) ×3 IMPLANT
BLADE SURG 15 STRL SS (BLADE) ×6
BNDG CMPR 9X6 STRL LF SNTH (GAUZE/BANDAGES/DRESSINGS)
BNDG COHESIVE 4X5 TAN ST LF (GAUZE/BANDAGES/DRESSINGS) ×2 IMPLANT
BNDG ELASTIC 4X5.8 VLCR STR LF (GAUZE/BANDAGES/DRESSINGS) ×2 IMPLANT
BNDG ELASTIC 6X5.8 VLCR STR LF (GAUZE/BANDAGES/DRESSINGS) IMPLANT
BNDG ESMARK 6X9 LF (GAUZE/BANDAGES/DRESSINGS)
CHLORAPREP W/TINT 26 (MISCELLANEOUS) ×2 IMPLANT
CLIP LOCKING VANTAGE SZ 2 (Clip) ×1 IMPLANT
COVER BACK TABLE 60X90IN (DRAPES) ×4 IMPLANT
COVER MAYO STAND STRL (DRAPES) ×1 IMPLANT
DRAPE C-ARM 42X72 X-RAY (DRAPES) ×2 IMPLANT
DRAPE C-ARMOR (DRAPES) ×2 IMPLANT
DRAPE EXTREMITY T 121X128X90 (DISPOSABLE) ×2 IMPLANT
DRAPE U-SHAPE 47X51 STRL (DRAPES) ×2 IMPLANT
DRESSING MEPILEX FLEX 4X4 (GAUZE/BANDAGES/DRESSINGS) ×1 IMPLANT
DRSG MEPILEX BORDER 4X8 (GAUZE/BANDAGES/DRESSINGS) IMPLANT
DRSG MEPILEX FLEX 4X4 (GAUZE/BANDAGES/DRESSINGS) ×2
DRSG MEPITEL 4X7.2 (GAUZE/BANDAGES/DRESSINGS) ×2 IMPLANT
DRSG PAD ABDOMINAL 8X10 ST (GAUZE/BANDAGES/DRESSINGS) ×4 IMPLANT
ELECT REM PT RETURN 9FT ADLT (ELECTROSURGICAL) ×2
ELECTRODE REM PT RTRN 9FT ADLT (ELECTROSURGICAL) ×1 IMPLANT
GAUZE SPONGE 4X4 12PLY STRL (GAUZE/BANDAGES/DRESSINGS) ×2 IMPLANT
GLOVE BIOGEL PI IND STRL 7.0 (GLOVE) IMPLANT
GLOVE BIOGEL PI IND STRL 8 (GLOVE) ×2 IMPLANT
GLOVE BIOGEL PI INDICATOR 7.0 (GLOVE) ×1
GLOVE BIOGEL PI INDICATOR 8 (GLOVE) ×2
GLOVE ECLIPSE 8.0 STRL XLNG CF (GLOVE) ×2 IMPLANT
GLOVE INDICATOR 7.0 STRL GRN (GLOVE) ×2 IMPLANT
GLOVE SURG ENC MOIS LTX SZ8 (GLOVE) ×2 IMPLANT
GLOVE SURG SS PI 7.0 STRL IVOR (GLOVE) ×1 IMPLANT
GOWN STRL REUS W/ TWL LRG LVL3 (GOWN DISPOSABLE) ×1 IMPLANT
GOWN STRL REUS W/ TWL XL LVL3 (GOWN DISPOSABLE) ×1 IMPLANT
GOWN STRL REUS W/TWL LRG LVL3 (GOWN DISPOSABLE) ×4
GOWN STRL REUS W/TWL XL LVL3 (GOWN DISPOSABLE) ×4 IMPLANT
IMPL TALAR SZ1 RT (Ankle) IMPLANT
IMPLANT TALAR SZ1 RT (Ankle) ×2 IMPLANT
INSERT TIB FB ANKLE SZ 1X8 RT (Ankle) ×1 IMPLANT
K-WIRE ACE 1.6X6 (WIRE) ×2
KWIRE ACE 1.6X6 (WIRE) IMPLANT
NEEDLE HYPO 22GX1.5 SAFETY (NEEDLE) IMPLANT
NS IRRIG 1000ML POUR BTL (IV SOLUTION) ×2 IMPLANT
PACK BASIN DAY SURGERY FS (CUSTOM PROCEDURE TRAY) ×2 IMPLANT
PAD CAST 4YDX4 CTTN HI CHSV (CAST SUPPLIES) ×2 IMPLANT
PADDING CAST ABS 4INX4YD NS (CAST SUPPLIES)
PADDING CAST ABS COTTON 4X4 ST (CAST SUPPLIES) IMPLANT
PADDING CAST COTTON 4X4 STRL (CAST SUPPLIES) ×4
PADDING CAST COTTON 6X4 STRL (CAST SUPPLIES) ×2 IMPLANT
PENCIL SMOKE EVACUATOR (MISCELLANEOUS) ×2 IMPLANT
PIN POUCH TALAR VANTAGE 2.0 (PIN) ×1 IMPLANT
PIN POUCH TALAR VANTAGE 2.5 (PIN) ×1 IMPLANT
PIN POUCH TALAR VANTAGE 3.5 (PIN) ×2 IMPLANT
PLATE TIB BEARING VT SZ 2 RT (Plate) ×1 IMPLANT
POUCH SCREW TALAR ANKLE (ORTHOPEDIC DISPOSABLE SUPPLIES) ×1 IMPLANT
SANITIZER HAND PURELL 535ML FO (MISCELLANEOUS) ×2 IMPLANT
SAW STRYKER ANKLE 10X75X1.19 (BLADE) ×1 IMPLANT
SCOTCHCAST PLUS 3X4 WHITE (CAST SUPPLIES) IMPLANT
SCOTCHCAST PLUS 4X4 WHITE (CAST SUPPLIES) ×3 IMPLANT
SCREW CORTICAL 3.5MM 50MM (Screw) ×1 IMPLANT
SHEET MEDIUM DRAPE 40X70 STRL (DRAPES) ×2 IMPLANT
SLEEVE SCD COMPRESS KNEE MED (STOCKING) ×2 IMPLANT
SPIKE FLUID TRANSFER (MISCELLANEOUS) IMPLANT
SPONGE T-LAP 18X18 ~~LOC~~+RFID (SPONGE) ×2 IMPLANT
STOCKINETTE 6  STRL (DRAPES) ×2
STOCKINETTE 6 STRL (DRAPES) ×1 IMPLANT
SUCTION FRAZIER HANDLE 10FR (MISCELLANEOUS) ×2
SUCTION TUBE FRAZIER 10FR DISP (MISCELLANEOUS) IMPLANT
SURGILUBE 2OZ TUBE FLIPTOP (MISCELLANEOUS) ×2 IMPLANT
SUT ETHILON 3 0 PS 1 (SUTURE) ×4 IMPLANT
SUT MNCRL AB 3-0 PS2 18 (SUTURE) ×4 IMPLANT
SUT VIC AB 0 CT1 27 (SUTURE) ×2
SUT VIC AB 0 CT1 27XBRD ANBCTR (SUTURE) ×1 IMPLANT
SUT VIC AB 2-0 SH 27 (SUTURE)
SUT VIC AB 2-0 SH 27XBRD (SUTURE) IMPLANT
SYR 20ML LL LF (SYRINGE) IMPLANT
SYR BULB IRRIG 60ML STRL (SYRINGE) ×2 IMPLANT
TOWEL GREEN STERILE FF (TOWEL DISPOSABLE) ×4 IMPLANT
TUBE CONNECTING 20X1/4 (TUBING) ×2 IMPLANT
UNDERPAD 30X36 HEAVY ABSORB (UNDERPADS AND DIAPERS) ×2 IMPLANT
YANKAUER SUCT BULB TIP NO VENT (SUCTIONS) ×2 IMPLANT

## 2021-04-27 NOTE — Op Note (Signed)
04/27/2021 ? ?9:53 AM ? ?PATIENT:  Monica Hinton  76 y.o. female ? ?PRE-OPERATIVE DIAGNOSIS:  Right ankle osteoarthritis ? ?POST-OPERATIVE DIAGNOSIS:  Right ankle osteoarthritis ? ?Procedure(s):  Right TOTAL ANKLE ARTHROPLASTY  (exactech vantage - size 1 talus, 2 tibia, 8 mm poly spacer ? ?SURGEON:  Wylene Simmer, MD ? ?ASSISTANT: Mechele Claude, PA-C ? ?ANESTHESIA:   General, regional ? ?EBL:  minimal  ? ?TOURNIQUET:   ?Total Tourniquet Time Documented: ?Thigh (Right) - 101 minutes ?Total: Thigh (Right) - 101 minutes ? ?COMPLICATIONS:  None apparent ? ?DISPOSITION:  Extubated, awake and stable to recovery. ? ?INDICATION FOR PROCEDURE: The patient is a 76 year old female with a long history of right ankle pain due to valgus ankle arthritis.  She has failed nonoperative treatment to date and presents for right total ankle replacement.  The risks and benefits of the alternative treatment options have been discussed in detail.  The patient wishes to proceed with surgery and specifically understands risks of bleeding, infection, nerve damage, blood clots, need for additional surgery, amputation and death.  ? ?PROCEDURE IN DETAIL:  After pre operative consent was obtained, and the correct operative site was identified, the patient was brought to the operating room and placed supine on the OR table.  Anesthesia was administered.  Pre-operative antibiotics were administered.  A surgical timeout was taken.  The right lower extremity was prepped and draped in standard sterile fashion with a tourniquet around the thigh.  The extremity was elevated and the tourniquet was inflated to 250 mmHg.  A longitudinal incision was made over the anterior ankle.  Dissection was carried sharply down through the subcutaneous tissue taking care to protect branches of the superficial peroneal nerve.  The extensor retinaculum was incised and released proximally and distally over the EHL.  The interval between the tibialis anterior and EHL was  developed.  The neurovascular bundle was identified.  It was protected throughout the case.  The anterior joint capsule was incised and elevated medially and laterally.  Osteophytes and loose bodies were removed from the anterior joint line.  The guidepin was then inserted in the tibial tubercle through a small incision in line with the medial gutter.  The external alignment guide was applied.  It was aligned in the AP plane and then rotation was set in line with the medial gutter.  The guide was then the aligned in the lateral plane after confirming appropriate varus valgus alignment.  The block was pinned appropriately.  Slope was confirmed along with resection height.  The block was translated slightly laterally after confirming a size 2.  The distal tibial cut was made with a reciprocating oscillating saws.  Bone was removed.  The talar cutting guide was then applied after removing the remaining articular cartilage from the medial talus.  The talus cut was made and waste bone removed.  The talus was sized as a #1 after a size 2 resection block was tested.  The size 1 lollipop was inserted and positioned appropriately.  It was pinned in place and then removed.  The size 1 cutting block was inserted and pinned after confirming appropriate position with a lateral x-ray.  The 2 anterior middle slots were filled appropriately.  The 2 posterior cuts were then removed and the block was removed.  Always bone was removed.  A rasp was used to smooth the cuts appropriately.  The wound was irrigated to remove all bone debris.  A size 1 talar trial was inserted and was noted  to fit appropriately.  A size 2 talar trial was inserted followed by a centimeter poly spacer.  AP and lateral radiographs confirmed appropriate position of all hardware and appropriate alignment of the cuts.  The medial and lateral ligaments were somewhat unbalanced.  The deep and superficial deltoid ligament medially was released.  Ligament balance was  appropriate at this point.  After range of motion was tested the 2 lug holes were drilled in the talar implant.  The talar implant was removed.  The peripheral lug holes were punched through the tibial trial followed by the central lug hole.  The tibial trial was removed.  The wound was irrigated copiously.  The tibial implant was selected and coated in vancomycin powder.  It was impacted into position.  Radiographs confirmed appropriate position of the implant.  The tibial implant was then inserted and impacted into position.  Trial poly was inserted again.  The ligament balance was again noted to be appropriate.  The trial poly was removed and replaced with an 8 mm polyethylene spacer.  The locking clip was inserted and was noted to seat appropriately.  Final AP, mortise and lateral radiographs confirmed appropriate position of the hardware.  The medial malleolus appeared slightly thin.  A guidepin for 4 mm cannulated screw was inserted.  AP and lateral radiographs confirmed appropriate position of the guidepin.  The guidepin was overdrilled and removed.  A 3.5 mm fully threaded cortical screw 50 mm in length from the [employer] small frag set was inserted and seated appropriately at the tip of the medial malleolus.  The wound was then irrigated copiously and sprinkled with vancomycin powder.  The anterior joint capsule was repaired with 0 Vicryl.  The extensor retinaculum was repaired with running and figure-of-eight sutures of 0 Vicryl.  Subcutaneous tissues were approximated with Monocryl.  The skin incision was closed with running 3-0 nylon.  The remaining incisions were closed with horizontal mattress sutures of 3-0 nylon.  Sterile dressings were applied followed by a well-padded short leg cast.  The tourniquet was released after application of the dressings.  The patient was awakened from anesthesia and transported to the recovery room in stable condition. ? ? ?FOLLOW UP PLAN: Nonweightbearing on the  right lower extremity.  Follow-up in the office in 3 weeks for suture removal and conversion to a cam boot if the incision looks good.  Aspirin for DVT prophylaxis. ? ? ? Mechele Claude PA-C was present and scrubbed for the duration of the operative case. His assistance was essential in positioning the patient, prepping and draping, gaining and maintaining exposure, performing the operation, closing and dressing the wounds and applying the splint. ? ?  ?

## 2021-04-27 NOTE — Anesthesia Postprocedure Evaluation (Signed)
Anesthesia Post Note ? ?Patient: MALAJAH OCEGUERA ? ?Procedure(s) Performed: TOTAL ANKLE ARTHROPLASTY (Right: Ankle) ? ?  ? ?Patient location during evaluation: PACU ?Anesthesia Type: Regional and General ?Level of consciousness: awake and alert ?Pain management: pain level controlled ?Vital Signs Assessment: post-procedure vital signs reviewed and stable ?Respiratory status: spontaneous breathing, nonlabored ventilation, respiratory function stable and patient connected to nasal cannula oxygen ?Cardiovascular status: blood pressure returned to baseline and stable ?Postop Assessment: no apparent nausea or vomiting ?Anesthetic complications: no ? ? ?No notable events documented. ? ?Last Vitals:  ?Vitals:  ? 04/27/21 1000 04/27/21 1124  ?BP: (!) 126/58 139/81  ?Pulse: 75 79  ?Resp: 13 16  ?Temp:  36.6 ?C  ?SpO2: 95% 97%  ?  ?Last Pain:  ?Vitals:  ? 04/27/21 1051  ?TempSrc:   ?PainSc: 0-No pain  ? ? ?  ?  ?  ?  ?  ?  ? ?Vihana Kydd L Shemuel Harkleroad ? ? ? ? ?

## 2021-04-27 NOTE — Anesthesia Procedure Notes (Signed)
Procedure Name: LMA Insertion ?Date/Time: 04/27/2021 7:53 AM ?Performed by: Verita Lamb, CRNA ?Pre-anesthesia Checklist: Patient identified, Emergency Drugs available, Suction available and Patient being monitored ?Patient Re-evaluated:Patient Re-evaluated prior to induction ?Oxygen Delivery Method: Circle system utilized ?Preoxygenation: Pre-oxygenation with 100% oxygen ?Induction Type: IV induction ?Ventilation: Mask ventilation without difficulty ?LMA: LMA inserted ?LMA Size: 4.0 ?Number of attempts: 1 ?Airway Equipment and Method: Bite block ?Placement Confirmation: positive ETCO2, breath sounds checked- equal and bilateral and CO2 detector ?Tube secured with: Tape ?Dental Injury: Teeth and Oropharynx as per pre-operative assessment  ? ? ? ? ?

## 2021-04-27 NOTE — Discharge Instructions (Addendum)
Monica Simmer, MD ?[facility] ? ?Please read the following information regarding your care after surgery. ? ?Medications  ?You only need a prescription for the narcotic pain medicine (ex. oxycodone, Percocet, Norco).  All of the other medicines listed below are available over the counter. ?? Aleve 2 pills twice a day for the first 3 days after surgery. ?? acetominophen (Tylenol) 650 mg every 4-6 hours as you need for minor to moderate pain ?? oxycodone as prescribed for severe pain ? ?Narcotic pain medicine (ex. oxycodone, Percocet, Vicodin) will cause constipation.  To prevent this problem, take the following medicines while you are taking any pain medicine. ?? docusate sodium (Colace) 100 mg twice a day ? senna (Senokot) 2 tablets twice a day ? ?? To help prevent blood clots, take a baby aspirin (81 mg) twice a day for tthree weeks after surgery.  You should also get up every hour while you are awake to move around.   ? ?Weight Bearing ?? Do not bear any weight on the operated leg or foot. ? ?Cast / Splint / Dressing ?? Keep your splint, cast or dressing clean and dry.  Don?t put anything (coat hanger, pencil, etc) down inside of it.  If it gets damp, use a hair dryer on the cool setting to dry it.  If it gets soaked, call the office to schedule an appointment for a cast change. ?  ? ?After your dressing, cast or splint is removed; you may shower, but do not soak or scrub the wound.  Allow the water to run over it, and then gently pat it dry. ? ?Swelling ?It is normal for you to have swelling where you had surgery.  To reduce swelling and pain, keep your toes above your nose for at least 3 days after surgery.  It may be necessary to keep your foot or leg elevated for several weeks.  If it hurts, it should be elevated. ? ?Follow Up ?Call my office at [phone] when you are discharged from the hospital or surgery center to schedule an appointment to be seen 3 weeks after surgery. ? ?Call my office at [phone]  if you develop a fever >101.5? F, nausea, vomiting, bleeding from the surgical site or severe pain.   ?  ? ? ?Post Anesthesia Home Care Instructions ? ?Activity: ?Get plenty of rest for the remainder of the day. A responsible individual must stay with you for 24 hours following the procedure.  ?For the next 24 hours, DO NOT: ?-Drive a car ?-Paediatric nurse ?-Drink alcoholic beverages ?-Take any medication unless instructed by your physician ?-Make any legal decisions or sign important papers. ? ?Meals: ?Start with liquid foods such as gelatin or soup. Progress to regular foods as tolerated. Avoid greasy, spicy, heavy foods. If nausea and/or vomiting occur, drink only clear liquids until the nausea and/or vomiting subsides. Call your physician if vomiting continues. ? ?Special Instructions/Symptoms: ?Your throat may feel dry or sore from the anesthesia or the breathing tube placed in your throat during surgery. If this causes discomfort, gargle with warm salt water. The discomfort should disappear within 24 hours. ? ?If you had a scopolamine patch placed behind your ear for the management of post- operative nausea and/or vomiting: ? ?1. The medication in the patch is effective for 72 hours, after which it should be removed.  Wrap patch in a tissue and discard in the trash. Wash hands thoroughly with soap and water. ?2. You may remove the patch earlier than 72 hours  if you experience unpleasant side effects which may include dry mouth, dizziness or visual disturbances. ?3. Avoid touching the patch. Wash your hands with soap and water after contact with the patch. ?   ? ?

## 2021-04-27 NOTE — Transfer of Care (Signed)
Immediate Anesthesia Transfer of Care Note ? ?Patient: Monica Hinton ? ?Procedure(s) Performed: TOTAL ANKLE ARTHROPLASTY (Right: Ankle) ? ?Patient Location: PACU ? ?Anesthesia Type:General and Regional ? ?Level of Consciousness: awake, alert  and oriented ? ?Airway & Oxygen Therapy: Patient Spontanous Breathing and Patient connected to face mask oxygen ? ?Post-op Assessment: Report given to RN and Post -op Vital signs reviewed and stable ? ?Post vital signs: Reviewed and stable ? ?Last Vitals:  ?Vitals Value Taken Time  ?BP    ?Temp    ?Pulse 82 04/27/21 0939  ?Resp 25 04/27/21 0939  ?SpO2 98 % 04/27/21 0939  ?Vitals shown include unvalidated device data. ? ?Last Pain:  ?Vitals:  ? 04/27/21 0706  ?TempSrc:   ?PainSc: 0-No pain  ?   ? ?  ? ?Complications: No notable events documented. ?

## 2021-04-27 NOTE — H&P (Signed)
Monica Hinton is an 76 y.o. female.   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  She has failed non op treatment to date including activity modification, oral anti inflammatories, therapy and shoewear modification.  She presents now for right total ankle replacement. ? ?Past Medical History:  ?Diagnosis Date  ? Asthma   ? seasonal   ? Bursitis   ? r hip   ? GERD (gastroesophageal reflux disease)   ? High cholesterol   ? Hypertension   ? Hypothyroidism   ? Peripheral vascular disease ([place])   ? bilateral LE edema ; edma in hands as well   ? PONV (postoperative nausea and vomiting)   ? ? ?Past Surgical History:  ?Procedure Laterality Date  ? bladder     ? bladder tack  20 years ago   ? COLONOSCOPY    ? EYE SURGERY Bilateral   ? catracts  ? hysterectomy     ? 20 years   ? OPEN SURGICAL REPAIR OF GLUTEAL TENDON Right 09/19/2016  ? Procedure: Right hip bursectomy and gluteal tendon repair;  Surgeon: Gaynelle Arabian, MD;  Location: [facility];  Service: Orthopedics;  Laterality: Right;  ? TONSILLECTOMY    ? age 52   ? ? ?Family History  ?Problem Relation Age of Onset  ? Colon cancer Mother   ?     69s  ? ?Social History:  reports that she has quit smoking. Her smoking use included cigarettes. She has a 3.00 pack-year smoking history. She has never used smokeless tobacco. She reports current alcohol use. She reports that she does not use drugs. ? ?Allergies:  ?Allergies  ?Allergen Reactions  ? Yellow Jacket Venom [Bee Venom] Anaphylaxis  ?  Pt has EPI PEN  ? Codeine Nausea And Vomiting  ? Other   ?  Lactose Intolerant  ?  ? Penicillins Hives  ?  Many years - dr does not put pt on this  ? Chlorhexidine Rash  ?  With preop washes  ? ? ?Medications Prior to Admission  ?Medication Sig Dispense Refill  ? acetaminophen (TYLENOL) 500 MG tablet Take 1,000 mg by mouth in the morning, at noon, in the evening, and at bedtime.    ? estradiol  (ESTRACE) 1 MG tablet Take 1 mg by mouth daily.    ? gabapentin (NEURONTIN) 300 MG capsule Take 600 mg by mouth in the morning, at noon, in the evening, and at bedtime.    ? hydrochlorothiazide (HYDRODIURIL) 12.5 MG tablet Take 12.5 mg by mouth daily.    ? levothyroxine (SYNTHROID) 112 MCG tablet Take 112 mcg by mouth daily before breakfast.    ? montelukast (SINGULAIR) 10 MG tablet Take 10 mg by mouth at bedtime.    ? RABEprazole (ACIPHEX) 20 MG tablet Take 20 mg by mouth daily before breakfast.     ? rosuvastatin (CRESTOR) 10 MG tablet Take 10 mg by mouth daily.     ? Vitamin D, Ergocalciferol, (DRISDOL) 1.25 MG (50000 UNIT) CAPS capsule Take 50,000 Units by mouth every Monday.    ? cyclobenzaprine (FLEXERIL) 10 MG tablet Take 1 tablet (10 mg total) by mouth 3 (three) times daily as needed for muscle spasms. 30 tablet 0  ? docusate sodium (COLACE) 100 MG capsule Take 1 capsule (100 mg total) by mouth 2 (two) times daily. 10 capsule 0  ? HYDROmorphone (DILAUDID) 2 MG tablet Take 1-2  tablets (2-4 mg total) by mouth every 6 (six) hours as needed for moderate pain. 30 tablet 0  ? ? ?Results for orders placed or performed during the hospital encounter of 2021/05/21 (from the past 48 hour(s))  ?Basic metabolic panel per protocol     Status: Abnormal  ? Collection Time: 04/25/21 10:36 AM  ?Result Value Ref Range  ? Sodium 132 (L) 135 - 145 mmol/L  ? Potassium 4.3 3.5 - 5.1 mmol/L  ? Chloride 100 98 - 111 mmol/L  ? CO2 23 22 - 32 mmol/L  ? Glucose, Bld 101 (H) 70 - 99 mg/dL  ?  Comment: Glucose reference range applies only to samples taken after fasting for at least 8 hours.  ? BUN 6 (L) 8 - 23 mg/dL  ? Creatinine, Ser 0.49 0.44 - 1.00 mg/dL  ? Calcium 8.8 (L) 8.9 - 10.3 mg/dL  ? GFR, Estimated >60 >60 mL/min  ?  Comment: (NOTE) ?Calculated using the CKD-EPI Creatinine Equation (2021) ?  ? Anion gap 9 5 - 15  ?  Comment: Performed at [facility], [place] [address] [place], [place] [address]  ?Surgical PCR Screen      Status: Abnormal  ? Collection Time: 04/25/21 10:38 AM  ? Specimen: Nasal Mucosa; Nasal Swab  ?Result Value Ref Range  ? MRSA, PCR NEGATIVE NEGATIVE  ? Staphylococcus aureus POSITIVE (A) NEGATIVE  ?  Comment: (NOTE) ?The Xpert SA Assay (FDA approved for NASAL specimens in patients 57 ?years of age and older), is one component of a comprehensive ?surveillance program. It is not intended to diagnose infection nor to ?guide or monitor treatment. ?Performed at [facility], [place] [address], [place], [place] ?[address] ?  ? ?No results found. ? ?Review of Systems  no recent f/c/n/v/wt loss ? ?Blood pressure (!) 146/90, pulse 70, temperature 97.8 ?F (36.6 ?C), temperature source Oral, resp. rate 15, height '5\' 3"'$  (1.6 m), weight 79.9 kg, SpO2 98 %. ?Physical Exam  ?68 wd woman in  nad.  A and O x 4.  Normal mood and affect.  EOMI.  Resp unlabored.  R ankle with healthy skin.  Pulses are palpable in the foot.  No lymphadenopathy.  5/5 strength in PF and DF of the ankle and toes.  Intact sens to LT dorsally and plantarly at the forefoot. ? ?Assessment/Plan ?R ankle arthritis.  To the OR today for right total ankle replacement.  The risks and benefits of the alternative treatment options have been discussed in detail.  The patient wishes to proceed with surgery and specifically understands risks of bleeding, infection, nerve damage, blood clots, need for additional surgery, amputation and death.  ? ?Wylene Simmer, MD ?21-May-2021, 7:16 AM ? ? ? ?

## 2021-04-27 NOTE — Anesthesia Procedure Notes (Signed)
Anesthesia Regional Block: Popliteal block  ? ?Pre-Anesthetic Checklist: , timeout performed,  Correct Patient, Correct Site, Correct Laterality,  Correct Procedure, Correct Position, site marked,  Risks and benefits discussed,  Surgical consent,  Pre-op evaluation,  At surgeon's request and post-op pain management ? ?Laterality: Right ? ?Prep: Maximum Sterile Barrier Precautions used, chloraprep     ?  ?Needles:  ?Injection technique: Single-shot ? ?Needle Type: Echogenic Stimulator Needle   ? ? ?Needle Length: 9cm  ?Needle Gauge: 22  ? ? ? ?Additional Needles: ? ? ?Procedures:,,,, ultrasound used (permanent image in chart),,    ?Narrative:  ?Start time: 04/27/2021 6:03 AM ?End time: 04/27/2021 6:07 AM ?Injection made incrementally with aspirations every 5 mL. ? ?Performed by: Personally  ?Anesthesiologist: Freddrick March, MD ? ?Additional Notes: ?Monitors applied. No increased pain on injection. No increased resistance to injection. Injection made in 5cc increments. Good needle visualization. Patient tolerated procedure well.  ? ? ? ? ?

## 2021-04-27 NOTE — Progress Notes (Signed)
Assisted Dr. Woodrum with right, adductor canal, popliteal, ultrasound guided block. Side rails up, monitors on throughout procedure. See vital signs in flow sheet. Tolerated Procedure well. 

## 2021-04-27 NOTE — Anesthesia Preprocedure Evaluation (Addendum)
Anesthesia Evaluation  ?Patient identified by MRN, date of birth, ID band ?Patient awake ? ? ? ?Reviewed: ?Allergy & Precautions, NPO status , Patient's Chart, lab work & pertinent test results ? ?History of Anesthesia Complications ?(+) PONV and history of anesthetic complications ? ?Airway ?Mallampati: III ? ?TM Distance: >3 FB ?Neck ROM: Full ? ? ? Dental ?no notable dental hx. ?(+) Teeth Intact, Dental Advisory Given ?  ?Pulmonary ?asthma , former smoker,  ?  ?Pulmonary exam normal ?breath sounds clear to auscultation ? ? ? ? ? ? Cardiovascular ?hypertension, Pt. on medications ?+ Peripheral Vascular Disease  ?Normal cardiovascular exam ?Rhythm:Regular Rate:Normal ? ? ?  ?Neuro/Psych ?negative neurological ROS ? negative psych ROS  ? GI/Hepatic ?Neg liver ROS, GERD  ,  ?Endo/Other  ?Hypothyroidism  ? Renal/GU ?negative Renal ROS  ?negative genitourinary ?  ?Musculoskeletal ?negative musculoskeletal ROS ?(+)  ? Abdominal ?  ?Peds ? Hematology ?negative hematology ROS ?(+)   ?Anesthesia Other Findings ? ? Reproductive/Obstetrics ? ?  ? ? ? ? ? ? ? ? ? ? ? ? ? ?  ?  ? ? ? ? ? ? ?Anesthesia Physical ?Anesthesia Plan ? ?ASA: 3 ? ?Anesthesia Plan: General and Regional  ? ?Post-op Pain Management: Tylenol PO (pre-op)* and Regional block*  ? ?Induction: Intravenous ? ?PONV Risk Score and Plan: 4 or greater and Ondansetron, Dexamethasone, Midazolam and Diphenhydramine ? ?Airway Management Planned: LMA ? ?Additional Equipment:  ? ?Intra-op Plan:  ? ?Post-operative Plan: Extubation in OR ? ?Informed Consent: I have reviewed the patients History and Physical, chart, labs and discussed the procedure including the risks, benefits and alternatives for the proposed anesthesia with the patient or authorized representative who has indicated his/her understanding and acceptance.  ? ? ? ?Dental advisory given ? ?Plan Discussed with: CRNA ? ?Anesthesia Plan Comments:   ? ? ? ? ? ?Anesthesia Quick  Evaluation ? ?

## 2021-04-27 NOTE — Anesthesia Procedure Notes (Signed)
Anesthesia Regional Block: Adductor canal block  ? ?Pre-Anesthetic Checklist: , timeout performed,  Correct Patient, Correct Site, Correct Laterality,  Correct Procedure, Correct Position, site marked,  Risks and benefits discussed,  Surgical consent,  Pre-op evaluation,  At surgeon's request and post-op pain management ? ?Laterality: Right ? ?Prep: Maximum Sterile Barrier Precautions used, chloraprep     ?  ?Needles:  ?Injection technique: Single-shot ? ?Needle Type: Echogenic Stimulator Needle   ? ? ?Needle Length: 9cm  ?Needle Gauge: 22  ? ? ? ?Additional Needles: ? ? ?Procedures:,,,, ultrasound used (permanent image in chart),,    ?Narrative:  ?Start time: 04/27/2021 6:07 AM ?End time: 04/27/2021 6:10 AM ?Injection made incrementally with aspirations every 5 mL. ? ?Performed by: Personally  ?Anesthesiologist: Freddrick March, MD ? ?Additional Notes: ?Monitors applied. No increased pain on injection. No increased resistance to injection. Injection made in 5cc increments. Good needle visualization. Patient tolerated procedure well.  ? ? ? ? ?

## 2021-05-03 ENCOUNTER — Encounter (HOSPITAL_BASED_OUTPATIENT_CLINIC_OR_DEPARTMENT_OTHER): Payer: Self-pay | Admitting: Orthopedic Surgery

## 2021-06-09 DIAGNOSIS — M25571 Pain in right ankle and joints of right foot: Secondary | ICD-10-CM | POA: Diagnosis not present

## 2021-06-16 DIAGNOSIS — M25571 Pain in right ankle and joints of right foot: Secondary | ICD-10-CM | POA: Diagnosis not present

## 2021-06-16 DIAGNOSIS — R269 Unspecified abnormalities of gait and mobility: Secondary | ICD-10-CM | POA: Diagnosis not present

## 2021-06-19 DIAGNOSIS — M25571 Pain in right ankle and joints of right foot: Secondary | ICD-10-CM | POA: Diagnosis not present

## 2021-06-21 DIAGNOSIS — M25571 Pain in right ankle and joints of right foot: Secondary | ICD-10-CM | POA: Diagnosis not present

## 2021-06-27 DIAGNOSIS — I10 Essential (primary) hypertension: Secondary | ICD-10-CM | POA: Diagnosis not present

## 2021-06-27 DIAGNOSIS — M25571 Pain in right ankle and joints of right foot: Secondary | ICD-10-CM | POA: Diagnosis not present

## 2021-06-28 DIAGNOSIS — G43909 Migraine, unspecified, not intractable, without status migrainosus: Secondary | ICD-10-CM | POA: Diagnosis not present

## 2021-06-30 DIAGNOSIS — M25571 Pain in right ankle and joints of right foot: Secondary | ICD-10-CM | POA: Diagnosis not present

## 2021-07-03 DIAGNOSIS — M25571 Pain in right ankle and joints of right foot: Secondary | ICD-10-CM | POA: Diagnosis not present

## 2021-07-06 DIAGNOSIS — M25571 Pain in right ankle and joints of right foot: Secondary | ICD-10-CM | POA: Diagnosis not present

## 2021-07-10 DIAGNOSIS — I10 Essential (primary) hypertension: Secondary | ICD-10-CM | POA: Diagnosis not present

## 2021-07-12 DIAGNOSIS — M25571 Pain in right ankle and joints of right foot: Secondary | ICD-10-CM | POA: Diagnosis not present

## 2021-07-14 DIAGNOSIS — M25571 Pain in right ankle and joints of right foot: Secondary | ICD-10-CM | POA: Diagnosis not present

## 2021-07-17 DIAGNOSIS — M25571 Pain in right ankle and joints of right foot: Secondary | ICD-10-CM | POA: Diagnosis not present

## 2021-07-19 ENCOUNTER — Telehealth: Payer: Self-pay

## 2021-07-19 NOTE — Telephone Encounter (Signed)
Called to discuss PREP program, will be continuing rehab for another month, wants husband to participate with her, will need referral for him. She will contact me to let me know if they can both attend the August 8th class at [facility].

## 2021-07-23 ENCOUNTER — Encounter (HOSPITAL_BASED_OUTPATIENT_CLINIC_OR_DEPARTMENT_OTHER): Payer: Self-pay | Admitting: Emergency Medicine

## 2021-07-23 ENCOUNTER — Emergency Department (HOSPITAL_BASED_OUTPATIENT_CLINIC_OR_DEPARTMENT_OTHER): Payer: Medicare Other

## 2021-07-23 ENCOUNTER — Other Ambulatory Visit: Payer: Self-pay

## 2021-07-23 ENCOUNTER — Observation Stay (HOSPITAL_BASED_OUTPATIENT_CLINIC_OR_DEPARTMENT_OTHER)
Admission: EM | Admit: 2021-07-23 | Discharge: 2021-07-25 | Disposition: A | Discharge status: Home / Self Care | Payer: Medicare Other | Attending: Family Medicine | Admitting: Family Medicine

## 2021-07-23 DIAGNOSIS — Z79899 Other long term (current) drug therapy: Secondary | ICD-10-CM | POA: Insufficient documentation

## 2021-07-23 DIAGNOSIS — I10 Essential (primary) hypertension: Secondary | ICD-10-CM | POA: Diagnosis not present

## 2021-07-23 DIAGNOSIS — Z96651 Presence of right artificial knee joint: Secondary | ICD-10-CM | POA: Insufficient documentation

## 2021-07-23 DIAGNOSIS — J45909 Unspecified asthma, uncomplicated: Secondary | ICD-10-CM

## 2021-07-23 DIAGNOSIS — Z79890 Hormone replacement therapy (postmenopausal): Secondary | ICD-10-CM

## 2021-07-23 DIAGNOSIS — R2990 Unspecified symptoms and signs involving the nervous system: Secondary | ICD-10-CM

## 2021-07-23 DIAGNOSIS — E039 Hypothyroidism, unspecified: Secondary | ICD-10-CM | POA: Insufficient documentation

## 2021-07-23 DIAGNOSIS — Z87891 Personal history of nicotine dependence: Secondary | ICD-10-CM | POA: Diagnosis not present

## 2021-07-23 DIAGNOSIS — I739 Peripheral vascular disease, unspecified: Secondary | ICD-10-CM | POA: Diagnosis not present

## 2021-07-23 DIAGNOSIS — E785 Hyperlipidemia, unspecified: Secondary | ICD-10-CM

## 2021-07-23 DIAGNOSIS — I639 Cerebral infarction, unspecified: Secondary | ICD-10-CM | POA: Diagnosis not present

## 2021-07-23 DIAGNOSIS — R531 Weakness: Secondary | ICD-10-CM | POA: Diagnosis present

## 2021-07-23 LAB — COMPREHENSIVE METABOLIC PANEL
ALT: 10 U/L (ref 0–44)
AST: 13 U/L — ABNORMAL LOW (ref 15–41)
Albumin: 4 g/dL (ref 3.5–5.0)
Alkaline Phosphatase: 92 U/L (ref 38–126)
Anion gap: 9 (ref 5–15)
BUN: 9 mg/dL (ref 8–23)
CO2: 22 mmol/L (ref 22–32)
Calcium: 8.8 mg/dL — ABNORMAL LOW (ref 8.9–10.3)
Chloride: 105 mmol/L (ref 98–111)
Creatinine, Ser: 0.69 mg/dL (ref 0.44–1.00)
GFR, Estimated: >60 mL/min (ref >60)
Glucose, Bld: 91 mg/dL (ref 70–99)
Potassium: 3.5 mmol/L (ref 3.5–5.1)
Sodium: 136 mmol/L (ref 135–145)
Total Bilirubin: 0.4 mg/dL (ref 0.3–1.2)
Total Protein: 7 g/dL (ref 6.5–8.1)

## 2021-07-23 LAB — CBC
HCT: 38.8 % (ref 36.0–46.0)
Hemoglobin: 12.6 g/dL (ref 12.0–15.0)
MCH: 29.6 pg (ref 26.0–34.0)
MCHC: 32.5 g/dL (ref 30.0–36.0)
MCV: 91.3 fL (ref 80.0–100.0)
Platelets: 385 10*3/uL (ref 150–400)
RBC: 4.25 MIL/uL (ref 3.87–5.11)
RDW: 12.6 % (ref 11.5–15.5)
WBC: 10.1 10*3/uL (ref 4.0–10.5)
nRBC: 0 % (ref 0.0–0.2)

## 2021-07-23 LAB — URINALYSIS, ROUTINE W REFLEX MICROSCOPIC
Bilirubin Urine: NEGATIVE
Glucose, UA: NEGATIVE mg/dL
Hgb urine dipstick: NEGATIVE
Ketones, ur: NEGATIVE mg/dL
Leukocytes,Ua: NEGATIVE
Nitrite: NEGATIVE
Protein, ur: NEGATIVE mg/dL
Specific Gravity, Urine: <=1.005 (ref 1.005–1.030)
pH: 5.5 (ref 5.0–8.0)

## 2021-07-23 LAB — DIFFERENTIAL
Abs Immature Granulocytes: 0.03 10*3/uL (ref 0.00–0.07)
Basophils Absolute: 0.1 10*3/uL (ref 0.0–0.1)
Basophils Relative: 1 %
Eosinophils Absolute: 0.3 10*3/uL (ref 0.0–0.5)
Eosinophils Relative: 3 %
Immature Granulocytes: 0 %
Lymphocytes Relative: 40 %
Lymphs Abs: 4 10*3/uL (ref 0.7–4.0)
Monocytes Absolute: 0.6 10*3/uL (ref 0.1–1.0)
Monocytes Relative: 6 %
Neutro Abs: 5.1 10*3/uL (ref 1.7–7.7)
Neutrophils Relative %: 50 %

## 2021-07-23 LAB — CBG MONITORING, ED: Glucose-Capillary: 84 mg/dL (ref 70–99)

## 2021-07-23 MED ORDER — ASPIRIN 81 MG PO CHEW
81.0000 mg | CHEWABLE_TABLET | Freq: Every day | ORAL | Status: DC
  Administered 2021-07-23 – 2021-07-25 (×3): 81 mg via ORAL
  Filled 2021-07-23 (×3): qty 1

## 2021-07-23 MED ORDER — LOSARTAN POTASSIUM 50 MG PO TABS
100.0000 mg | ORAL_TABLET | Freq: Every day | ORAL | Status: DC
  Administered 2021-07-24 – 2021-07-25 (×2): 100 mg via ORAL
  Filled 2021-07-23: qty 2
  Filled 2021-07-23: qty 4

## 2021-07-23 MED ORDER — HYDROCHLOROTHIAZIDE 25 MG PO TABS: 12.5000 mg | ORAL_TABLET | Freq: Every day | ORAL | Status: DC

## 2021-07-23 MED ORDER — IOHEXOL 350 MG/ML SOLN
100.0000 mL | Freq: Once | INTRAVENOUS | Status: AC | PRN
  Administered 2021-07-23: 100 mL via INTRAVENOUS

## 2021-07-23 MED ORDER — CLOPIDOGREL BISULFATE 300 MG PO TABS
300.0000 mg | ORAL_TABLET | Freq: Once | ORAL | Status: AC
  Administered 2021-07-23: 300 mg via ORAL
  Filled 2021-07-23: qty 1

## 2021-07-23 MED ORDER — GABAPENTIN 300 MG PO CAPS
600.0000 mg | ORAL_CAPSULE | Freq: Three times a day (TID) | ORAL | Status: DC
  Administered 2021-07-24 – 2021-07-25 (×4): 600 mg via ORAL
  Filled 2021-07-23 (×6): qty 2

## 2021-07-23 NOTE — ED Provider Notes (Addendum)
[place] EMERGENCY DEPARTMENT Provider Note   CSN: [id number] Arrival date & time: 07/23/21  2051     History  Chief Complaint  Patient presents with   Extremity Weakness    Monica Hinton is a 76 y.o. female.  Presented to the emergency room due to concern for weakness.  Patient states that she went to bed about 10 PM last night, was not aware of any symptoms when she went to bed.  When she woke up this morning she felt like her arm on the right side is a little bit weak.  She also noticed while walking her right leg seemed a little bit weak.  Symptoms have been relatively constant throughout the day today, has not noted any alleviating or aggravating factors.  She does not have any associated numbness, she has not noted any speech or vision changes.  Her husband confirms that her mental status has remained stable throughout the day today.  She has been able to walk but feels like her leg has been slow.  She denies prior history of stroke.  HPI     Home Medications Prior to Admission medications   Medication Sig Start Date End Date Taking? Authorizing Provider  acetaminophen (TYLENOL) 500 MG tablet Take 1,000 mg by mouth in the morning, at noon, in the evening, and at bedtime.    [provider]  aspirin EC 81 MG tablet Take 1 tablet (81 mg total) by mouth 2 (two) times daily. 04/27/21   Corky Sing, PA-C  cyclobenzaprine (FLEXERIL) 10 MG tablet Take 1 tablet (10 mg total) by mouth 3 (three) times daily as needed for muscle spasms. 09/11/19   Viona Gilmore D, NP  docusate sodium (COLACE) 100 MG capsule Take 1 capsule (100 mg total) by mouth 2 (two) times daily. 09/11/19   Viona Gilmore D, NP  estradiol (ESTRACE) 1 MG tablet Take 1 mg by mouth daily. 08/07/19   [provider]  gabapentin (NEURONTIN) 300 MG capsule Take 600 mg by mouth in the morning, at noon, in the evening, and at bedtime. 06/08/19   [provider]  hydrochlorothiazide  (HYDRODIURIL) 12.5 MG tablet Take 12.5 mg by mouth daily. 06/18/19   [provider]  levothyroxine (SYNTHROID) 112 MCG tablet Take 112 mcg by mouth daily before breakfast. 06/29/19   [provider]  montelukast (SINGULAIR) 10 MG tablet Take 10 mg by mouth at bedtime.    [provider]  RABEprazole (ACIPHEX) 20 MG tablet Take 20 mg by mouth daily before breakfast.     [provider]  rosuvastatin (CRESTOR) 10 MG tablet Take 10 mg by mouth daily.     [provider]  senna (SENOKOT) 8.6 MG TABS tablet Take 2 tablets (17.2 mg total) by mouth 2 (two) times daily. 04/27/21   Corky Sing, PA-C  Vitamin D, Ergocalciferol, (DRISDOL) 1.25 MG (50000 UNIT) CAPS capsule Take 50,000 Units by mouth every Monday. 07/31/19   [provider]      Allergies    Yellow jacket venom [bee venom], Codeine, Other, Penicillins, and Chlorhexidine    Review of Systems   Review of Systems  Constitutional:  Negative for chills and fever.  HENT:  Negative for ear pain and sore throat.   Eyes:  Negative for pain and visual disturbance.  Respiratory:  Negative for cough and shortness of breath.   Cardiovascular:  Negative for chest pain and palpitations.  Gastrointestinal:  Negative for abdominal pain and vomiting.  Genitourinary:  Negative for dysuria and hematuria.  Musculoskeletal:  Negative for arthralgias and back pain.  Skin:  Negative for color change and rash.  Neurological:  Positive for weakness. Negative for seizures and syncope.  All other systems reviewed and are negative.   Physical Exam Updated Vital Signs BP (!) 151/77   Pulse 89   Temp 98.2 F (36.8 C)   Resp 16   Ht '5\' 3"'$  (1.6 m)   Wt 76.7 kg   SpO2 97%   BMI 29.94 kg/m  Physical Exam Vitals and nursing note reviewed.  Constitutional:      General: She is not in acute distress.    Appearance: She is well-developed.  HENT:     Head: Normocephalic and atraumatic.  Eyes:      Conjunctiva/sclera: Conjunctivae normal.  Cardiovascular:     Rate and Rhythm: Normal rate and regular rhythm.     Heart sounds: No murmur heard. Pulmonary:     Effort: Pulmonary effort is normal. No respiratory distress.     Breath sounds: Normal breath sounds.  Abdominal:     Palpations: Abdomen is soft.     Tenderness: There is no abdominal tenderness.  Musculoskeletal:        General: No swelling.     Cervical back: Neck supple.  Skin:    General: Skin is warm and dry.     Capillary Refill: Capillary refill takes less than 2 seconds.  Neurological:     Mental Status: She is alert.     Comments: AAOx3 CN 2-12 intact, speech clear visual fields intact 5/5 strength in LUE, LLE 4/5 strength in RUE, RLE Sensation to light touch intact in b/l UE and LE Normal FNF  Psychiatric:        Mood and Affect: Mood normal.     ED Results / Procedures / Treatments   Labs (all labs ordered are listed, but only abnormal results are displayed) Labs Reviewed  COMPREHENSIVE METABOLIC PANEL - Abnormal; Notable for the following components:      Result Value   Calcium 8.8 (*)    AST 13 (*)    All other components within normal limits  CBC  DIFFERENTIAL  URINALYSIS, ROUTINE W REFLEX MICROSCOPIC  CBG MONITORING, ED    EKG EKG Interpretation  Date/Time:  Sunday July 23 2021 21:03:58 EDT Ventricular Rate:  71 PR Interval:  208 QRS Duration: 99 QT Interval:  410 QTC Calculation: 446 R Axis:   86 Text Interpretation: Sinus rhythm Borderline right axis deviation Low voltage, precordial leads Confirmed by Madalyn Rob [phone]) on 07/23/2021 9:48:28 PM  Radiology CT ANGIO HEAD NECK W WO CM  Result Date: 07/23/2021 CLINICAL DATA:  Right-sided weakness and numbness EXAM: CT ANGIOGRAPHY HEAD AND NECK TECHNIQUE: Multidetector CT imaging of the head and neck was performed using the standard protocol during bolus administration of intravenous contrast. Multiplanar CT image reconstructions and  MIPs were obtained to evaluate the vascular anatomy. Carotid stenosis measurements (when applicable) are obtained utilizing NASCET criteria, using the distal internal carotid diameter as the denominator. RADIATION DOSE REDUCTION: This exam was performed according to the departmental dose-optimization program which includes automated exposure control, adjustment of the mA and/or kV according to patient size and/or use of iterative reconstruction technique. CONTRAST:  138m OMNIPAQUE IOHEXOL 350 MG/ML SOLN COMPARISON:  None Available. FINDINGS: CT HEAD FINDINGS Brain: There is no mass, hemorrhage or extra-axial collection. The size and configuration of the ventricles and extra-axial CSF spaces are normal. There is  no acute or chronic infarction. The brain parenchyma is normal. Skull: The visualized skull base, calvarium and extracranial soft tissues are normal. Sinuses/Orbits: No fluid levels or advanced mucosal thickening of the visualized paranasal sinuses. No mastoid or middle ear effusion. The orbits are normal. CTA NECK FINDINGS SKELETON: There is no bony spinal canal stenosis. No lytic or blastic lesion. OTHER NECK: Normal pharynx, larynx and major salivary glands. No cervical lymphadenopathy. Unremarkable thyroid gland. UPPER CHEST: No pneumothorax or pleural effusion. No nodules or masses. AORTIC ARCH: There is no calcific atherosclerosis of the aortic arch. There is no aneurysm, dissection or hemodynamically significant stenosis of the visualized portion of the aorta. Conventional 3 vessel aortic branching pattern. The visualized proximal subclavian arteries are widely patent. RIGHT CAROTID SYSTEM: Normal without aneurysm, dissection or stenosis. LEFT CAROTID SYSTEM: Normal without aneurysm, dissection or stenosis. VERTEBRAL ARTERIES: Left dominant configuration. Both origins are clearly patent. There is no dissection, occlusion or flow-limiting stenosis to the skull base (V1-V3 segments). CTA HEAD FINDINGS  POSTERIOR CIRCULATION: --Vertebral arteries: Normal V4 segments. --Inferior cerebellar arteries: Normal. --Basilar artery: Normal. --Superior cerebellar arteries: Normal. --Posterior cerebral arteries (PCA): Normal. ANTERIOR CIRCULATION: --Intracranial internal carotid arteries: Normal. --Anterior cerebral arteries (ACA): Normal. Both A1 segments are present. Patent anterior communicating artery (a-comm). --Middle cerebral arteries (MCA): Normal. VENOUS SINUSES: As permitted by contrast timing, patent. ANATOMIC VARIANTS: None Review of the MIP images confirms the above findings. IMPRESSION: Normal CTA of the head and neck. Electronically Signed   By: Ulyses Jarred M.D.   On: 07/23/2021 22:24    Procedures .Critical Care  Performed by: Lucrezia Starch, MD Authorized by: Lucrezia Starch, MD   Critical care provider statement:    Critical care time (minutes):  37   Critical care was necessary to treat or prevent imminent or life-threatening deterioration of the following conditions:  CNS failure or compromise   Critical care was time spent personally by me on the following activities:  Development of treatment plan with patient or surrogate, discussions with consultants, evaluation of patient's response to treatment, examination of patient, ordering and review of laboratory studies, ordering and review of radiographic studies, ordering and performing treatments and interventions, pulse oximetry, re-evaluation of patient's condition and review of old charts     Medications Ordered in ED Medications  aspirin chewable tablet 81 mg (has no administration in time range)  clopidogrel (PLAVIX) tablet 300 mg (has no administration in time range)  iohexol (OMNIPAQUE) 350 MG/ML injection 100 mL (100 mLs Intravenous Contrast Given 07/23/21 2152)    ED Course/ Medical Decision Making/ A&P                           Medical Decision Making Amount and/or Complexity of Data Reviewed Labs:  ordered. Radiology: ordered.  Risk OTC drugs. Prescription drug management. Decision regarding hospitalization.   76 year old lady presenting to the ER due to concern for right-sided weakness.  Last known well about 10 PM yesterday.  On physical exam patient is noted to have mild weakness in her right upper extremity and right lower extremity.  Sensation is grossly intact, no other neurologic findings on exam.  VAN negative, outside tPA window, deferred stroke alert.  Check labs, CTA head and neck.  Independently reviewed and interpreted images of CT.  No obvious stroke, bleed, mass.  Per radiologist no acute findings.  Reviewed basic labs, normal glucose, no electrolyte derangement, no anemia.  EKG shows sinus rhythm.  I discussed the symptoms with Dr. Leonel Ramsay.  He agrees symptoms are concerning for acute stroke.  He advises admitting to hospitalist for further management.  Consult [facility] for admit.  I discussed recommendations for antiplatelet agent with Dr. Leonel Ramsay.  He advises asa '81mg'$  and plavix '300mg'$  x 1 followed by '75mg'$  daily. Have placed orders for initial ASA and plavix dosing.      Final Clinical Impression(s) / ED Diagnoses Final diagnoses:  Stroke-like symptoms  Cerebrovascular accident (CVA), unspecified mechanism ([place])    Rx / DC Orders ED Discharge Orders     None         Lucrezia Starch, MD 07/23/21 2247    Lucrezia Starch, MD 07/23/21 2333

## 2021-07-23 NOTE — ED Notes (Signed)
Patient to imaging.

## 2021-07-23 NOTE — ED Triage Notes (Addendum)
Pt woke up at 0730 with R leg and R arm weakness/numbness. She states that she feels they are heavy and difficult to move. No symptoms when she went to bed just before 2200 last night.  Denies confusion, facial droop, aphasia, slurred speech or other sx. She also mentions having back problems and having slept on her couch last night. She questions having tweaked her back on the couch. Pt A&O, speaking clearly and without difficulty. No droop noted.

## 2021-07-23 NOTE — ED Notes (Signed)
Patient assisted on and off BSC with standby assist.

## 2021-07-23 NOTE — ED Notes (Signed)
Patient assisted on and off BSC again with light assistance.

## 2021-07-24 ENCOUNTER — Observation Stay (HOSPITAL_COMMUNITY): Payer: Medicare Other

## 2021-07-24 DIAGNOSIS — I739 Peripheral vascular disease, unspecified: Secondary | ICD-10-CM | POA: Diagnosis not present

## 2021-07-24 DIAGNOSIS — I10 Essential (primary) hypertension: Secondary | ICD-10-CM

## 2021-07-24 DIAGNOSIS — Z87891 Personal history of nicotine dependence: Secondary | ICD-10-CM | POA: Diagnosis not present

## 2021-07-24 DIAGNOSIS — E039 Hypothyroidism, unspecified: Secondary | ICD-10-CM | POA: Diagnosis not present

## 2021-07-24 DIAGNOSIS — I619 Nontraumatic intracerebral hemorrhage, unspecified: Secondary | ICD-10-CM | POA: Diagnosis not present

## 2021-07-24 DIAGNOSIS — R531 Weakness: Secondary | ICD-10-CM | POA: Diagnosis present

## 2021-07-24 DIAGNOSIS — Z79890 Hormone replacement therapy (postmenopausal): Secondary | ICD-10-CM | POA: Diagnosis not present

## 2021-07-24 DIAGNOSIS — Z79899 Other long term (current) drug therapy: Secondary | ICD-10-CM | POA: Diagnosis not present

## 2021-07-24 DIAGNOSIS — J45909 Unspecified asthma, uncomplicated: Secondary | ICD-10-CM | POA: Diagnosis not present

## 2021-07-24 DIAGNOSIS — I639 Cerebral infarction, unspecified: Secondary | ICD-10-CM | POA: Diagnosis present

## 2021-07-24 DIAGNOSIS — Z96651 Presence of right artificial knee joint: Secondary | ICD-10-CM | POA: Diagnosis not present

## 2021-07-24 DIAGNOSIS — I6782 Cerebral ischemia: Secondary | ICD-10-CM | POA: Diagnosis not present

## 2021-07-24 LAB — CBC
HCT: 38.6 % (ref 36.0–46.0)
Hemoglobin: 12.7 g/dL (ref 12.0–15.0)
MCH: 29.6 pg (ref 26.0–34.0)
MCHC: 32.9 g/dL (ref 30.0–36.0)
MCV: 90 fL (ref 80.0–100.0)
Platelets: 353 10*3/uL (ref 150–400)
RBC: 4.29 MIL/uL (ref 3.87–5.11)
RDW: 12.7 % (ref 11.5–15.5)
WBC: 12.3 10*3/uL — ABNORMAL HIGH (ref 4.0–10.5)
nRBC: 0 % (ref 0.0–0.2)

## 2021-07-24 LAB — HEMOGLOBIN A1C
Hgb A1c MFr Bld: 5.4 % (ref 4.8–5.6)
Mean Plasma Glucose: 108.28 mg/dL

## 2021-07-24 LAB — CREATININE, SERUM
Creatinine, Ser: 1.04 mg/dL — ABNORMAL HIGH (ref 0.44–1.00)
GFR, Estimated: 57 mL/min — ABNORMAL LOW (ref >60)

## 2021-07-24 MED ORDER — STROKE: EARLY STAGES OF RECOVERY BOOK
Freq: Once | Status: AC
  Filled 2021-07-24: qty 1

## 2021-07-24 MED ORDER — HEPARIN SODIUM (PORCINE) 5000 UNIT/ML IJ SOLN
5000.0000 [IU] | Freq: Three times a day (TID) | INTRAMUSCULAR | Status: DC
Start: 2021-07-24 — End: 2021-07-25
  Administered 2021-07-24 – 2021-07-25 (×3): 5000 [IU] via SUBCUTANEOUS
  Filled 2021-07-24 (×3): qty 1

## 2021-07-24 MED ORDER — ACETAMINOPHEN 650 MG RE SUPP: 650.0000 mg | RECTAL | Status: DC | PRN

## 2021-07-24 MED ORDER — CLOPIDOGREL BISULFATE 75 MG PO TABS
75.0000 mg | ORAL_TABLET | Freq: Every day | ORAL | Status: DC
  Administered 2021-07-24 – 2021-07-25 (×2): 75 mg via ORAL
  Filled 2021-07-24 (×2): qty 1

## 2021-07-24 MED ORDER — SENNOSIDES-DOCUSATE SODIUM 8.6-50 MG PO TABS: 1.0000 | ORAL_TABLET | Freq: Every evening | ORAL | Status: DC | PRN

## 2021-07-24 MED ORDER — ACETAMINOPHEN 325 MG PO TABS
650.0000 mg | ORAL_TABLET | ORAL | Status: DC | PRN
  Administered 2021-07-24: 650 mg via ORAL
  Filled 2021-07-24: qty 2

## 2021-07-24 MED ORDER — SODIUM CHLORIDE 0.9 % IV SOLN: INTRAVENOUS | Status: DC

## 2021-07-24 MED ORDER — ACETAMINOPHEN 160 MG/5ML PO SOLN: 650.0000 mg | ORAL | Status: DC | PRN

## 2021-07-24 NOTE — ED Notes (Signed)
Assisted to bedside commode

## 2021-07-24 NOTE — Plan of Care (Signed)
[facility] will assume care on arrival to accepting facility. Until arrival, care as per EDP. However, [facility] available 24/7 for questions and assistance.  Nursing staff, please page [facility] Admits and Consults ([phone]) as soon as the patient arrives to the hospital.   

## 2021-07-24 NOTE — ED Notes (Signed)
Introduced self to patient and family at bedside. Currently covering primary RN for break at this time. Patient given cup of ice and cup of water per her request. Denies further needs at this time.

## 2021-07-24 NOTE — Consult Note (Signed)
Neurology Consultation Reason for Consult: Right-sided weakness Referring Physician: Manon Hilding  CC: Right-sided weakness  History is obtained from: Patient, husband  HPI: Monica Hinton is a 76 y.o. female with a history of hypertension, hyperlipidemia who presents with right-sided weakness that started on 7/9 on awakening.  Last known well would be 10 PM the night before.  She states that it has been relatively static since onset.  She denies numbness, states that it is just weak.  She has a history of hypertension and hyperlipidemia, has had some problems with controlling her hypertension recently.  LKW: 7/8:10 PM tpa given?: no, out of window  Past Medical History:  Diagnosis Date   Asthma    seasonal    Bursitis    r hip    GERD (gastroesophageal reflux disease)    High cholesterol    Hypertension    Hypothyroidism    Peripheral vascular disease (HCC)    bilateral LE edema ; edma in hands as well    PONV (postoperative nausea and vomiting)      Family History  Problem Relation Age of Onset   Colon cancer Mother        79s     Social History:  reports that she has quit smoking. Her smoking use included cigarettes. She has a 3.00 pack-year smoking history. She has never used smokeless tobacco. She reports current alcohol use. She reports that she does not use drugs.   Exam: Current vital signs: BP (!) 155/86 (BP Location: Left Arm)   Pulse 92   Temp 98.2 F (36.8 C) (Oral)   Resp 18   Ht '5\' 3"'$  (1.6 m)   Wt 76.7 kg   SpO2 97%   BMI 29.94 kg/m  Vital signs in last 24 hours: Temp:  [98 F (36.7 C)-98.2 F (36.8 C)] 98.2 F (36.8 C) (07/10 2033) Pulse Rate:  [73-102] 92 (07/10 2033) Resp:  [14-19] 18 (07/10 2033) BP: (147-178)/(76-104) 155/86 (07/10 2033) SpO2:  [94 %-100 %] 97 % (07/10 2033)   Physical Exam  Constitutional: Appears well-developed and well-nourished.  Psych: Affect appropriate to situation Eyes: No scleral injection HENT: No OP  obstruction MSK: no joint deformities.  Cardiovascular: Normal rate and regular rhythm.  Respiratory: Effort normal, non-labored breathing GI: Soft.  No distension. There is no tenderness.  Skin: WDI  Neuro: Mental Status: Patient is awake, alert, oriented to person, place, month, year, and situation. Patient is able to give a clear and coherent history. No signs of aphasia or neglect Cranial Nerves: II: Visual Fields are full. Pupils are equal, round, and reactive to light.   III,IV, VI: EOMI without ptosis or diploplia.  V: Facial sensation is symmetric to temperature VII: Facial movement with?  Mild right nasolabial fold flattening VIII: hearing is intact to voice X: Uvula elevates symmetrically XI: Shoulder shrug is symmetric. XII: tongue is midline without atrophy or fasciculations.  Motor: She has 4/5 weakness of the right arm and leg she has pronator drift in the right Sensory: Sensation is symmetric to light touch and temperature in the arms and legs. 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  She will need to be admitted for secondary risk factor modification and therapy evaluations.  Recommendations: -  HgbA1c, fasting lipid panel - MRI of the brain without contrast - Frequent neuro checks - Echocardiogram - Prophylactic therapy-Antiplatelet med: Aspirin - dose '81mg'$  and plavix '75mg'$  daily - Risk factor modification - Telemetry monitoring - PT consult, OT consult, Speech consult - Stroke team to follow    Roland Rack, MD [facility] [phone]  If 7pm- 7am, please page neurology on call as listed in [place].

## 2021-07-24 NOTE — H&P (Signed)
History and Physical    Monica Hinton ZOX:[id number] DOB: 08/16/1945 DOA: 07/23/2021  PCP: Orpah Melter, MD  Monica Hinton coming from: [facility]   I have personally briefly reviewed Monica Hinton's old medical records in [place]  Chief Complaint: right sided weakness 7/9 am of presentation  HPI: Monica Hinton is a 76 y.o. female with medical history significant of  HTN,HLD,hypothyroidism,asthma PVD chronic b/l lower extremity edema who present to [facility] with complaints of right leg and right arm weakness that started 10:30 am of 7/9. Due to progressive symptoms Monica Hinton presented to ED.Monica Hinton notes no numbness associated with episode. She also denies any HA,n/v/d/chest pain/ sob or presyncope.   ED Course:  Vitals: afeb, bp 165/75, hr 80, rr15 sat 98%  WJX:BJYNW no st -t wave changes  Wbc: 10.19m hgb 12.6, plt 385  NA : 136, K 3.5, cr 0.69, ast 13 UA neg CTA neck: NAD   CT HEAD FINDINGS   Brain: There is no mass, hemorrhage or extra-axial collection. The size and configuration of the ventricles and extra-axial CSF spaces are normal. There is no acute or chronic infarction. The brain parenchyma is normal.   Skull: The visualized skull base, calvarium and extracranial soft tissues are normal.   Sinuses/Orbits: No fluid levels or advanced mucosal thickening of the visualized paranasal sinuses. No mastoid or middle ear effusion. The orbits are normal.   Tx plavix asa  Review of Systems: As per HPI otherwise 10 point review of systems negative.   Past Medical History:  Diagnosis Date   Asthma    seasonal    Bursitis    r hip    GERD (gastroesophageal reflux disease)    High cholesterol    Hypertension    Hypothyroidism    [place]    bilateral LE edema ; edma in hands as well    PONV (postoperative nausea and vomiting)     Past Surgical History:  Procedure Laterality Date   bladder      bladder tack  20 years ago    COLONOSCOPY     EYE  SURGERY Bilateral    catracts   hysterectomy      20 years    OPEN SURGICAL REPAIR OF GLUTEAL TENDON Right 09/19/2016   Procedure: Right hip bursectomy and gluteal tendon repair;  Surgeon: AGaynelle Arabian MD;  Location: [facility];  Service: Orthopedics;  Laterality: Right;   TONSILLECTOMY     age 76   TOTAL ANKLE ARTHROPLASTY Right 04/27/2021   Procedure: TOTAL ANKLE ARTHROPLASTY;  Surgeon: HWylene Simmer MD;  Location: [facility]  Service: Orthopedics;  Laterality: Right;     reports that she has quit smoking. Her smoking use included cigarettes. She has a 3.00 pack-year smoking history. She has never used smokeless tobacco. She reports current alcohol use. She reports that she does not use drugs.  Allergies  Allergen Reactions   Yellow Jacket Venom [Bee Venom] Anaphylaxis    Pt has EPI PEN   Codeine Nausea And Vomiting   Other     Lactose Intolerant     Penicillins Hives    Many years - dr does not put pt on this   Chlorhexidine Rash    With preop washes - Monica Hinton states fine to use Chloraprep    Family History  Problem Relation Age of Onset   Colon cancer Mother        629s   Prior to Admission medications   Medication  Sig Start Date End Date Taking? Authorizing Provider  acetaminophen (TYLENOL) 500 MG tablet Take 1,000 mg by mouth 3 (three) times daily.   Yes [provider]  estradiol (ESTRACE) 1 MG tablet Take 1 mg by mouth daily. 08/07/19  Yes [provider]  gabapentin (NEURONTIN) 300 MG capsule Take 600 mg by mouth 3 (three) times daily. 06/08/19  Yes [provider]  levothyroxine (SYNTHROID) 112 MCG tablet Take 112 mcg by mouth daily before breakfast. 06/29/19  Yes [provider]  losartan (COZAAR) 100 MG tablet Take 100 mg by mouth daily. 07/10/21  Yes [provider]  montelukast (SINGULAIR) 10 MG tablet Take 1 tablet by mouth daily.   Yes [provider]  RABEprazole (ACIPHEX) 20 MG tablet Take 20 mg  by mouth daily before breakfast.    Yes [provider]  rosuvastatin (CRESTOR) 10 MG tablet Take 10 mg by mouth daily.    Yes [provider]  Vitamin D, Ergocalciferol, (DRISDOL) 1.25 MG (50000 UNIT) CAPS capsule Take 50,000 Units by mouth every Monday. 07/31/19  Yes [provider]    Physical Exam: Vitals:   07/24/21 1500 07/24/21 1600 07/24/21 1717 07/24/21 1814  BP: (!) 157/85 (!) 162/83 (!) 178/81 (!) 164/88  Pulse: 77 73 78 92  Resp: '15 17 16 16  '$ Temp:    98 F (36.7 C)  TempSrc:    Oral  SpO2: 95% 94% 96% 98%  Weight:      Height:        Vitals:   07/24/21 1500 07/24/21 1600 07/24/21 1717 07/24/21 1814  BP: (!) 157/85 (!) 162/83 (!) 178/81 (!) 164/88  Pulse: 77 73 78 92  Resp: '15 17 16 16  '$ Temp:    98 F (36.7 C)  TempSrc:    Oral  SpO2: 95% 94% 96% 98%  Weight:      Height:       Constitutional: NAD, calm, comfortable Eyes: PERRL, lids and conjunctivae normal ENMT: Mucous membranes are moist. Posterior pharynx clear of any exudate or lesions.Normal dentition.  Neck: normal, supple, no masses, no thyromegaly Respiratory: clear to auscultation bilaterally, no wheezing, no crackles. Normal respiratory effort. No accessory muscle use.  Cardiovascular: Regular rate and rhythm, no murmurs / rubs / gallops. +1-trace extremity edema. 2+ pedal pulses. Abdomen: no tenderness, no masses palpated. No hepatosplenomegaly. Bowel sounds positive.  Musculoskeletal: no clubbing / cyanosis. No joint deformity upper and lower extremities. Good ROM, no contractures. Normal muscle tone.  Skin: no rashes, lesions, ulcers. No induration Neurologic: CN 2-12 grossly intact. Sensation intact, F-N noted mild decrease coordination on rigt,  Strength 5/5 in all 4. Except right up and lower extremity 4/5 Psychiatric: Normal judgment and insight. Alert and oriented x 3. Mild anxiety   Labs on Admission: I have personally reviewed following labs and imaging  studies  CBC: Recent Labs  Lab 07/23/21 2105  WBC 10.1  NEUTROABS 5.1  HGB 12.6  HCT 38.8  MCV 91.3  PLT 630   Basic Metabolic Panel: Recent Labs  Lab 07/23/21 2105  NA 136  K 3.5  CL 105  CO2 22  GLUCOSE 91  BUN 9  CREATININE 0.69  CALCIUM 8.8*   GFR: Estimated Creatinine Clearance: 61.4 mL/min (by C-G formula based on SCr of 0.69 mg/dL). Liver Function Tests: Recent Labs  Lab 07/23/21 2105  AST 13*  ALT 10  ALKPHOS 92  BILITOT 0.4  PROT 7.0  ALBUMIN 4.0   No results for  input(s): "LIPASE", "AMYLASE" in the last 168 hours. No results for input(s): "AMMONIA" in the last 168 hours. Coagulation Profile: No results for input(s): "INR", "PROTIME" in the last 168 hours. Cardiac Enzymes: No results for input(s): "CKTOTAL", "CKMB", "CKMBINDEX", "TROPONINI" in the last 168 hours. BNP (last 3 results) No results for input(s): "PROBNP" in the last 8760 hours. HbA1C: No results for input(s): "HGBA1C" in the last 72 hours. CBG: Recent Labs  Lab 07/23/21 2105  GLUCAP 84   Lipid Profile: No results for input(s): "CHOL", "HDL", "LDLCALC", "TRIG", "CHOLHDL", "LDLDIRECT" in the last 72 hours. Thyroid Function Tests: No results for input(s): "TSH", "T4TOTAL", "FREET4", "T3FREE", "THYROIDAB" in the last 72 hours. Anemia Panel: No results for input(s): "VITAMINB12", "FOLATE", "FERRITIN", "TIBC", "IRON", "RETICCTPCT" in the last 72 hours. Urine analysis:    Component Value Date/Time   COLORURINE YELLOW 07/23/2021 2145   APPEARANCEUR CLEAR 07/23/2021 2145   LABSPEC <=1.005 07/23/2021 2145   PHURINE 5.5 07/23/2021 2145   GLUCOSEU NEGATIVE 07/23/2021 2145   HGBUR NEGATIVE 07/23/2021 2145   [place] NEGATIVE 07/23/2021 2145   [place] NEGATIVE 07/23/2021 2145   PROTEINUR NEGATIVE 07/23/2021 2145   NITRITE NEGATIVE 07/23/2021 2145   LEUKOCYTESUR NEGATIVE 07/23/2021 2145    Radiological Exams on Admission: CT ANGIO HEAD NECK W WO CM  Result Date:  07/23/2021 CLINICAL DATA:  Right-sided weakness and numbness EXAM: CT ANGIOGRAPHY HEAD AND NECK TECHNIQUE: Multidetector CT imaging of the head and neck was performed using the standard protocol during bolus administration of intravenous contrast. Multiplanar CT image reconstructions and MIPs were obtained to evaluate the vascular anatomy. Carotid stenosis measurements (when applicable) are obtained utilizing NASCET criteria, using the distal internal carotid diameter as the denominator. RADIATION DOSE REDUCTION: This exam was performed according to the departmental dose-optimization program which includes automated exposure control, adjustment of the mA and/or kV according to Monica Hinton size and/or use of iterative reconstruction technique. CONTRAST:  133m OMNIPAQUE IOHEXOL 350 MG/ML SOLN COMPARISON:  None Available. FINDINGS: CT HEAD FINDINGS Brain: There is no mass, hemorrhage or extra-axial collection. The size and configuration of the ventricles and extra-axial CSF spaces are normal. There is no acute or chronic infarction. The brain parenchyma is normal. Skull: The visualized skull base, calvarium and extracranial soft tissues are normal. Sinuses/Orbits: No fluid levels or advanced mucosal thickening of the visualized paranasal sinuses. No mastoid or middle ear effusion. The orbits are normal. CTA NECK FINDINGS SKELETON: There is no bony spinal canal stenosis. No lytic or blastic lesion. OTHER NECK: Normal pharynx, larynx and major salivary glands. No cervical lymphadenopathy. Unremarkable thyroid gland. UPPER CHEST: No pneumothorax or pleural effusion. No nodules or masses. AORTIC ARCH: There is no calcific atherosclerosis of the aortic arch. There is no aneurysm, dissection or hemodynamically significant stenosis of the visualized portion of the aorta. Conventional 3 vessel aortic branching pattern. The visualized proximal subclavian arteries are widely patent. RIGHT CAROTID SYSTEM: Normal without aneurysm,  dissection or stenosis. LEFT CAROTID SYSTEM: Normal without aneurysm, dissection or stenosis. VERTEBRAL ARTERIES: Left dominant configuration. Both origins are clearly patent. There is no dissection, occlusion or flow-limiting stenosis to the skull base (V1-V3 segments). CTA HEAD FINDINGS POSTERIOR CIRCULATION: --Vertebral arteries: Normal V4 segments. --Inferior cerebellar arteries: Normal. --Basilar artery: Normal. --Superior cerebellar arteries: Normal. --Posterior cerebral arteries (PCA): Normal. ANTERIOR CIRCULATION: --Intracranial internal carotid arteries: Normal. --Anterior cerebral arteries (ACA): Normal. Both A1 segments are present. Patent anterior communicating artery (a-comm). --Middle cerebral arteries (MCA): Normal. VENOUS SINUSES: As permitted by contrast timing, patent. ANATOMIC  VARIANTS: None Review of the MIP images confirms the above findings. IMPRESSION: Normal CTA of the head and neck. Electronically Signed   By: Ulyses Jarred M.D.   On: 07/23/2021 22:24    EKG: Independently reviewed. See above  Assessment/Plan Right sided weakness r/o TIA/CVA -admit to med tele -place on tia/cva protocol  -SLP/PT/OT -neuro checks per protocol  - echo /mri -continue on asa,plavix  -neuro consult based on result of above   HTN -permissive HTN per protocol -resume home regimen in am   HLD -continue statin   Hypothyroidism -check tsh   Asthma  -no acute exacerbation  -resume singular  -prn nebs   PVD chronic b/l lower extremity edema -no acute issues     DVT prophylaxis: heparin Code Status: full Family Communication: none at bedside Disposition Plan: Monica Hinton  expected to be admitted greater than 2 midnights  Consults called: neurology consult  Admission status: med tele   Clance Boll MD [facility]   If 7PM-7AM, please contact night-coverage [email or web address] Password [facility]  07/24/2021, 7:32 PM

## 2021-07-25 ENCOUNTER — Observation Stay (HOSPITAL_BASED_OUTPATIENT_CLINIC_OR_DEPARTMENT_OTHER): Payer: Medicare Other

## 2021-07-25 DIAGNOSIS — I6389 Other cerebral infarction: Secondary | ICD-10-CM | POA: Diagnosis not present

## 2021-07-25 DIAGNOSIS — I10 Essential (primary) hypertension: Secondary | ICD-10-CM | POA: Diagnosis not present

## 2021-07-25 DIAGNOSIS — E785 Hyperlipidemia, unspecified: Secondary | ICD-10-CM | POA: Diagnosis not present

## 2021-07-25 DIAGNOSIS — Z79890 Hormone replacement therapy (postmenopausal): Secondary | ICD-10-CM | POA: Diagnosis not present

## 2021-07-25 DIAGNOSIS — J45909 Unspecified asthma, uncomplicated: Secondary | ICD-10-CM

## 2021-07-25 DIAGNOSIS — I639 Cerebral infarction, unspecified: Secondary | ICD-10-CM | POA: Diagnosis not present

## 2021-07-25 LAB — RAPID URINE DRUG SCREEN, HOSP PERFORMED
Amphetamines: NOT DETECTED
Barbiturates: NOT DETECTED
Benzodiazepines: NOT DETECTED
Cocaine: NOT DETECTED
Opiates: NOT DETECTED
Tetrahydrocannabinol: NOT DETECTED

## 2021-07-25 LAB — LIPID PANEL
Cholesterol: 151 mg/dL (ref 0–200)
HDL: 60 mg/dL (ref >40)
LDL Cholesterol: 43 mg/dL (ref 0–99)
Total CHOL/HDL Ratio: 2.5 RATIO
Triglycerides: 238 mg/dL — ABNORMAL HIGH (ref <150)
VLDL: 48 mg/dL — ABNORMAL HIGH (ref 0–40)

## 2021-07-25 LAB — ECHOCARDIOGRAM COMPLETE
AR max vel: 1.91 cm2
AV Area VTI: 1.91 cm2
AV Area mean vel: 1.91 cm2
AV Mean grad: 3 mmHg
AV Peak grad: 6.3 mmHg
Ao pk vel: 1.25 m/s
Area-P 1/2: 3.48 cm2
Height: 63 in
S' Lateral: 2.6 cm
Weight: 2704 oz

## 2021-07-25 MED ORDER — ROSUVASTATIN CALCIUM 5 MG PO TABS
10.0000 mg | ORAL_TABLET | Freq: Every day | ORAL | Status: DC
  Administered 2021-07-25: 10 mg via ORAL
  Filled 2021-07-25: qty 2

## 2021-07-25 MED ORDER — ASPIRIN 81 MG PO CHEW: 81.0000 mg | CHEWABLE_TABLET | Freq: Every day | ORAL | 11 refills | Status: AC

## 2021-07-25 MED ORDER — LEVOTHYROXINE SODIUM 112 MCG PO TABS
112.0000 ug | ORAL_TABLET | Freq: Every day | ORAL | Status: DC
  Administered 2021-07-25: 112 ug via ORAL
  Filled 2021-07-25: qty 1

## 2021-07-25 MED ORDER — ACETAMINOPHEN 160 MG/5ML PO SOLN
650.0000 mg | ORAL | Status: DC | PRN
Start: 2021-07-25 — End: 2021-07-25

## 2021-07-25 MED ORDER — ACETAMINOPHEN 650 MG RE SUPP
650.0000 mg | RECTAL | Status: DC | PRN
Start: 2021-07-25 — End: 2021-07-25

## 2021-07-25 MED ORDER — CLOPIDOGREL BISULFATE 75 MG PO TABS: 75.0000 mg | ORAL_TABLET | Freq: Every day | ORAL | 0 refills | Status: DC

## 2021-07-25 MED ORDER — CLOPIDOGREL BISULFATE 75 MG PO TABS: 75.0000 mg | ORAL_TABLET | Freq: Every day | ORAL | 0 refills | Status: AC

## 2021-07-25 MED ORDER — ACETAMINOPHEN 160 MG/5ML PO SOLN
650.0000 mg | ORAL | Status: DC | PRN
Start: 2021-07-25 — End: 2021-07-25
  Administered 2021-07-25: 650 mg via ORAL
  Filled 2021-07-25: qty 20.3

## 2021-07-25 NOTE — Assessment & Plan Note (Signed)
Continue losartan at discharge

## 2021-07-25 NOTE — Progress Notes (Signed)
  Echocardiogram 2D Echocardiogram has been performed.  Monica Hinton F 07/25/2021, 3:57 PM

## 2021-07-25 NOTE — TOC Transition Note (Signed)
Transition of Care [facility]) - CM/SW Discharge Note   Patient Details  Name: Monica Hinton MRN: [id number] Date of Birth: 12-Aug-1945  [facility] CM/SW Contact:  Pollie Friar, RN Phone Number: 07/25/2021, 4:54 PM   Clinical Narrative:    Pt discharging home with outpatient therapy. She prefers to attend at [facility]. Orders in Epic and information on the AVS.  Pt has DME at home if needed.  Pt's spouse can provide needed transportation and supervision at home.  Pt and spouse over see each others medications at home and deny any issues.  Spouse will transport home once discharged.   Final next level of care: OP Rehab Barriers to Discharge: No Barriers Identified   Patient Goals and CMS Choice     Choice offered to / list presented to : Patient  Discharge Placement                       Discharge Plan and Services                                     Social Determinants of Health (SDOH) Interventions     Readmission Risk Interventions     No data to display

## 2021-07-25 NOTE — Evaluation (Signed)
Physical Therapy Evaluation Patient Details Name: Monica Hinton MRN: [id number] DOB: 10/24/1945 Today's Date: 07/25/2021  History of Present Illness  Pt is a 76 y/o F presenting to ED on 7/9 with R sided weakness. 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 PMH includes asthma, GERD, high cholesterol, HTN, hypothyroidism, and PVD.  Clinical Impression  Pt admitted with above diagnosis. Demonstrates Rt sided weakness resulting abnormal gait pattern and elevated fall risk based on BERG balance testing. Recommended patient use her 4ww at home and in community for support and seek continued physical therapy to improve function. Pt currently with functional limitations due to the deficits listed below (see PT Problem List). Pt will benefit from skilled PT to increase their independence and safety with mobility to allow discharge to the venue listed below.          Recommendations for follow up therapy are one component of a multi-disciplinary discharge planning process, led by the attending physician.  Recommendations may be updated based on patient status, additional functional criteria and insurance authorization.  Follow Up Recommendations Outpatient PT ((Neuro rehab))      Assistance Recommended at Discharge Intermittent Supervision/Assistance  Patient can return home with the following  Assistance with cooking/housework;Assist for transportation;Help with stairs or ramp for entrance    Equipment Recommendations None recommended by PT  Recommendations for Other Services       Functional Status Assessment Patient has had a recent decline in their functional status and demonstrates the ability to make significant improvements in function in a reasonable and predictable amount of time.     Precautions / Restrictions Precautions Precautions: Fall Restrictions Weight Bearing Restrictions: No      Mobility  Bed Mobility Overal  bed mobility: Modified Independent                  Transfers Overall transfer level: Needs assistance Equipment used: None Transfers: Sit to/from Stand Sit to Stand: Min guard           General transfer comment: Min guard for safety, slight instability noted. Cues for safety.    Ambulation/Gait Ambulation/Gait assistance: Supervision Gait Distance (Feet): 175 Feet Assistive device: None, Rolling walker (2 wheels) Gait Pattern/deviations: Step-through pattern, Decreased stride length, Decreased stance time - right, Trendelenburg Gait velocity: decreased     General Gait Details: Notable trendelenburg on Rt, with slight Rt knee instability going into genurecurvatum at times but no overt buckling noted. Pt reports no hip weakness at baseline, husband believes there may have been some trendelenburg present prior to CVA (hx of back surgery.) Initially without AD in hallway with close supervision for safety, cues for symmetry and awareness. Trialled RW with some modest improvement in step symmetry and reportable improved confidence.  Stairs            Wheelchair Mobility    Modified Rankin (Stroke Patients Only) Modified Rankin (Stroke Patients Only) Pre-Morbid Rankin Score: No symptoms Modified Rankin: Moderately severe disability     Balance Overall balance assessment: Needs assistance Sitting-balance support: Feet supported, No upper extremity supported Sitting balance-Leahy Scale: Good     Standing balance support: No upper extremity supported Standing balance-Leahy Scale: Fair                   Standardized Balance Assessment Standardized Balance Assessment : Berg Balance Test Berg Balance Test Sit to Stand: Able to stand  independently using hands Standing Unsupported: Able to stand safely 2  minutes Sitting with Back Unsupported but Feet Supported on Floor or Stool: Able to sit safely and securely 2 minutes Stand to Sit: Sits safely with  minimal use of hands Transfers: Able to transfer safely, definite need of hands Standing Unsupported with Eyes Closed: Able to stand 10 seconds safely Standing Ubsupported with Feet Together: Able to place feet together independently and stand for 1 minute with supervision From Standing, Reach Forward with Outstretched Arm: Can reach forward >12 cm safely (5") From Standing Position, Pick up Object from Floor: Able to pick up shoe, needs supervision From Standing Position, Turn to Look Behind Over each Shoulder: Looks behind from both sides and weight shifts well Turn 360 Degrees: Able to turn 360 degrees safely but slowly Standing Unsupported, Alternately Place Feet on Step/Stool: Able to complete >2 steps/needs minimal assist Standing Unsupported, One Foot in Front: Able to take small step independently and hold 30 seconds Standing on One Leg: Tries to lift leg/unable to hold 3 seconds but remains standing independently Total Score: 41         Pertinent Vitals/Pain Pain Assessment Pain Assessment: No/denies pain    Home Living Family/patient expects to be discharged to:: Private residence Living Arrangements: Spouse/significant other Available Help at Discharge: Family Type of Home: House Home Access: Stairs to enter Entrance Stairs-Rails: None Entrance Stairs-Number of Steps: 1 step   Home Layout: Able to live on main level with bedroom/bathroom;Laundry or work area in [employer] Equipment: Horticulturist, commercial (4 wheels);Cane - single point;Cane - quad (knee scooter)      Prior Function Prior Level of Function : Independent/Modified Independent             Mobility Comments: no AD use since ankle surgery ADLs Comments: does IADLs     Hand Dominance   Dominant Hand: Right    Extremity/Trunk Assessment   Upper Extremity Assessment Upper Extremity Assessment: Defer to OT evaluation RUE Deficits / Details: 4/5 strength/ROM, decreased digit  opposition speed and finger to nose test RUE Coordination: decreased fine motor    Lower Extremity Assessment Lower Extremity Assessment: RLE deficits/detail RLE Deficits / Details: Grossly 4 to 4+/5 (weakness more proximal in hip and knee)    Cervical / Trunk Assessment Cervical / Trunk Assessment: Normal  Communication   Communication: No difficulties  Cognition Arousal/Alertness: Awake/alert Behavior During Therapy: WFL for tasks assessed/performed, Anxious Overall Cognitive Status: Within Functional Limits for tasks assessed                                 General Comments: verbose and tangential, suspect this is baseline.        General Comments General comments (skin integrity, edema, etc.): Reviewed stroke s/s BEFAST. Extensive education for safety, recommendations for follow-up, and AD use as needed.    Exercises     Assessment/Plan    PT Assessment Patient needs continued PT services  PT Problem List Decreased strength;Decreased activity tolerance;Decreased balance;Decreased mobility;Decreased coordination;Decreased knowledge of use of DME       PT Treatment Interventions DME instruction;Gait training;Stair training;Functional mobility training;Therapeutic activities;Therapeutic exercise;Balance training;Neuromuscular re-education;Patient/family education    PT Goals (Current goals can be found in the Care Plan section)  Acute Rehab PT Goals Patient Stated Goal: Get well PT Goal Formulation: With patient/family Time For Goal Achievement: 08/01/21 Potential to Achieve Goals: Good    Frequency Min 4X/week     Co-evaluation  AM-PAC PT "6 Clicks" Mobility  Outcome Measure Help needed turning from your back to your side while in a flat bed without using bedrails?: None Help needed moving from lying on your back to sitting on the side of a flat bed without using bedrails?: None Help needed moving to and from a bed to a chair  (including a wheelchair)?: A Little Help needed standing up from a chair using your arms (e.g., wheelchair or bedside chair)?: A Little Help needed to walk in hospital room?: A Little Help needed climbing 3-5 steps with a railing? : A Little 6 Click Score: 20    End of Session Equipment Utilized During Treatment: Gait belt Activity Tolerance: Patient tolerated treatment well Patient left: in bed;with call bell/phone within reach;with bed alarm set;with family/visitor present   PT Visit Diagnosis: Hemiplegia and hemiparesis;Difficulty in walking, not elsewhere classified (R26.2);Unsteadiness on feet (R26.81);Other abnormalities of gait and mobility (R26.89) Hemiplegia - Right/Left: Right Hemiplegia - dominant/non-dominant: Dominant Hemiplegia - caused by: Unspecified    Time: 8206-0156 PT Time Calculation (min) (ACUTE ONLY): 32 min   Charges:   PT Evaluation $PT Eval Low Complexity: 1 Low PT Treatments $Gait Training: 8-22 mins        Candie Mile, PT   [name] 07/25/2021, 12:42 PM

## 2021-07-25 NOTE — Assessment & Plan Note (Addendum)
Right sided weakness MRI with 12 mm acute ischemic nonhemorrhagic posterior L basal ganglia infarct CTA head/neck without acute abnormalities Echo with EF 32-12%, grade 1 diastolic dysfunction Y4M 5.4, LDL 43 Neurology recommending stop estradiol.  DAPTx21 days, then aspirin daily.  Continue crestor.  Follow with neurology outpatient.

## 2021-07-25 NOTE — Care Management Obs Status (Signed)
[place] NOTIFICATION   Patient Details  Name: Monica Hinton MRN: [id number] Date of Birth: [birth year]   Medicare Observation Status Notification Given:  Yes    Pollie Friar, RN 07/25/2021, 4:37 PM

## 2021-07-25 NOTE — Discharge Summary (Signed)
Physician Discharge Summary  Monica Hinton FWY:[id number] DOB: 1945/07/28 DOA: 07/23/2021  PCP: Monica Melter, MD  Admit date: 07/23/2021 Discharge date: 07/25/2021  Time spent: 40 minutes  Recommendations for Outpatient Follow-up:  follow outpatient CBC/CMP Follow with neurology outpatient DAPTx21 days, then aspirin alone Stop estradiol    Discharge Diagnoses:  Principal Problem:   Acute CVA (cerebrovascular accident) [facility]) Active Problems:   Essential hypertension   Dyslipidemia   Asthma, chronic   Hormone replacement therapy   Discharge Condition: stable  Diet recommendation: heart healthy  Filed Weights   07/23/21 2110  Weight: 76.7 kg    History of present illness:   Monica Hinton is Monica Hinton 76 y.o. female with medical history significant of  HTN,HLD,hypothyroidism,asthma PVD chronic b/l lower extremity edema who present to [facility] with complaints of right leg and right arm weakness that started 10:30 am of 7/9. Due to progressive symptoms patient presented to ED.Patient notes no numbness associated with episode. She also denies any HA,n/v/d/chest pain/ sob or presyncope.   She was admitted with stroke.  MRI confirmed this.  Seen by neurology who recommended DAPT x21 days, then aspirin alone.  Continue statin.  STOP estradiol.  Follow outpatient with neurology.  See below for additional details    Hospital Course:  Assessment and Plan: * Acute CVA (cerebrovascular accident) ([place]) Right sided weakness MRI with 12 mm acute ischemic nonhemorrhagic posterior L basal ganglia infarct CTA head/neck without acute abnormalities Echo with EF 27-74%, grade 1 diastolic dysfunction J2I 5.4, LDL 43 Neurology recommending stop estradiol.  DAPTx21 days, then aspirin daily.  Continue crestor.  Follow with neurology outpatient.   Essential hypertension Continue losartan at discharge  Dyslipidemia Continue crestor  Asthma, chronic Continue home meds  Hormone replacement  therapy Stop estradiol     Procedures: Echo IMPRESSIONS     1. Left ventricular ejection fraction, by estimation, is 55 to 60%. The  left ventricle has normal function. The left ventricle has no regional  wall motion abnormalities. Left ventricular diastolic parameters are  consistent with Grade I diastolic  dysfunction (impaired relaxation).   2. Right ventricular systolic function is normal. The right ventricular  size is normal. There is normal pulmonary artery systolic pressure. The  estimated right ventricular systolic pressure is 78.6 mmHg.   3. The mitral valve is normal in structure. No evidence of mitral valve  regurgitation.   4. The aortic valve is tricuspid. There is mild calcification of the  aortic valve. There is mild thickening of the aortic valve. Aortic valve  regurgitation is not visualized. Aortic valve sclerosis/calcification is  present, without any evidence of  aortic stenosis.    Consultations: neurology  Discharge Exam: Vitals:   07/25/21 1231 07/25/21 1634  BP: (!) 137/91 (!) 145/89  Pulse: 86 76  Resp: 20 18  Temp: 97.6 F (36.4 C) 97.6 F (36.4 C)  SpO2: 97% 99%   Improving right sided weakness  General: No acute distress. Cardiovascular: RRR Lungs: unlabored Neurological: improved R sided weakness, basically symmetric strength to upper and lower extremities, CN 2-12 intact Extremities: No clubbing or cyanosis. No edema.  Discharge Instructions   Discharge Instructions     Ambulatory referral to Neurology   Complete by: As directed    Follow up with stroke clinic NP (Monica Hinton or Monica Hinton, if both not available, consider Monica Hinton, or Monica Hinton) at [facility] in about 4 weeks. Thanks.   Ambulatory referral to Neurology   Complete by: As directed  An appointment is requested in approximately: 4 weeks   Ambulatory referral to Occupational Therapy   Complete by: As directed    Ambulatory referral to Physical Therapy    Complete by: As directed    Call MD for:  difficulty breathing, headache or visual disturbances   Complete by: As directed    Call MD for:  extreme fatigue   Complete by: As directed    Call MD for:  hives   Complete by: As directed    Call MD for:  persistant dizziness or light-headedness   Complete by: As directed    Call MD for:  persistant nausea and vomiting   Complete by: As directed    Call MD for:  redness, tenderness, or signs of infection (pain, swelling, redness, odor or green/yellow discharge around incision site)   Complete by: As directed    Call MD for:  severe uncontrolled pain   Complete by: As directed    Call MD for:  temperature >100.4   Complete by: As directed    Diet - low sodium heart healthy   Complete by: As directed    Discharge instructions   Complete by: As directed    You were seen for Monica Hinton stroke.  Your symptoms are improving.  We'll start you on aspirin and plavix for 21 days, then take aspirin alone.  Continue your crestor 10 mg daily.  Stop your estradiol.   Follow up with neurology as an outpatient.  Return for new, recurrent, or worsening symptoms.  Please ask your PCP to request records from this hospitalization so they know what was done and what the next steps will be.   Increase activity slowly   Complete by: As directed       Allergies as of 07/25/2021       Reactions   Yellow Jacket Venom [bee Venom] Anaphylaxis   Pt has EPI PEN   Codeine Nausea And Vomiting   Other    Lactose Intolerant    Penicillins Hives   Many years - dr does not put pt on this   Chlorhexidine Rash   With preop washes - patient states fine to use Chloraprep        Medication List     STOP taking these medications    estradiol 1 MG tablet Commonly known as: ESTRACE       TAKE these medications    acetaminophen 500 MG tablet Commonly known as: TYLENOL Take 1,000 mg by mouth 3 (three) times daily.   aspirin 81 MG chewable tablet Chew 1  tablet (81 mg total) by mouth daily. Start taking on: July 26, 2021   clopidogrel 75 MG tablet Commonly known as: PLAVIX Take 1 tablet (75 mg total) by mouth daily for 18 days. Start taking on: July 26, 2021   gabapentin 300 MG capsule Commonly known as: NEURONTIN Take 600 mg by mouth 3 (three) times daily.   levothyroxine 112 MCG tablet Commonly known as: SYNTHROID Take 112 mcg by mouth daily before breakfast.   losartan 100 MG tablet Commonly known as: COZAAR Take 100 mg by mouth daily.   montelukast 10 MG tablet Commonly known as: SINGULAIR Take 1 tablet by mouth daily.   RABEprazole 20 MG tablet Commonly known as: ACIPHEX Take 20 mg by mouth daily before breakfast.   rosuvastatin 10 MG tablet Commonly known as: CRESTOR Take 10 mg by mouth daily.   Vitamin D (Ergocalciferol) 1.25 MG (50000 UNIT) Caps capsule Commonly known as: DRISDOL  Take 50,000 Units by mouth every Monday.       Allergies  Allergen Reactions   Yellow Jacket Venom [Bee Venom] Anaphylaxis    Pt has EPI PEN   Codeine Nausea And Vomiting   Other     Lactose Intolerant     Penicillins Hives    Many years - dr does not put pt on this   Chlorhexidine Rash    With preop washes - patient states fine to use Chloraprep    Follow-up Information     [facility]. Schedule an appointment as soon as possible for Kinsly Hild visit in 1 month(s).   Specialty: Neurology Why: stroke clinic Contact information: [address] [place]. Schedule an appointment as soon as possible for Eliette Drumwright visit in 1 week(s).   Specialty: Rehabilitation Contact information: [address] [place] [id number] [place] [phone]                 The results of significant diagnostics from this hospitalization (including imaging, microbiology, ancillary and  laboratory) are listed below for reference.    Significant Diagnostic Studies: ECHOCARDIOGRAM COMPLETE  Result Date: 07/25/2021    ECHOCARDIOGRAM REPORT   Patient Name:   Monica Hinton Date of Exam: 07/25/2021 Medical Rec #:  [id number]       Height:       63.0 in Accession #:    [id number]      Weight:       169.0 lb Date of Birth:  10/26/1945      BSA:          1.800 m Patient Age:    76 years        BP:           137/91 mmHg Patient Gender: F               HR:           71 bpm. Exam Location:  Inpatient Procedure: 2D Echo, Cardiac Doppler and Color Doppler Indications:    TIA  History:        Patient has no prior history of Echocardiogram examinations.  Sonographer:    Merrie Roof RDCS Referring Phys: [id number] [place]  1. Left ventricular ejection fraction, by estimation, is 55 to 60%. The left ventricle has normal function. The left ventricle has no regional wall motion abnormalities. Left ventricular diastolic parameters are consistent with Grade I diastolic dysfunction (impaired relaxation).  2. Right ventricular systolic function is normal. The right ventricular size is normal. There is normal pulmonary artery systolic pressure. The estimated right ventricular systolic pressure is 27.0 mmHg.  3. The mitral valve is normal in structure. No evidence of mitral valve regurgitation.  4. The aortic valve is tricuspid. There is mild calcification of the aortic valve. There is mild thickening of the aortic valve. Aortic valve regurgitation is not visualized. Aortic valve sclerosis/calcification is present, without any evidence of aortic stenosis. FINDINGS  Left Ventricle: Left ventricular ejection fraction, by estimation, is 55 to 60%. The left ventricle has normal function. The left ventricle has no regional wall motion abnormalities. The left ventricular internal cavity size was normal in size. There is  no left ventricular hypertrophy. Left ventricular diastolic parameters are  consistent with Grade I diastolic dysfunction (impaired relaxation). Normal left ventricular filling pressure. Right Ventricle: The right ventricular size is normal.  No increase in right ventricular wall thickness. Right ventricular systolic function is normal. There is normal pulmonary artery systolic pressure. The tricuspid regurgitant velocity is 1.95 m/s, and  with an assumed right atrial pressure of 3 mmHg, the estimated right ventricular systolic pressure is 73.2 mmHg. Left Atrium: Left atrial size was normal in size. Right Atrium: Right atrial size was normal in size. Pericardium: There is no evidence of pericardial effusion. Mitral Valve: The mitral valve is normal in structure. Mild mitral annular calcification. No evidence of mitral valve regurgitation. Tricuspid Valve: The tricuspid valve is normal in structure. Tricuspid valve regurgitation is trivial. Aortic Valve: The aortic valve is tricuspid. There is mild calcification of the aortic valve. There is mild thickening of the aortic valve. Aortic valve regurgitation is not visualized. Aortic valve sclerosis/calcification is present, without any evidence of aortic stenosis. Aortic valve mean gradient measures 3.0 mmHg. Aortic valve peak gradient measures 6.2 mmHg. Aortic valve area, by VTI measures 1.91 cm. Pulmonic Valve: The pulmonic valve was grossly normal. Pulmonic valve regurgitation is not visualized. Aorta: The aortic root and ascending aorta are structurally normal, with no evidence of dilitation. IAS/Shunts: No atrial level shunt detected by color flow Doppler.  LEFT VENTRICLE PLAX 2D LVIDd:         3.80 cm   Diastology LVIDs:         2.60 cm   LV e' medial:    10.40 cm/s LV PW:         0.80 cm   LV E/e' medial:  8.8 LV IVS:        0.70 cm   LV e' lateral:   8.55 cm/s LVOT diam:     1.70 cm   LV E/e' lateral: 10.7 LV SV:         51 LV SV Index:   28 LVOT Area:     2.27 cm  RIGHT VENTRICLE RV Basal diam:  3.10 cm RV S prime:     14.50 cm/s  TAPSE (M-mode): 2.6 cm LEFT ATRIUM             Index        RIGHT ATRIUM           Index LA diam:        3.10 cm 1.72 cm/m   RA Area:     20.10 cm LA Vol (A2C):   75.8 ml 42.11 ml/m  RA Volume:   55.40 ml  30.78 ml/m LA Vol (A4C):   36.3 ml 20.17 ml/m LA Biplane Vol: 57.1 ml 31.72 ml/m  AORTIC VALVE AV Area (Vmax):    1.91 cm AV Area (Vmean):   1.91 cm AV Area (VTI):     1.91 cm AV Vmax:           125.00 cm/s AV Vmean:          83.000 cm/s AV VTI:            0.267 m AV Peak Grad:      6.2 mmHg AV Mean Grad:      3.0 mmHg LVOT Vmax:         105.00 cm/s LVOT Vmean:        69.800 cm/s LVOT VTI:          0.225 m LVOT/AV VTI ratio: 0.84  AORTA Ao Root diam: 2.70 cm MITRAL VALVE                TRICUSPID VALVE MV Area (PHT): 3.48  cm     TR Peak grad:   15.2 mmHg MV Decel Time: 218 msec     TR Vmax:        195.00 cm/s MV E velocity: 91.50 cm/s MV Marjie Chea velocity: 103.00 cm/s  SHUNTS MV E/Jencarlo Bonadonna ratio:  0.89         Systemic VTI:  0.22 m                             Systemic Diam: 1.70 cm Sanda Klein MD Electronically signed by Sanda Klein MD Signature Date/Time: 07/25/2021/4:06:38 PM    Final    MR BRAIN WO CONTRAST  Result Date: 07/24/2021 CLINICAL DATA:  Initial evaluation for neuro deficit, stroke suspected. EXAM: MRI HEAD WITHOUT CONTRAST TECHNIQUE: Multiplanar, multiecho pulse sequences of the brain and surrounding structures were obtained without intravenous contrast. COMPARISON:  Prior CT from 07/23/2021. FINDINGS: Brain: Cerebral volume within normal limits for age. Patchy and confluent T2/FLAIR hyperintensity involving the periventricular deep white matter both cerebral hemispheres, most consistent with chronic small vessel ischemic disease, mild-to-moderate in nature. 12 mm acute ischemic nonhemorrhagic perforator type infarcts seen involving the posterior left basal ganglia (series 2, image 32). No associated mass effect. No other evidence for acute or subacute ischemia. Gray-white matter differentiation  otherwise maintained. No other areas of chronic cortical infarction. No acute intracranial hemorrhage. Single punctate chronic microhemorrhage noted at the parasagittal left frontoparietal region, of doubtful significance in isolation. No mass lesion, midline shift or mass effect. No hydrocephalus or extra-axial fluid collection. Pituitary gland suprasellar region within normal limits. Vascular: Major intracranial vascular flow voids are maintained. Skull and upper cervical spine: Craniocervical junction within normal limits. Bone marrow signal intensity normal. No scalp soft tissue abnormality. Sinuses/Orbits: Prior bilateral ocular lens replacement. Mild scattered mucosal thickening noted about the ethmoidal air cells and maxillary sinuses. No significant mastoid effusion. Other: None. IMPRESSION: 1. 12 mm acute ischemic nonhemorrhagic posterior left basal ganglia infarct. 2. Underlying mild to moderate chronic microvascular ischemic disease. Electronically Signed   By: Jeannine Boga M.D.   On: 07/24/2021 23:10   CT ANGIO HEAD NECK W WO CM  Result Date: 07/23/2021 CLINICAL DATA:  Right-sided weakness and numbness EXAM: CT ANGIOGRAPHY HEAD AND NECK TECHNIQUE: Multidetector CT imaging of the head and neck was performed using the standard protocol during bolus administration of intravenous contrast. Multiplanar CT image reconstructions and MIPs were obtained to evaluate the vascular anatomy. Carotid stenosis measurements (when applicable) are obtained utilizing NASCET criteria, using the distal internal carotid diameter as the denominator. RADIATION DOSE REDUCTION: This exam was performed according to the departmental dose-optimization program which includes automated exposure control, adjustment of the mA and/or kV according to patient size and/or use of iterative reconstruction technique. CONTRAST:  163m OMNIPAQUE IOHEXOL 350 MG/ML SOLN COMPARISON:  None Available. FINDINGS: CT HEAD FINDINGS Brain: There  is no mass, hemorrhage or extra-axial collection. The size and configuration of the ventricles and extra-axial CSF spaces are normal. There is no acute or chronic infarction. The brain parenchyma is normal. Skull: The visualized skull base, calvarium and extracranial soft tissues are normal. Sinuses/Orbits: No fluid levels or advanced mucosal thickening of the visualized paranasal sinuses. No mastoid or middle ear effusion. The orbits are normal. CTA NECK FINDINGS SKELETON: There is no bony spinal canal stenosis. No lytic or blastic lesion. OTHER NECK: Normal pharynx, larynx and major salivary glands. No cervical lymphadenopathy. Unremarkable thyroid gland. UPPER CHEST: No pneumothorax  or pleural effusion. No nodules or masses. AORTIC ARCH: There is no calcific atherosclerosis of the aortic arch. There is no aneurysm, dissection or hemodynamically significant stenosis of the visualized portion of the aorta. Conventional 3 vessel aortic branching pattern. The visualized proximal subclavian arteries are widely patent. RIGHT CAROTID SYSTEM: Normal without aneurysm, dissection or stenosis. LEFT CAROTID SYSTEM: Normal without aneurysm, dissection or stenosis. VERTEBRAL ARTERIES: Left dominant configuration. Both origins are clearly patent. There is no dissection, occlusion or flow-limiting stenosis to the skull base (V1-V3 segments). CTA HEAD FINDINGS POSTERIOR CIRCULATION: --Vertebral arteries: Normal V4 segments. --Inferior cerebellar arteries: Normal. --Basilar artery: Normal. --Superior cerebellar arteries: Normal. --Posterior cerebral arteries (PCA): Normal. ANTERIOR CIRCULATION: --Intracranial internal carotid arteries: Normal. --Anterior cerebral arteries (ACA): Normal. Both A1 segments are present. Patent anterior communicating artery (Lynnzie Blackson-comm). --Middle cerebral arteries (MCA): Normal. VENOUS SINUSES: As permitted by contrast timing, patent. ANATOMIC VARIANTS: None Review of the MIP images confirms the above  findings. IMPRESSION: Normal CTA of the head and neck. Electronically Signed   By: Ulyses Jarred M.D.   On: 07/23/2021 22:24    Microbiology: No results found for this or any previous visit (from the past 240 hour(s)).   Labs: Basic Metabolic Panel: Recent Labs  Lab 07/23/21 2105 07/24/21 2052  NA 136  --   K 3.5  --   CL 105  --   CO2 22  --   GLUCOSE 91  --   BUN 9  --   CREATININE 0.69 1.04*  CALCIUM 8.8*  --    Liver Function Tests: Recent Labs  Lab 07/23/21 2105  AST 13*  ALT 10  ALKPHOS 92  BILITOT 0.4  PROT 7.0  ALBUMIN 4.0   No results for input(s): "LIPASE", "AMYLASE" in the last 168 hours. No results for input(s): "AMMONIA" in the last 168 hours. CBC: Recent Labs  Lab 07/23/21 2105 07/24/21 2052  WBC 10.1 12.3*  NEUTROABS 5.1  --   HGB 12.6 12.7  HCT 38.8 38.6  MCV 91.3 90.0  PLT 385 353   Cardiac Enzymes: No results for input(s): "CKTOTAL", "CKMB", "CKMBINDEX", "TROPONINI" in the last 168 hours. BNP: BNP (last 3 results) No results for input(s): "BNP" in the last 8760 hours.  ProBNP (last 3 results) No results for input(s): "PROBNP" in the last 8760 hours.  CBG: Recent Labs  Lab 07/23/21 2105  GLUCAP 84       Signed:  Fayrene Helper MD.  [facility] 07/25/2021, 5:18 PM

## 2021-07-25 NOTE — Assessment & Plan Note (Signed)
Continue crestor 

## 2021-07-25 NOTE — Hospital Course (Signed)
Monica Hinton is [name] 76 y.o. female with medical history significant of  HTN,HLD,hypothyroidism,asthma PVD chronic b/l lower extremity edema who present to [facility] with complaints of right leg and right arm weakness that started 10:30 am of 7/9. Due to progressive symptoms patient presented to ED.Patient notes no numbness associated with episode. She also denies any HA,n/v/d/chest pain/ sob or presyncope.   She was admitted with stroke.  MRI confirmed this.  Seen by neurology who recommended DAPT x21 days, then aspirin alone.  Continue statin.  STOP estradiol.  Follow outpatient with neurology.  See below for additional details

## 2021-07-25 NOTE — Plan of Care (Signed)
  Problem: Education: Goal: Knowledge of General Education information will improve Description: Including pain rating scale, medication(s)/side effects and non-pharmacologic comfort measures Outcome: Adequate for Discharge   Problem: Health Behavior/Discharge Planning: Goal: Ability to manage health-related needs will improve Outcome: Adequate for Discharge   Problem: Clinical Measurements: Goal: Ability to maintain clinical measurements within normal limits will improve Outcome: Adequate for Discharge Goal: Will remain free from infection Outcome: Adequate for Discharge Goal: Diagnostic test results will improve Outcome: Adequate for Discharge Goal: Respiratory complications will improve Outcome: Adequate for Discharge Goal: Cardiovascular complication will be avoided Outcome: Adequate for Discharge   Problem: Activity: Goal: Risk for activity intolerance will decrease Outcome: Adequate for Discharge   Problem: Nutrition: Goal: Adequate nutrition will be maintained Outcome: Adequate for Discharge   Problem: Coping: Goal: Level of anxiety will decrease Outcome: Adequate for Discharge   Problem: Elimination: Goal: Will not experience complications related to bowel motility Outcome: Adequate for Discharge Goal: Will not experience complications related to urinary retention Outcome: Adequate for Discharge   Problem: Pain Managment: Goal: General experience of comfort will improve Outcome: Adequate for Discharge   Problem: Safety: Goal: Ability to remain free from injury will improve Outcome: Adequate for Discharge   Problem: Skin Integrity: Goal: Risk for impaired skin integrity will decrease Outcome: Adequate for Discharge   Problem: Education: Goal: Knowledge of disease or condition will improve Outcome: Adequate for Discharge Goal: Knowledge of secondary prevention will improve (SELECT ALL) Outcome: Adequate for Discharge Goal: Knowledge of patient specific  risk factors will improve (INDIVIDUALIZE FOR PATIENT) Outcome: Adequate for Discharge Goal: Individualized Educational Video(s) Outcome: Adequate for Discharge   Problem: Coping: Goal: Will verbalize positive feelings about self Outcome: Adequate for Discharge Goal: Will identify appropriate support needs Outcome: Adequate for Discharge   Problem: Self-Care: Goal: Ability to participate in self-care as condition permits will improve Outcome: Adequate for Discharge Goal: Verbalization of feelings and concerns over difficulty with self-care will improve Outcome: Adequate for Discharge Goal: Ability to communicate needs accurately will improve Outcome: Adequate for Discharge   Problem: Nutrition: Goal: Risk of aspiration will decrease Outcome: Adequate for Discharge Goal: Dietary intake will improve Outcome: Adequate for Discharge   Problem: Intracerebral Hemorrhage Tissue Perfusion: Goal: Complications of Intracerebral Hemorrhage will be minimized Outcome: Adequate for Discharge   Problem: Ischemic Stroke/TIA Tissue Perfusion: Goal: Complications of ischemic stroke/TIA will be minimized Outcome: Adequate for Discharge   Problem: Spontaneous Subarachnoid Hemorrhage Tissue Perfusion: Goal: Complications of Spontaneous Subarachnoid Hemorrhage will be minimized Outcome: Adequate for Discharge

## 2021-07-25 NOTE — Assessment & Plan Note (Signed)
Stop estradiol

## 2021-07-25 NOTE — Evaluation (Addendum)
Occupational Therapy Evaluation Patient Details Name: Monica Hinton MRN: [id number] DOB: 07/09/1945 Today's Date: 07/25/2021   History of Present Illness Pt is a 76 y/o F presenting to ED on 7/9 with R sided weakness. CT negative, MRI revealing 76m acute ischemia nonhemorrhagic infarct in posterior L basal ganglia. PMH includes asthma, GERD, high cholesterol, HTN, hypothyroidism, and PVD.   Clinical Impression   Pt independent at baseline with ADLs and functional mobility, lives with spouse who can provide assist as needed. Pt currently  mod I - min guard for ADLs, mod I for bed mobility, and min guard for transfers without AD. Pt with decreased ROM, strength, and coordination in RUE, however pt reports it has been improving and is able to use RUE functionally during ADL tasks. Administered squeeze ball for strengthening, and demo'd use. Pt verbalized understanding.  Pt presenting with impairments listed below, will follow acutely. Recommend OP OT at d/c.     Recommendations for follow up therapy are one component of a multi-disciplinary discharge planning process, led by the attending physician.  Recommendations may be updated based on patient status, additional functional criteria and insurance authorization.   Follow Up Recommendations  Outpatient OT    Assistance Recommended at Discharge Set up Supervision/Assistance  Patient can return home with the following Direct supervision/assist for medications management;Direct supervision/assist for financial management;Help with stairs or ramp for entrance;Assist for transportation;Assistance with cooking/housework;A little help with bathing/dressing/bathroom    Functional Status Assessment  Patient has had a recent decline in their functional status and demonstrates the ability to make significant improvements in function in a reasonable and predictable amount of time.  Equipment Recommendations  None recommended by OT (pt has all needed  DME)    Recommendations for Other Services PT consult     Precautions / Restrictions Precautions Precautions: Fall Restrictions Weight Bearing Restrictions: No      Mobility Bed Mobility Overal bed mobility: Modified Independent                  Transfers Overall transfer level: Needs assistance Equipment used: None Transfers: Sit to/from Stand Sit to Stand: Min guard                  Balance Overall balance assessment: Mild deficits observed, not formally tested                                         ADL either performed or assessed with clinical judgement   ADL Overall ADL's : Needs assistance/impaired Eating/Feeding: Modified independent;Sitting   Grooming: Modified independent;Sitting   Upper Body Bathing: Supervision/ safety   Lower Body Bathing: Supervison/ safety   Upper Body Dressing : Supervision/safety   Lower Body Dressing: Supervision/safety   Toilet Transfer: Min guard;Ambulation;Regular TMuseum/gallery exhibitions officerand Hygiene: Supervision/safety       Functional mobility during ADLs: Min guard       Vision Baseline Vision/History: 1 Wears glasses Vision Assessment?: No apparent visual deficits Additional Comments: able to read paragraph without difficulty     Perception     Praxis      Pertinent Vitals/Pain Pain Assessment Pain Assessment: No/denies pain     Hand Dominance Right   Extremity/Trunk Assessment Upper Extremity Assessment Upper Extremity Assessment: RUE deficits/detail RUE Deficits / Details: 4/5 strength/ROM, decreased digit opposition speed and finger to nose test RUE Coordination: decreased fine motor  Lower Extremity Assessment Lower Extremity Assessment: Defer to PT evaluation   Cervical / Trunk Assessment Cervical / Trunk Assessment: Normal   Communication Communication Communication: No difficulties   Cognition Arousal/Alertness: Awake/alert Behavior  During Therapy: WFL for tasks assessed/performed, Anxious Overall Cognitive Status: Within Functional Limits for tasks assessed                                 General Comments: verbose and tangential, suspect this is baseline. Pt scoring 0 on SBT indicative of normal cognition     General Comments  VSS on RA; spouse present in room and supportive during session    Exercises Exercises: Other exercises Other Exercises Other Exercises: squeeze ball x10   Shoulder Instructions      Home Living Family/patient expects to be discharged to:: Private residence Living Arrangements: Spouse/significant other Available Help at Discharge: Family Type of Home: House Home Access: Stairs to enter [employer] of Steps: 1 step Entrance Stairs-Rails: None Home Layout: Able to live on main level with bedroom/bathroom;Laundry or work area in Consulting civil engineer Shower/Tub: Occupational psychologist: [employer]: Conservation officer, nature (2 wheels);BSC/3in1;Shower seat (knee scooter)          Prior Functioning/Environment Prior Level of Function : Independent/Modified Independent             Mobility Comments: no AD use since ankle surgery ADLs Comments: does IADLs        OT Problem List: Decreased strength;Decreased range of motion;Impaired balance (sitting and/or standing);Decreased coordination      OT Treatment/Interventions: Self-care/ADL training;Therapeutic exercise;Therapeutic activities;Patient/family education;Balance training;Energy conservation;Neuromuscular education    OT Goals(Current goals can be found in the care plan section) Acute Rehab OT Goals Patient Stated Goal: to get better OT Goal Formulation: With patient Time For Goal Achievement: 08/08/21 Potential to Achieve Goals: Good ADL Goals Pt Will Perform Grooming: Independently;standing Pt Will Perform Upper Body Dressing:  Independently;sitting;standing Additional ADL Goal #1: pt will be able to perform fine motor coordination task with supervision in prep for ADLs/IADLs  OT Frequency: Min 2X/week    Co-evaluation              AM-PAC OT "6 Clicks" Daily Activity     Outcome Measure Help from another person eating meals?: None Help from another person taking care of personal grooming?: A Little Help from another person toileting, which includes using toliet, bedpan, or urinal?: None Help from another person bathing (including washing, rinsing, drying)?: None Help from another person to put on and taking off regular upper body clothing?: None Help from another person to put on and taking off regular lower body clothing?: A Little 6 Click Score: 22   End of Session Equipment Utilized During Treatment: Gait belt Nurse Communication: Mobility status  Activity Tolerance: Patient tolerated treatment well Patient left: in bed;with call bell/phone within reach;with bed alarm set;with family/visitor present  OT Visit Diagnosis: Unsteadiness on feet (R26.81);Other abnormalities of gait and mobility (R26.89);Muscle weakness (generalized) (M62.81)                Time: 1610-9604 OT Time Calculation (min): 28 min Charges:  OT General Charges $OT Visit: 1 Visit OT Evaluation $OT Eval Low Complexity: 1 Low OT Treatments $Self Care/Home Management : 8-22 mins  Lynnda Child, OTD, OTR/L Acute Rehab [phone]) [phone] - [place] 07/25/2021, 10:25 AM

## 2021-07-25 NOTE — Progress Notes (Addendum)
STROKE TEAM PROGRESS NOTE   INTERVAL HISTORY Patient is awake and alert sitting up in bed. No family at bedside. Patient states her right arm weakness is better but not 100%. She is alert and oriented x 4, right arm with drift, 4/5, right arm 5/5. Left leg 5/5, right leg 4/5. Decreased fine motor on right hand. No facial droop, no aphasia or dysarthria. Sensation equal bilateral. Discussed her taking the estrogen,states she has taken for many ears for hot flashes, explained the increased risk for stroke while taking estrogen.   Vitals:   07/24/21 1814 07/24/21 2033 07/25/21 0002 07/25/21 0840  BP: (!) 164/88 (!) 155/86 138/73 (!) 141/77  Pulse: 92 92 89 80  Resp: '16 18  20  '$ Temp: 98 F (36.7 C) 98.2 F (36.8 C) 97.6 F (36.4 C) 97.7 F (36.5 C)  TempSrc: Oral Oral Oral Oral  SpO2: 98% 97% 97% 99%  Weight:      Height:       CBC:  Recent Labs  Lab 07/23/21 2105 07/24/21 2052  WBC 10.1 12.3*  NEUTROABS 5.1  --   HGB 12.6 12.7  HCT 38.8 38.6  MCV 91.3 90.0  PLT 385 595   Basic Metabolic Panel:  Recent Labs  Lab 07/23/21 2105 07/24/21 2052  NA 136  --   K 3.5  --   CL 105  --   CO2 22  --   GLUCOSE 91  --   BUN 9  --   CREATININE 0.69 1.04*  CALCIUM 8.8*  --    Lipid Panel:  Recent Labs  Lab 07/25/21 0127  CHOL 151  TRIG 238*  HDL 60  CHOLHDL 2.5  VLDL 48*  LDLCALC 43   HgbA1c:  Recent Labs  Lab 07/24/21 2052  HGBA1C 5.4   Urine Drug Screen: No results for input(s): "LABOPIA", "COCAINSCRNUR", "LABBENZ", "AMPHETMU", "THCU", "LABBARB" in the last 168 hours.  Alcohol Level No results for input(s): "ETH" in the last 168 hours.  IMAGING past 24 hours MR BRAIN WO CONTRAST  Result Date: 07/24/2021 CLINICAL DATA:  Initial evaluation for neuro deficit, stroke suspected. EXAM: MRI HEAD WITHOUT CONTRAST TECHNIQUE: Multiplanar, multiecho pulse sequences of the brain and surrounding structures were obtained without intravenous contrast. COMPARISON:  Prior CT from  07/23/2021. FINDINGS: Brain: Cerebral volume within normal limits for age. Patchy and confluent T2/FLAIR hyperintensity involving the periventricular deep white matter both cerebral hemispheres, most consistent with chronic small vessel ischemic disease, mild-to-moderate in nature. 12 mm acute ischemic nonhemorrhagic perforator type infarcts seen involving the posterior left basal ganglia (series 2, image 32). No associated mass effect. No other evidence for acute or subacute ischemia. Gray-white matter differentiation otherwise maintained. No other areas of chronic cortical infarction. No acute intracranial hemorrhage. Single punctate chronic microhemorrhage noted at the parasagittal left frontoparietal region, of doubtful significance in isolation. No mass lesion, midline shift or mass effect. No hydrocephalus or extra-axial fluid collection. Pituitary gland suprasellar region within normal limits. Vascular: Major intracranial vascular flow voids are maintained. Skull and upper cervical spine: Craniocervical junction within normal limits. Bone marrow signal intensity normal. No scalp soft tissue abnormality. Sinuses/Orbits: Prior bilateral ocular lens replacement. Mild scattered mucosal thickening noted about the ethmoidal air cells and maxillary sinuses. No significant mastoid effusion. Other: None. IMPRESSION: 1. 12 mm acute ischemic nonhemorrhagic posterior left basal ganglia infarct. 2. Underlying mild to moderate chronic microvascular ischemic disease. Electronically Signed   By: Jeannine Boga M.D.   On: 07/24/2021 23:10  PHYSICAL EXAM She is alert and oriented x 4, no aphasia or dysarthria, no facial droop.  PERRL, tracks.  right arm with drift, 4/5, right arm 5/5. Left leg 5/5, right leg 4/5. Decreased fine motor on right hand. No ataxia. Sensation equal bilateral.   ASSESSMENT/PLAN Ms. LADONNA VANORDER is a 76 y.o. female with history of  hypertension, hyperlipidemia, hypothyroidism and PVD  and GERD who presents with right-sided weakness that started on 7/9 on awakening.  Last known well would be 10 PM the night before.  She states that it has been relatively static since onset.  She denies numbness, states that it is just weak.  Stroke: Acute posterior Left Basal Ganglia ischemic stroke, likely due to small vessel disease   CTA head & neck Normal CTA of the head and neck. MRI  1. 12 mm acute ischemic nonhemorrhagic posterior left basal ganglia infarct. 2. Underlying mild to moderate chronic microvascular ischemic disease. 2D Echo EF 55 to 60% LDL 43 HgbA1c 5.4 UDS negative VTE prophylaxis - Heparin SQ No antithrombotic prior to admission, now on aspirin 81 mg daily and clopidogrel 75 mg daily for 3 weeks and then aspirin alone. Therapy recommendations: Outpatient PT/OT Disposition:  pending  Hypertension Home meds:  losartan Unstable 150s gradually normalize in 2-3 days Long-term BP goal normotensive  Hyperlipidemia Home meds:  Crestor 10, resumed in hospital LDL 43, goal < 70 Continue statin at discharge  Other Stroke Risk Factors Advanced Age >/= 39  Ocular migraine  Other Active Problems Hypothyroidism  GERD Estrogen use - will stop, pt is in agreement Leukocytosis WBC 12.3  Hospital day # 0  Beulah Gandy DNP, ACNPC-AG   ATTENDING NOTE: I reviewed above note and agree with the assessment and plan. Pt was seen and examined.   76 year old female with history of hypertension, hyperlipidemia, ocular migraine admitted for right-sided weakness.  CT head and neck negative.  MRI showed right BG/CR infarct.  LDL 43, TG 238, A1c 5.4, UDS negative.  EF 55 to 60%.  Creatinine 0.69, WBC 12.3.  On exam, husband and RN are at bedside, patient lying in bed, AAOx3, no aphasia, fluent language, follows simple commands, able to name and repeat.  Mild left nasolabial fold flattening, no gaze palsy, visual field full.  Slight left upper extremity pronator drift, bilateral  lower extremity symmetrical.  Finger-to-nose intact, sensation symmetrical.  Etiology for patient stroke likely due to small vessel disease.  Recommend better BP monitoring at home and BP management.  Patient agreed to discontinue estrogen use.  Continue aspirin 81 and Plavix 75 DAPT for 3 weeks and then aspirin alone.  Continue Crestor 10.  PT started recommend outpatient therapy.  For detailed assessment and plan, please refer to above/below as I have made changes wherever appropriate.   Neurology will sign off. Please call with questions. Pt will follow up with stroke clinic NP at [facility] in about 4 weeks. Thanks for the consult.   Rosalin Hawking, MD PhD Stroke Neurology 07/25/2021 10:56 PM    To contact Stroke Continuity provider, please refer to [email or web address] After hours, contact General Neurology

## 2021-07-25 NOTE — Assessment & Plan Note (Signed)
-   Continue home meds °

## 2021-08-01 NOTE — Progress Notes (Signed)
Cardiology Office Note:    Date:  08/03/2021   ID:  Monica Hinton, DOB 1946-01-11, MRN [id number]  PCP:  Orpah Melter, MD  Cardiologist:  Sinclair Grooms, MD   Referring MD: Orpah Melter, MD   Chief Complaint  Patient presents with   Advice Only    CVA Family history CAD    History of Present Illness:    Monica Hinton is a 76 y.o. female with a family history of CAD. Has personal h/o primary hypertension, hypercholesterolemia, prior tobacco use (25 years ago), and PAD.  Recent acute stroke 07/23/2021 summarized as follows: "* Acute CVA (cerebrovascular accident) (Monica Hinton) Right sided weakness MRI with 12 mm acute ischemic nonhemorrhagic posterior L basal ganglia infarct CTA head/neck without acute abnormalities Echo with EF 81-85%, grade 1 diastolic dysfunction U3J 5.4, LDL 43 Neurology recommending stop estradiol.  DAPTx21 days, then aspirin daily.  Continue crestor.  Follow with neurology outpatient."  Imaging demonstrated a basal ganglia infarct due to microvascular disease.  No concern about embolic stroke.  She has no cardiac complaints.  This appointment was set prior to the above information.  She had concern about her family history, her personal history of hypertension and hyperlipidemia, and whether or not we needed to do any cardiac evaluation to determine if she had excessive CV risk.  She is sedentary.  She does not have any cardiac symptoms.  All risk factors, since the CVA are being treated.  Past Medical History:  Diagnosis Date   Allergic rhinitis    Asthma    seasonal    Bursitis    r hip    GERD (gastroesophageal reflux disease)    High cholesterol    Hypercholesteremia    Hypertension    Hypothyroidism    Hypothyroidism    Insomnia    Leukocytosis    Obesity    Peripheral vascular disease ([place])    bilateral LE edema ; edma in hands as well    PONV (postoperative nausea and vomiting)    Thrombocytosis    Vitamin D deficiency      Past Surgical History:  Procedure Laterality Date   bladder      bladder tack  20 years ago    COLONOSCOPY     EYE SURGERY Bilateral    catracts   hysterectomy      20 years    OPEN SURGICAL REPAIR OF GLUTEAL TENDON Right 09/19/2016   Procedure: Right hip bursectomy and gluteal tendon repair;  Surgeon: Gaynelle Arabian, MD;  Location: [facility];  Service: Orthopedics;  Laterality: Right;   TONSILLECTOMY     age 54    TOTAL ANKLE ARTHROPLASTY Right 04/27/2021   Procedure: TOTAL ANKLE ARTHROPLASTY;  Surgeon: Wylene Simmer, MD;  Location: [facility];  Service: Orthopedics;  Laterality: Right;    Current Medications: Current Meds  Medication Sig   acetaminophen (TYLENOL) 500 MG tablet Take 1,000 mg by mouth 3 (three) times daily.   aspirin 81 MG chewable tablet Chew 1 tablet (81 mg total) by mouth daily.   clopidogrel (PLAVIX) 75 MG tablet Take 1 tablet (75 mg total) by mouth daily for 18 days.   gabapentin (NEURONTIN) 300 MG capsule Take 600 mg by mouth 3 (three) times daily.   levothyroxine (SYNTHROID) 112 MCG tablet Take 112 mcg by mouth daily before breakfast.   losartan (COZAAR) 100 MG tablet Take 100 mg by mouth daily.   montelukast (SINGULAIR) 10 MG tablet Take 1 tablet by mouth  daily.   RABEprazole (ACIPHEX) 20 MG tablet Take 20 mg by mouth daily before breakfast.    rosuvastatin (CRESTOR) 10 MG tablet Take 10 mg by mouth daily.    Vitamin D, Ergocalciferol, (DRISDOL) 1.25 MG (50000 UNIT) CAPS capsule Take 50,000 Units by mouth every Monday.     Allergies:   Yellow jacket venom [bee venom], Codeine, Milk (cow), Other, Penicillins, Procaine, Simvastatin, and Chlorhexidine   Social History   Socioeconomic History   Marital status: Married    Spouse name: Not on file   Number of children: Not on file   Years of education: Not on file   Highest education level: Not on file  Occupational History   Not on file  Tobacco Use   Smoking status: Former    Packs/day:  0.30    Years: 10.00    Total pack years: 3.00    Types: Cigarettes   Smokeless tobacco: Never   Tobacco comments:    25 years ago quit   Vaping Use   Vaping Use: Never used  Substance and Sexual Activity   Alcohol use: Yes    Comment: occasionally wine    Drug use: No   Sexual activity: Not Currently  Other Topics Concern   Not on file  Social History Narrative   Not on file   Social Determinants of Health   Financial Resource Strain: Not on file  Food Insecurity: Not on file  Transportation Needs: Not on file  Physical Activity: Not on file  Stress: Not on file  Social Connections: Not on file     Family History: The patient's family history includes Colon cancer in her mother.  ROS:   Please see the history of present illness.    CVA left her with right-sided weakness that is rapidly improving.  Estrogen therapy was discontinued.  All other systems reviewed and are negative.  EKGs/Labs/Other Studies Reviewed:    The following studies were reviewed today:  ECHOCARDIOGRAM COMPLETE 07/25/2021 IMPRESSIONS   1. Left ventricular ejection fraction, by estimation, is 55 to 60%. The  left ventricle has normal function. The left ventricle has no regional  wall motion abnormalities. Left ventricular diastolic parameters are  consistent with Grade I diastolic  dysfunction (impaired relaxation).   2. Right ventricular systolic function is normal. The right ventricular  size is normal. There is normal pulmonary artery systolic pressure. The  estimated right ventricular systolic pressure is 16.1 mmHg.   3. The mitral valve is normal in structure. No evidence of mitral valve  regurgitation.   4. The aortic valve is tricuspid. There is mild calcification of the  aortic valve. There is mild thickening of the aortic valve. Aortic valve  regurgitation is not visualized. Aortic valve sclerosis/calcification is  present, without any evidence of  aortic stenosis.    EKG:  EKG  performed 07/23/2021 demonstrated normal sinus rhythm with low voltage.  No change when compared to June 2018 tracing.  Recent Labs: 07/23/2021: ALT 10; BUN 9; Potassium 3.5; Sodium 136 07/24/2021: Creatinine, Ser 1.04; Hemoglobin 12.7; Platelets 353  Recent Lipid Panel    Component Value Date/Time   CHOL 151 07/25/2021 0127   TRIG 238 (H) 07/25/2021 0127   HDL 60 07/25/2021 0127   CHOLHDL 2.5 07/25/2021 0127   VLDL 48 (H) 07/25/2021 0127   LDLCALC 43 07/25/2021 0127    Physical Exam:    VS:  BP 128/76   Pulse 93   Ht '5\' 3"'$  (1.6 m)   Wt  174 lb 3.2 oz (79 kg)   SpO2 95%   BMI 30.86 kg/m     Wt Readings from Last 3 Encounters:  08/03/21 174 lb 3.2 oz (79 kg)  07/23/21 169 lb (76.7 kg)  04/27/21 176 lb 2.4 oz (79.9 kg)     GEN: Overweight. No acute distress HEENT: Normal NECK: No JVD. LYMPHATICS: No lymphadenopathy CARDIAC: No murmur. RRR no gallop, or edema. VASCULAR:  Normal Pulses. No bruits. RESPIRATORY:  Clear to auscultation without rales, wheezing or rhonchi  ABDOMEN: Soft, non-tender, non-distended, No pulsatile mass, MUSCULOSKELETAL: No deformity  SKIN: Warm and dry NEUROLOGIC:  Alert and oriented x 3 PSYCHIATRIC:  Normal affect   ASSESSMENT:    1. Family history of early CAD   2. Acute CVA (cerebrovascular accident) ([place])   3. Essential hypertension   4. Dyslipidemia   5. Hormone replacement therapy   6. Diastolic dysfunction    PLAN:    In order of problems listed above:  We discussed preventative therapy.  She needs more physical activity.  Unfortunately physical therapy for her CVA will also be good for her heart as it will give her activity that she was not receiving. Deep brain microvascular stroke.  Already improving. Continue therapy for blood pressure.  Target less than 130/80 mmHg. Continue statin therapy for dyslipidemia. This has been discontinued after the stroke. She denies shortness of breath and therefore no particular action is needed  for this echocardiogram finding revealed at the time of stroke evaluation.   Overall education and awareness concerning primary/secondary risk prevention was discussed in detail: LDL less than 70, hemoglobin A1c less than 7, blood pressure target less than 130/80 mmHg, >150 minutes of moderate aerobic activity per week, avoidance of smoking, weight control (via diet and exercise), and continued surveillance/management of/for obstructive sleep apnea.  Imaging and hospital course from recent stroke admission were reviewed in detail.  This led to significant time commitment.  Medication Adjustments/Labs and Tests Ordered: Current medicines are reviewed at length with the patient today.  Concerns regarding medicines are outlined above.  No orders of the defined types were placed in this encounter.  No orders of the defined types were placed in this encounter.   Patient Instructions  Medication Instructions:  Your physician recommends that you continue on your current medications as directed. Please refer to the Current Medication list given to you today.  *If you need a refill on your cardiac medications before your next appointment, please call your pharmacy*  Lab Work: NONE  Testing/Procedures: NONE  Follow-Up: At [facility], you and your health needs are our priority.  As part of our continuing mission to provide you with exceptional heart care, we have created designated Provider Care Teams.  These Care Teams include your primary Cardiologist (physician) and Advanced Practice Providers (APPs -  Physician Assistants and Nurse Practitioners) who all work together to provide you with the care you need, when you need it.  Your next appointment:   As needed  The format for your next appointment:   In Person  Provider:   Sinclair Grooms, MD {  Important Information About Sugar         Signed, Sinclair Grooms, MD  08/03/2021 2:18 PM    [place]

## 2021-08-03 ENCOUNTER — Ambulatory Visit: Payer: Medicare Other | Admitting: Interventional Cardiology

## 2021-08-03 ENCOUNTER — Encounter: Payer: Self-pay | Admitting: Interventional Cardiology

## 2021-08-03 VITALS — BP 128/76 | HR 93 | Ht 63.0 in | Wt 174.2 lb

## 2021-08-03 DIAGNOSIS — I5189 Other ill-defined heart diseases: Secondary | ICD-10-CM | POA: Diagnosis not present

## 2021-08-03 DIAGNOSIS — E785 Hyperlipidemia, unspecified: Secondary | ICD-10-CM

## 2021-08-03 DIAGNOSIS — I10 Essential (primary) hypertension: Secondary | ICD-10-CM | POA: Diagnosis not present

## 2021-08-03 DIAGNOSIS — I639 Cerebral infarction, unspecified: Secondary | ICD-10-CM | POA: Diagnosis not present

## 2021-08-03 DIAGNOSIS — Z8249 Family history of ischemic heart disease and other diseases of the circulatory system: Secondary | ICD-10-CM | POA: Diagnosis not present

## 2021-08-03 DIAGNOSIS — Z79890 Hormone replacement therapy (postmenopausal): Secondary | ICD-10-CM

## 2021-08-03 NOTE — Patient Instructions (Signed)
Medication Instructions:  Your physician recommends that you continue on your current medications as directed. Please refer to the Current Medication list given to you today.  *If you need a refill on your cardiac medications before your next appointment, please call your pharmacy*  Lab Work: NONE  Testing/Procedures: NONE  Follow-Up: At [facility], you and your health needs are our priority.  As part of our continuing mission to provide you with exceptional heart care, we have created designated Provider Care Teams.  These Care Teams include your primary Cardiologist (physician) and Advanced Practice Providers (APPs -  Physician Assistants and Nurse Practitioners) who all work together to provide you with the care you need, when you need it.  Your next appointment:   As needed  The format for your next appointment:   In Person  Provider:   Sinclair Grooms, MD {  Important Information About [employer]

## 2021-08-07 DIAGNOSIS — E039 Hypothyroidism, unspecified: Secondary | ICD-10-CM | POA: Diagnosis not present

## 2021-08-07 DIAGNOSIS — Z79899 Other long term (current) drug therapy: Secondary | ICD-10-CM | POA: Diagnosis not present

## 2021-08-07 DIAGNOSIS — Z8673 Personal history of transient ischemic attack (TIA), and cerebral infarction without residual deficits: Secondary | ICD-10-CM | POA: Diagnosis not present

## 2021-08-07 DIAGNOSIS — I69354 Hemiplegia and hemiparesis following cerebral infarction affecting left non-dominant side: Secondary | ICD-10-CM | POA: Diagnosis not present

## 2021-08-07 NOTE — Therapy (Signed)
OUTPATIENT OCCUPATIONAL THERAPY NEURO EVALUATION  Patient Name: Monica Hinton MRN: [id number] DOB:[birth year], 76 y.o., female Today's Date: 08/09/2021  PCP: Orpah Melter, MD  REFERRING PROVIDER: PCP: Orpah Melter, MD    OT End of Session - 08/09/21 1455     Visit Number 1    Number of Visits 8    Date for OT Re-Evaluation 09/09/21    Authorization Type UHC MCR    OT Start Time 1400    OT Stop Time 8588    OT Time Calculation (min) 45 min    Activity Tolerance Patient tolerated treatment well    Behavior During Therapy WFL for tasks assessed/performed             Past Medical History:  Diagnosis Date   Allergic rhinitis    Asthma    seasonal    Bursitis    r hip    GERD (gastroesophageal reflux disease)    High cholesterol    Hypercholesteremia    Hypertension    Hypothyroidism    Hypothyroidism    Insomnia    Leukocytosis    Obesity    Peripheral vascular disease ([place])    bilateral LE edema ; edma in hands as well    PONV (postoperative nausea and vomiting)    Thrombocytosis    Vitamin D deficiency    Past Surgical History:  Procedure Laterality Date   bladder      bladder tack  20 years ago    COLONOSCOPY     EYE SURGERY Bilateral    catracts   hysterectomy      20 years    OPEN SURGICAL REPAIR OF GLUTEAL TENDON Right 09/19/2016   Procedure: Right hip bursectomy and gluteal tendon repair;  Surgeon: Gaynelle Arabian, MD;  Location: [facility];  Service: Orthopedics;  Laterality: Right;   TONSILLECTOMY     age 46    TOTAL ANKLE ARTHROPLASTY Right 04/27/2021   Procedure: TOTAL ANKLE ARTHROPLASTY;  Surgeon: Wylene Simmer, MD;  Location: [facility];  Service: Orthopedics;  Laterality: Right;   Patient Active Problem List   Diagnosis Date Noted   Hormone replacement therapy 07/25/2021   Essential hypertension 07/25/2021   Dyslipidemia 07/25/2021   Asthma, chronic 07/25/2021   Acute CVA (cerebrovascular accident) ([place]) 07/24/2021    Spondylolisthesis of lumbar region 09/10/2019   Tendinopathy of right gluteus medius 09/19/2016    ONSET DATE: 07/23/2021   REFERRING DIAG: I63.9 (ICD-10-CM) - Acute CVA (cerebrovascular accident) ([place])   MRI with 12 mm acute ischemic nonhemorrhagic posterior L basal ganglia infarct   THERAPY DIAG:  Other lack of coordination  Muscle weakness (generalized)  Unsteadiness on feet  Rationale for Evaluation and Treatment Rehabilitation  SUBJECTIVE:   SUBJECTIVE STATEMENT: I think I'm doing well Pt accompanied by: significant other  PERTINENT HISTORY: medical history significant of  HTN,HLD,hypothyroidism,asthma PVD chronic b/l lower extremity edema, L4-5 fusion 2021, Rt ankle replacement 04/27/21  PRECAUTIONS: Fall  WEIGHT BEARING RESTRICTIONS No  PAIN:  Are you having pain? No  FALLS: Has patient fallen in last 6 months? No  LIVING ENVIRONMENT: Lives with: lives with their spouse Lives in: House/apartment Pt lives on first floor of 2 story home w/ ramp to enter Has following equipment at home: shower chair, bed side commode, Ramped entry, and rollator  PLOF: Independent, retired, has not driven past year due to ankle, ballroom dancing  PATIENT GOALS get back to 100%  OBJECTIVE:   HAND DOMINANCE: Right  ADLs: Transfers/ambulation related  to ADLs:uses rollator Eating: independent Grooming: independent UB Dressing: independent LB Dressing: independent Toileting: independent Bathing: mod I w/ grab bars and shower stool Tub Shower transfers: supervision   IADLs: Shopping: pt accompanying husband Light housekeeping: hires someone to vacuum/sweep, however pt washing dishes, folding laundry Meal Prep: supervision  Community mobility: relies on husband for transportation currently Medication management: independent Doctor, hospital: independent Handwriting: 100% legible and slower  MOBILITY STATUS:  uses rollator   UPPER EXTREMITY ROM   BUE AROM WNL's  except Rt shoulder flexion 80%      HAND FUNCTION: Grip strength: Right: 41.2 lbs; Left: 42.1 lbs  COORDINATION: 9 Hole Peg test: Right: 28.72 sec; Left: 22.47 sec  SENSATION: WFL  EDEMA: none in UE's   COGNITION: Overall cognitive status: Within functional limits for tasks assessed  VISION: Subjective report: No visual changes from the stroke Baseline vision: Wears glasses for reading only Visual history:  cataract surgery   OBSERVATIONS: Pt w/ recent Rt ankle surgery back in April 2023 w/ some residual weakness, full wt bearing    TODAY'S TREATMENT:  Pt shown basic safety/walker negotiation for getting things out of low cabinets safely w/ proper walker placement   PATIENT EDUCATION: Education details: Teaching laboratory technician w/ kitchen mobility  Person educated: Patient and Spouse Education method: Customer service manager Education comprehension: verbalized understanding   HOME EXERCISE PROGRAM: N/A today    GOALS: Goals reviewed with patient? Yes  LONG TERM GOALS: Target date: 09/09/21  Independent w/ HEP for Rt shoulder ROM/strengthening, grip strengthening, and high level coordination HEP  Baseline:  Goal status: INITIAL  2.  Pt to simulate cooking task and laundry tasks safely using rollator Baseline:  Goal status: INITIAL  3.  Pt to demo full Rt shoulder flexion for reaching into higher cabinets Baseline:  Goal status: INITIAL  4.  Improve Rt hand coordination as evidenced by performing 9 hole peg test in 24 sec or less Baseline: 28.72 sec Goal status: INITIAL  ASSESSMENT:  CLINICAL IMPRESSION: Patient is a 76 y.o. female who was seen today for occupational therapy evaluation s/p Lt BG CVA on 07/23/21 w/ mild residual RUE weakness and coordination deficits. Pt also w/ mild deficits in balance (partly d/t recent Rt ankle surgery exacerbated by stroke). Pt would benefit from short duration of O.T. to address mild remaining deficits and return pt  to PLOF  PERFORMANCE DEFICITS in functional skills including IADLs, coordination, dexterity, ROM, strength, mobility, balance, body mechanics, decreased knowledge of use of DME, and UE functional use,.   IMPAIRMENTS are limiting patient from IADLs, leisure, and social participation.   COMORBIDITIES may have co-morbidities  that affects occupational performance. Patient will benefit from skilled OT to address above impairments and improve overall function.  MODIFICATION OR ASSISTANCE TO COMPLETE EVALUATION: No modification of tasks or assist necessary to complete an evaluation.  OT OCCUPATIONAL PROFILE AND HISTORY: Problem focused assessment: Including review of records relating to presenting problem.  CLINICAL DECISION MAKING: Moderate - several treatment options, min-mod task modification necessary  REHAB POTENTIAL: Good  EVALUATION COMPLEXITY: Low    PLAN: OT FREQUENCY: 2x/week  OT DURATION: 4 weeks (Anticipate only 4 sessions needed)   PLANNED INTERVENTIONS: self care/ADL training, therapeutic exercise, therapeutic activity, neuromuscular re-education, functional mobility training, patient/family education, coping strategies training, and DME and/or AE instructions  RECOMMENDED OTHER SERVICES: none  CONSULTED AND AGREED WITH PLAN OF CARE: Patient and family member/caregiver  PLAN FOR NEXT SESSION: address LTG #1   Hans Eden, OT  08/09/2021, 2:56 PM

## 2021-08-07 NOTE — Progress Notes (Signed)
Received message from her husband updating that she is home and still recovering from her stroke; they were scheduled to begin PREP classes at [facility] together, but they feel she is not ready to participate as she is still undergoing PT/OT; advised to let me know when she is medically cleared to begin exercise program, will need another referral for the program with this new diagnosis.

## 2021-08-09 ENCOUNTER — Ambulatory Visit: Payer: Medicare Other | Attending: Family Medicine

## 2021-08-09 ENCOUNTER — Ambulatory Visit: Payer: Medicare Other | Admitting: Occupational Therapy

## 2021-08-09 DIAGNOSIS — M6281 Muscle weakness (generalized): Secondary | ICD-10-CM | POA: Diagnosis not present

## 2021-08-09 DIAGNOSIS — R2681 Unsteadiness on feet: Secondary | ICD-10-CM | POA: Insufficient documentation

## 2021-08-09 DIAGNOSIS — I639 Cerebral infarction, unspecified: Secondary | ICD-10-CM | POA: Insufficient documentation

## 2021-08-09 DIAGNOSIS — R262 Difficulty in walking, not elsewhere classified: Secondary | ICD-10-CM | POA: Diagnosis not present

## 2021-08-09 DIAGNOSIS — R278 Other lack of coordination: Secondary | ICD-10-CM | POA: Diagnosis not present

## 2021-08-09 NOTE — Therapy (Addendum)
OUTPATIENT PHYSICAL THERAPY NEURO EVALUATION   Patient Name: Monica Hinton MRN: [id number] DOB:[birth year], 76 y.o., female Today's Date: 08/09/2021   PCP: Orpah Melter, MD REFERRING PROVIDER: Elodia Florence., MD    PT End of Session - 08/09/21 1316     Visit Number 1    Number of Visits 21    Date for PT Re-Evaluation 10/18/21    Authorization Type UHC medicare    Progress Note Due on Visit 10    PT Start Time 1315    PT Stop Time 1400    PT Time Calculation (min) 45 min    Equipment Utilized During Treatment Gait belt    Activity Tolerance Patient tolerated treatment well    Behavior During Therapy WFL for tasks assessed/performed;Anxious             Past Medical History:  Diagnosis Date   Allergic rhinitis    Asthma    seasonal    Bursitis    r hip    GERD (gastroesophageal reflux disease)    High cholesterol    Hypercholesteremia    Hypertension    Hypothyroidism    Hypothyroidism    Insomnia    Leukocytosis    Obesity    Peripheral vascular disease ([facility])    bilateral LE edema ; edma in hands as well    PONV (postoperative nausea and vomiting)    Thrombocytosis    Vitamin D deficiency    Past Surgical History:  Procedure Laterality Date   bladder      bladder tack  20 years ago    COLONOSCOPY     EYE SURGERY Bilateral    catracts   hysterectomy      20 years    OPEN SURGICAL REPAIR OF GLUTEAL TENDON Right 09/19/2016   Procedure: Right hip bursectomy and gluteal tendon repair;  Surgeon: Gaynelle Arabian, MD;  Location: [facility];  Service: Orthopedics;  Laterality: Right;   TONSILLECTOMY     age 76    TOTAL ANKLE ARTHROPLASTY Right 04/27/2021   Procedure: TOTAL ANKLE ARTHROPLASTY;  Surgeon: Wylene Simmer, MD;  Location: [facility];  Service: Orthopedics;  Laterality: Right;   Patient Active Problem List   Diagnosis Date Noted   Hormone replacement therapy 07/25/2021   Essential hypertension 07/25/2021   Dyslipidemia  07/25/2021   Asthma, chronic 07/25/2021   Acute CVA (cerebrovascular accident) ([place]) 07/24/2021   Spondylolisthesis of lumbar region 09/10/2019   Tendinopathy of right gluteus medius 09/19/2016    ONSET DATE: 07/23/21  REFERRING DIAG: 63.9 (ICD-10-CM) - Acute CVA (cerebrovascular accident) ([place])   THERAPY DIAG:  Difficulty in walking, not elsewhere classified - Plan: PT plan of care cert/re-cert  Muscle weakness (generalized) - Plan: PT plan of care cert/re-cert  Rationale for Evaluation and Treatment Rehabilitation  SUBJECTIVE:  SUBJECTIVE STATEMENT: Patient reports doing well. No issues since discharging. Had total ankle replacement 04/27/21.  Pt accompanied by: significant other  PERTINENT HISTORY: HTN,HLD,hypothyroidism,asthma PVD chronic b/l lower extremity edema, recent ankle replacement ~04/2021  PAIN:  Are you having pain? No  PRECAUTIONS: Fall  WEIGHT BEARING RESTRICTIONS No  FALLS: Has patient fallen in last 6 months? No  LIVING ENVIRONMENT: Lives with: lives with their family Lives in: House/apartment Stairs:  ramped entrance  Has following equipment at home: Single point cane, Environmental consultant - 2 wheeled, Environmental consultant - 4 wheeled, Crutches, Wheelchair (manual), shower chair, Grab bars, Ramped entry, and knee scooter   PLOF: Independent; not driving   PATIENT GOALS "To get as close to 100% as possible"   OBJECTIVE:   DIAGNOSTIC FINDINGS:  Brain MRI 7/10:12 mm acute ischemic nonhemorrhagic posterior left basal ganglia Infarct; Underlying mild to moderate chronic microvascular ischemic disease.  COGNITION: Overall cognitive status: Within functional limits for tasks assessed   SENSATION: WFL  COORDINATION: WFL finger/nose WFL heel/shin  WFL figure-8   POSTURE: No Significant  postural limitations    LOWER EXTREMITY MMT:    MMT Right Eval Left Eval  Hip flexion 4+ 5  Hip extension    Hip abduction 5 5  Hip adduction 5 5  Hip internal rotation    Hip external rotation    Knee flexion 4 4  Knee extension 4+ 5  Ankle dorsiflexion 3+ 5  Ankle plantarflexion 3+ 5  Ankle inversion    Ankle eversion    (Blank rows = not tested)  BED MOBILITY:  Reports no difficulty   TRANSFERS: Assistive device utilized: Environmental consultant - 4 wheeled  Sit to stand: Modified independence Stand to sit: Modified independence Chair to chair: Modified independence  STAIRS:  Level of Assistance: Min A  Stair Negotiation Technique: Step to Pattern with Bilateral Rails  Number of Stairs: 4   Height of Stairs: 6    GAIT: Gait pattern: step to pattern, decreased step length- Left, decreased stance time- Right, decreased stride length, decreased ankle dorsiflexion- Right, circumduction- Right, Right foot flat, antalgic, lateral hip instability, and poor foot clearance- Right Distance walked: clinic Assistive device utilized: Walker - 4 wheeled Level of assistance: CGA Comments: Patient denies pain with gait, but pattern is very antalgic in nature  FUNCTIONAL TESTs:   [facility] PT Assessment - 08/09/21 0001       Standardized Balance Assessment   Standardized Balance Assessment 10 meter walk test    10 Meter Walk .26ms   w/ rollator (without rollator: .158m)     Functional Gait  Assessment   Gait assessed  Yes    Gait Level Surface Walks 20 ft, slow speed, abnormal gait pattern, evidence for imbalance or deviates 10-15 in outside of the 12 in walkway width. Requires more than 7 sec to ambulate 20 ft.    Change in Gait Speed Makes only minor adjustments to walking speed, or accomplishes a change in speed with significant gait deviations, deviates 10-15 in outside the 12 in walkway width, or changes speed but loses balance but is able to recover and continue walking.    Gait with  Horizontal Head Turns Performs head turns with moderate changes in gait velocity, slows down, deviates 10-15 in outside 12 in walkway width but recovers, can continue to walk.    Gait with Vertical Head Turns Performs task with moderate change in gait velocity, slows down, deviates 10-15 in outside 12 in walkway width but recovers, can continue to walk.  Gait and Pivot Turn Turns slowly, requires verbal cueing, or requires several small steps to catch balance following turn and stop    Step Over Obstacle Cannot perform without assistance.    Gait with Narrow Base of Support Ambulates less than 4 steps heel to toe or cannot perform without assistance.    Gait with Eyes Closed Cannot walk 20 ft without assistance, severe gait deviations or imbalance, deviates greater than 15 in outside 12 in walkway width or will not attempt task.    Ambulating Backwards Walks 20 ft, slow speed, abnormal gait pattern, evidence for imbalance, deviates 10-15 in outside 12 in walkway width.    Steps Two feet to a stair, must use rail.    Total Score 7             TODAY'S TREATMENT:  N/A eval   PATIENT EDUCATION: Education details: PT POC, OM measures Person educated: Patient and Spouse Education method: Explanation Education comprehension: verbalized understanding   HOME EXERCISE PROGRAM: To be provided    GOALS: Goals reviewed with patient? Yes  SHORT TERM GOALS: Target date: 09/13/2021  Pt will be independent with initial HEP for improved balance and gait mechanics  Baseline: to be provided Goal status: INITIAL  2.  Pt will improve FGA to >/= 12/30 to demonstrate improved balance and reduced fall risk  Baseline: 7/30 Goal status: INITIAL  3.  Pt will improve gait speed to >/= .41ms to demonstrate improved community ambulation  Baseline: .257m with rollator  Goal status: INITIAL   LONG TERM GOALS: Target date: 10/18/2021  Pt will be independent with final HEP for improved balance  and gait mechanics   Baseline: to be provided Goal status: INITIAL  2.  Pt will improve FGA to >/= 17/30 to demonstrate improved balance and reduced fall risk   Baseline: 7/30 Goal status: INITIAL  3.  Pt will improve gait speed to >/= .4572mto demonstrate improved community ambulation  Baseline: .84m52mith rollator  Goal status: INITIAL  ASSESSMENT:  CLINICAL IMPRESSION: Patient is a 75 y13. female who was seen today for physical therapy evaluation and treatment for gait disturbance and imbalance related to recent CVA. Her case is complicated by a recent R ankle replacement that she is still healing from. Patient demonstrates increased fall risk as noted by score of 7/30 on  Functional Gait Assessment.   <22/30 = predictive of falls, <20/30 = fall in 6 months, <18/30 = predictive of falls in PD MCID: 5 points stroke population, 4 points geriatric population (ANPTA Core Set of Outcome Measures for Adults with Neurologic Conditions, 2018). 10 Meter Walk Test: Patient instructed to walk 10 meters (32.8 ft) as quickly and as safely as possible at their normal speed x2 and at a fast speed x2. Time measured from 2 meter mark to 8 meter mark to accommodate ramp-up and ramp-down.  Normal speed: .84m/24mut off scores: <0.4 m/s = household Ambulator, 0.4-0.8 m/s = limited community Ambulator, >0.8 m/s = community Ambulator, >1.2 m/s = crossing a street, <1.0 = increased fall risk MCID 0.05 m/s (small), 0.13 m/s (moderate), 0.06 m/s (significant)  (ANPTA Core Set of Outcome Measures for Adults with Neurologic Conditions, 2018). Patient will benefit from skilled PT services in order to address the above mentioned deficits.   OBJECTIVE IMPAIRMENTS Abnormal gait, decreased activity tolerance, decreased balance, decreased endurance, decreased knowledge of condition, decreased knowledge of use of DME, difficulty walking, and decreased strength.   ACTIVITY LIMITATIONS carrying, lifting, bending,  standing, squatting, stairs, and locomotion level  PARTICIPATION LIMITATIONS: meal prep, cleaning, laundry, driving, shopping, and community activity  PERSONAL FACTORS Age, Fitness, Past/current experiences, and 3+ comorbidities: HTN HLD, PVD, LE edema, recent R ankle replacement   are also affecting patient's functional outcome.   REHAB POTENTIAL: Good  CLINICAL DECISION MAKING: Stable/uncomplicated  EVALUATION COMPLEXITY: Low  PLAN: PT FREQUENCY: 2x/week  PT DURATION: 10 weeks  PLANNED INTERVENTIONS: Therapeutic exercises, Therapeutic activity, Neuromuscular re-education, Balance training, Gait training, Patient/Family education, Self Care, Joint mobilization, Joint manipulation, Stair training, Vestibular training, Visual/preceptual remediation/compensation, Orthotic/Fit training, DME instructions, Aquatic Therapy, Dry Needling, Cognitive remediation, Electrical stimulation, Spinal manipulation, Spinal mobilization, Cryotherapy, Moist heat, Manual lymph drainage, scar mobilization, Taping, Manual therapy, and Re-evaluation  PLAN FOR NEXT SESSION: provide HEP, berg assessment   Debbora Dus, PT Debbora Dus, PT, DPT, CBIS  08/09/2021, 2:23 PM

## 2021-08-11 ENCOUNTER — Ambulatory Visit: Payer: Medicare Other | Admitting: Physical Therapy

## 2021-08-11 DIAGNOSIS — M6281 Muscle weakness (generalized): Secondary | ICD-10-CM

## 2021-08-11 DIAGNOSIS — R2681 Unsteadiness on feet: Secondary | ICD-10-CM | POA: Diagnosis not present

## 2021-08-11 DIAGNOSIS — I639 Cerebral infarction, unspecified: Secondary | ICD-10-CM | POA: Diagnosis not present

## 2021-08-11 DIAGNOSIS — R262 Difficulty in walking, not elsewhere classified: Secondary | ICD-10-CM

## 2021-08-11 DIAGNOSIS — R278 Other lack of coordination: Secondary | ICD-10-CM | POA: Diagnosis not present

## 2021-08-11 NOTE — Therapy (Signed)
OUTPATIENT PHYSICAL THERAPY NEURO TREATMENT   Patient Name: Monica Hinton MRN: [id number] DOB:January 16, 1946, 76 y.o., female Today's Date: 08/11/2021   PCP: Orpah Melter, MD REFERRING PROVIDER: Elodia Florence., MD    PT End of Session - 08/11/21 0932     Visit Number 2    Number of Visits 21    Date for PT Re-Evaluation 10/18/21    Authorization Type UHC medicare    Progress Note Due on Visit 10    PT Start Time 0930    PT Stop Time 1010    PT Time Calculation (min) 40 min    Equipment Utilized During Treatment Gait belt    Activity Tolerance Patient tolerated treatment well    Behavior During Therapy [facility] for tasks assessed/performed;Anxious              Past Medical History:  Diagnosis Date   Allergic rhinitis    Asthma    seasonal    Bursitis    r hip    GERD (gastroesophageal reflux disease)    High cholesterol    Hypercholesteremia    Hypertension    Hypothyroidism    Hypothyroidism    Insomnia    Leukocytosis    Obesity    Peripheral vascular disease (HCC)    bilateral LE edema ; edma in hands as well    PONV (postoperative nausea and vomiting)    Thrombocytosis    Vitamin D deficiency    Past Surgical History:  Procedure Laterality Date   bladder      bladder tack  20 years ago    COLONOSCOPY     EYE SURGERY Bilateral    catracts   hysterectomy      20 years    OPEN SURGICAL REPAIR OF GLUTEAL TENDON Right 09/19/2016   Procedure: Right hip bursectomy and gluteal tendon repair;  Surgeon: Gaynelle Arabian, MD;  Location: [facility];  Service: Orthopedics;  Laterality: Right;   TONSILLECTOMY     age 64    TOTAL ANKLE ARTHROPLASTY Right 04/27/2021   Procedure: TOTAL ANKLE ARTHROPLASTY;  Surgeon: Wylene Simmer, MD;  Location: [place];  Service: Orthopedics;  Laterality: Right;   Patient Active Problem List   Diagnosis Date Noted   Hormone replacement therapy 07/25/2021   Essential hypertension 07/25/2021   Dyslipidemia  07/25/2021   Asthma, chronic 07/25/2021   Acute CVA (cerebrovascular accident) ([place]) 07/24/2021   Spondylolisthesis of lumbar region 09/10/2019   Tendinopathy of right gluteus medius 09/19/2016    ONSET DATE: 07/23/21  REFERRING DIAG: 63.9 (ICD-10-CM) - Acute CVA (cerebrovascular accident) ([place])   THERAPY DIAG:  Muscle weakness (generalized)  Unsteadiness on feet  Difficulty in walking, not elsewhere classified  Rationale for Evaluation and Treatment Rehabilitation  SUBJECTIVE:  SUBJECTIVE STATEMENT: Patient reports she has been able to walk up/down her ramp at home rather than having her husband push up her on the rollator. No other changes since last visit.   Pt accompanied by: significant other [name]  PERTINENT HISTORY: HTN,HLD,hypothyroidism,asthma PVD chronic b/l lower extremity edema, recent ankle replacement ~04/2021  PAIN:  Are you having pain? No  PRECAUTIONS: Fall  WEIGHT BEARING RESTRICTIONS No  FALLS: Has patient fallen in last 6 months? No   PATIENT GOALS "To get as close to 100% as possible"   OBJECTIVE:    TODAY'S TREATMENT:  THER EX: Initiated HEP, see below  THER ACT:  [facility] PT Assessment - 08/11/21 0934       Standardized Balance Assessment   Standardized Balance Assessment Berg Balance Test      Berg Balance Test   Sit to Stand Able to stand without using hands and stabilize independently    Standing Unsupported Able to stand safely 2 minutes    Sitting with Back Unsupported but Feet Supported on Floor or Stool Able to sit safely and securely 2 minutes    Stand to Sit Sits safely with minimal use of hands    Transfers Able to transfer with verbal cueing and /or supervision    Standing Unsupported with Eyes Closed Able to stand 10 seconds with supervision     Standing Unsupported with Feet Together Able to place feet together independently and stand for 1 minute with supervision    From Standing, Reach Forward with Outstretched Arm Reaches forward but needs supervision    From Standing Position, Pick up Object from Floor Unable to try/needs assist to keep balance    From Standing Position, Turn to Look Behind Over each Shoulder Needs supervision when turning    Turn 360 Degrees Needs close supervision or verbal cueing    Standing Unsupported, Alternately Place Feet on Step/Stool Able to complete >2 steps/needs minimal assist    Standing Unsupported, One Foot in Front Needs help to step but can hold 15 seconds    Standing on One Leg Tries to lift leg/unable to hold 3 seconds but remains standing independently    Total Score 30    Berg comment: 30/56, high fall risk              GAIT: Gait pattern: step to pattern, decreased step length- Left, decreased stance time- Right, decreased stride length, decreased ankle dorsiflexion- Right, and antalgic Distance walked: various clinic distances Assistive device utilized: Walker - 4 wheeled Level of assistance: Modified independence Comments: Pt reports no pain with gait but continues to exhibit antalgic gait pattern with decreased tolerance for stance on RLE. Reviewed "heel/toe" gait with patient.   PATIENT EDUCATION: Education details: results and functional implications of BBS score, initiated HEP, "heel/toe" gait on RLE Person educated: Patient and Spouse Education method: Explanation, Demonstration, and Handouts Education comprehension: verbalized understanding   HOME EXERCISE PROGRAM: Access Code: [email or web address] URL: [email or web address] Date: 08/11/2021 Prepared by: Excell Seltzer  Exercises - Heel Raises with Counter Support  - 1 x daily - 7 x weekly - 3 sets - 10 reps - Long Sitting Ankle Plantar Flexion with Resistance  - 1 x daily - 7 x weekly - 3 sets - 10 reps - Long  Sitting Ankle Dorsiflexion with Anchored Resistance  - 1 x daily - 7 x weekly - 3 sets - 10 reps - Alternating Step Taps with Counter Support  - 1 x daily - 7 x weekly - 3  sets - 10 reps    GOALS: Goals reviewed with patient? Yes  SHORT TERM GOALS: Target date: 09/13/2021  Pt will be independent with initial HEP for improved balance and gait mechanics  Baseline: to be provided Goal status: INITIAL  2.  Pt will improve FGA to >/= 12/30 to demonstrate improved balance and reduced fall risk  Baseline: 7/30 Goal status: INITIAL  3.  Pt will improve gait speed to >/= .23ms to demonstrate improved community ambulation  Baseline: .232m with rollator  Goal status: INITIAL   LONG TERM GOALS: Target date: 10/18/2021  Pt will be independent with final HEP for improved balance and gait mechanics   Baseline: to be provided Goal status: INITIAL  2.  Pt will improve FGA to >/= 17/30 to demonstrate improved balance and reduced fall risk  Baseline: 7/30 Goal status: INITIAL  3.  Pt will improve gait speed to >/= .4556mto demonstrate improved community ambulation  Baseline: .42m13mith rollator  Goal status: INITIAL  ASSESSMENT:  CLINICAL IMPRESSION: Emphasis of skilled PT session on assessing balance with BBS, establishing HEP for ankle strengthening, balance/coordination and to encourage increased stance time on RLE, and educating pt on correct gait mechanics. Pt scores 30/56 on BBS, indicating a high fall risk. Discussed score and functional implications with pt and her husband, verbalize understanding. Initiated HEP, see above, and provided handout. Reviewed importance of heel strike with RLE during gait as well as encouraged increased stance time on RLE. Continue POC.   OBJECTIVE IMPAIRMENTS Abnormal gait, decreased activity tolerance, decreased balance, decreased endurance, decreased knowledge of condition, decreased knowledge of use of DME, difficulty walking, and decreased  strength.   ACTIVITY LIMITATIONS carrying, lifting, bending, standing, squatting, stairs, and locomotion level  PARTICIPATION LIMITATIONS: meal prep, cleaning, laundry, driving, shopping, and community activity  PERSONAL FACTORS Age, Fitness, Past/current experiences, and 3+ comorbidities: HTN HLD, PVD, LE edema, recent R ankle replacement   are also affecting patient's functional outcome.   REHAB POTENTIAL: Good  CLINICAL DECISION MAKING: Stable/uncomplicated  EVALUATION COMPLEXITY: Low  PLAN: PT FREQUENCY: 2x/week  PT DURATION: 10 weeks  PLANNED INTERVENTIONS: Therapeutic exercises, Therapeutic activity, Neuromuscular re-education, Balance training, Gait training, Patient/Family education, Self Care, Joint mobilization, Joint manipulation, Stair training, Vestibular training, Visual/preceptual remediation/compensation, Orthotic/Fit training, DME instructions, Aquatic Therapy, Dry Needling, Cognitive remediation, Electrical stimulation, Spinal manipulation, Spinal mobilization, Cryotherapy, Moist heat, Manual lymph drainage, scar mobilization, Taping, Manual therapy, and Re-evaluation  PLAN FOR NEXT SESSION: continue to work on balance, gait mechanics, R ankle strengthening, eccentric step-downs, increasing stance time on RLE   TaylExcell Seltzer, DPT, CSRS 08/11/2021, 10:13 AM

## 2021-08-15 ENCOUNTER — Ambulatory Visit: Payer: Medicare Other | Attending: Family Medicine | Admitting: Occupational Therapy

## 2021-08-15 ENCOUNTER — Ambulatory Visit: Payer: Medicare Other

## 2021-08-15 DIAGNOSIS — M6281 Muscle weakness (generalized): Secondary | ICD-10-CM

## 2021-08-15 DIAGNOSIS — R278 Other lack of coordination: Secondary | ICD-10-CM | POA: Insufficient documentation

## 2021-08-15 DIAGNOSIS — R262 Difficulty in walking, not elsewhere classified: Secondary | ICD-10-CM | POA: Diagnosis not present

## 2021-08-15 DIAGNOSIS — R2681 Unsteadiness on feet: Secondary | ICD-10-CM | POA: Diagnosis not present

## 2021-08-15 NOTE — Therapy (Signed)
OUTPATIENT PHYSICAL THERAPY NEURO TREATMENT   Patient Name: Monica Hinton MRN: [id number] DOB:July 17, 1945, 76 y.o., female Today's Date: 08/15/2021   PCP: Orpah Melter, MD REFERRING PROVIDER: Elodia Florence., MD    PT End of Session - 08/15/21 [id number]     Visit Number 3    Number of Visits 21    Date for PT Re-Evaluation 10/18/21    Authorization Type UHC medicare    Progress Note Due on Visit 10    PT Start Time 0931    PT Stop Time 1013    PT Time Calculation (min) 42 min    Equipment Utilized During Treatment Gait belt    Activity Tolerance Patient tolerated treatment well    Behavior During Therapy [facility] for tasks assessed/performed;Anxious              Past Medical History:  Diagnosis Date   Allergic rhinitis    Asthma    seasonal    Bursitis    r hip    GERD (gastroesophageal reflux disease)    High cholesterol    Hypercholesteremia    Hypertension    Hypothyroidism    Hypothyroidism    Insomnia    Leukocytosis    Obesity    Peripheral vascular disease (HCC)    bilateral LE edema ; edma in hands as well    PONV (postoperative nausea and vomiting)    Thrombocytosis    Vitamin D deficiency    Past Surgical History:  Procedure Laterality Date   bladder      bladder tack  20 years ago    COLONOSCOPY     EYE SURGERY Bilateral    catracts   hysterectomy      20 years    OPEN SURGICAL REPAIR OF GLUTEAL TENDON Right 09/19/2016   Procedure: Right hip bursectomy and gluteal tendon repair;  Surgeon: Gaynelle Arabian, MD;  Location: [facility];  Service: Orthopedics;  Laterality: Right;   TONSILLECTOMY     age 68    TOTAL ANKLE ARTHROPLASTY Right 04/27/2021   Procedure: TOTAL ANKLE ARTHROPLASTY;  Surgeon: Wylene Simmer, MD;  Location: [place];  Service: Orthopedics;  Laterality: Right;   Patient Active Problem List   Diagnosis Date Noted   Hormone replacement therapy 07/25/2021   Essential hypertension 07/25/2021   Dyslipidemia  07/25/2021   Asthma, chronic 07/25/2021   Acute CVA (cerebrovascular accident) ([place]) 07/24/2021   Spondylolisthesis of lumbar region 09/10/2019   Tendinopathy of right gluteus medius 09/19/2016    ONSET DATE: 07/23/21  REFERRING DIAG: 63.9 (ICD-10-CM) - Acute CVA (cerebrovascular accident) ([place])   THERAPY DIAG:  Muscle weakness (generalized)  Unsteadiness on feet  Difficulty in walking, not elsewhere classified  Other lack of coordination  Rationale for Evaluation and Treatment Rehabilitation  SUBJECTIVE:  SUBJECTIVE STATEMENT: Patient reports doing well. States she is doing exercises at home, but has difficulty with heel raises.    Pt accompanied by: significant other [name]  PERTINENT HISTORY: HTN,HLD,hypothyroidism,asthma PVD chronic b/l lower extremity edema, recent ankle replacement ~04/2021  PAIN:  Are you having pain? No  PRECAUTIONS: Fall  WEIGHT BEARING RESTRICTIONS No   TODAY'S TREATMENT:  Therex: -Scifit x10 mins level 3 -heel/toe walking in // bars -eccentric lowering L LE standing on R LE (initially with 4" box, regressed to 2" box) -heel raises B UE support (unable to do toes-out due to L ankle pain) -HEP additions (see below)  -HHA ambulate x86f    PATIENT EDUCATION: Education details: HEP, PT prognosis  Person educated: Patient and Spouse Education method: Explanation, Demonstration, and Handouts Education comprehension: verbalized understanding   HOME EXERCISE PROGRAM: Access Code: [email or web address] URL: [email or web address] Date: 08/11/2021 Prepared by: TExcell Seltzer Exercises - Heel Raises with Counter Support  - 1 x daily - 7 x weekly - 3 sets - 10 reps - Long Sitting Ankle Plantar Flexion with Resistance  - 1 x daily - 7 x weekly - 3 sets - 10  reps - Long Sitting Ankle Dorsiflexion with Anchored Resistance  - 1 x daily - 7 x weekly - 3 sets - 10 reps - Alternating Step Taps with Counter Support  - 1 x daily - 7 x weekly - 3 sets - 10 reps - Seated Ankle Alphabet  - 1 x daily - 7 x weekly - 3 sets - 10 reps - Ankle Circles on Wobble Board in Sitting  - 1 x daily - 7 x weekly - 3 sets - 10 reps    GOALS: Goals reviewed with patient? Yes  SHORT TERM GOALS: Target date: 09/13/2021  Pt will be independent with initial HEP for improved balance and gait mechanics  Baseline: to be provided Goal status: INITIAL  2.  Pt will improve FGA to >/= 12/30 to demonstrate improved balance and reduced fall risk  Baseline: 7/30 Goal status: INITIAL  3.  Pt will improve gait speed to >/= .359m to demonstrate improved community ambulation  Baseline: .2950mwith rollator  Goal status: INITIAL   LONG TERM GOALS: Target date: 10/18/2021  Pt will be independent with final HEP for improved balance and gait mechanics   Baseline: to be provided Goal status: INITIAL  2.  Pt will improve FGA to >/= 17/30 to demonstrate improved balance and reduced fall risk  Baseline: 7/30 Goal status: INITIAL  3.  Pt will improve gait speed to >/= .44m43mo demonstrate improved community ambulation  Baseline: .108m/89mth rollator  Goal status: INITIAL  ASSESSMENT:  CLINICAL IMPRESSION: Patient seen for skilled PT session with emphasis on R LE NMR and functional strengthening. Patient tolerating progression to HEP exercises well. Noting improved AROM to R ankle during eccentric lowering. Does still demonstrate poor trust in R LE grossly resulting in severely antalgic (?) and trendelenburg gait pattern. Patient immediately resorting back to step-to gait pattern once bout of exercise is completed demonstrating poor carryover of reciprocal stepping task and heel first contact. Continue POC.    OBJECTIVE IMPAIRMENTS Abnormal gait, decreased activity  tolerance, decreased balance, decreased endurance, decreased knowledge of condition, decreased knowledge of use of DME, difficulty walking, and decreased strength.   ACTIVITY LIMITATIONS carrying, lifting, bending, standing, squatting, stairs, and locomotion level  PARTICIPATION LIMITATIONS: meal prep, cleaning, laundry, driving, shopping, and community activity  PERSONAL FACTORS Age, Fitness, Past/current experiences, and 3+  comorbidities: HTN HLD, PVD, LE edema, recent R ankle replacement   are also affecting patient's functional outcome.   REHAB POTENTIAL: Good  CLINICAL DECISION MAKING: Stable/uncomplicated  EVALUATION COMPLEXITY: Low  PLAN: PT FREQUENCY: 2x/week  PT DURATION: 10 weeks  PLANNED INTERVENTIONS: Therapeutic exercises, Therapeutic activity, Neuromuscular re-education, Balance training, Gait training, Patient/Family education, Self Care, Joint mobilization, Joint manipulation, Stair training, Vestibular training, Visual/preceptual remediation/compensation, Orthotic/Fit training, DME instructions, Aquatic Therapy, Dry Needling, Cognitive remediation, Electrical stimulation, Spinal manipulation, Spinal mobilization, Cryotherapy, Moist heat, Manual lymph drainage, scar mobilization, Taping, Manual therapy, and Re-evaluation  PLAN FOR NEXT SESSION: continue to work on balance, gait mechanics, R ankle strengthening, eccentric step-downs, increasing stance time on RLE, R hip strengthening   Debbora Dus, PT, DPT Debbora Dus, PT, DPT, CBIS  08/15/2021, 10:18 AM

## 2021-08-15 NOTE — Therapy (Signed)
OUTPATIENT OCCUPATIONAL THERAPY NEURO TREATMENT  Patient Name: Monica Hinton MRN: [id number] DOB:09/20/1945, 76 y.o., female Today's Date: 08/15/2021  PCP: Orpah Melter, MD  REFERRING PROVIDER: PCP: Orpah Melter, MD    OT End of Session - 08/15/21 1017     Visit Number 2    Number of Visits 8    Date for OT Re-Evaluation 09/09/21    Authorization Type UHC MCR    OT Start Time 1015    OT Stop Time 1100    OT Time Calculation (min) 45 min    Activity Tolerance Patient tolerated treatment well    Behavior During Therapy [facility] for tasks assessed/performed             Past Medical History:  Diagnosis Date   Allergic rhinitis    Asthma    seasonal    Bursitis    r hip    GERD (gastroesophageal reflux disease)    High cholesterol    Hypercholesteremia    Hypertension    Hypothyroidism    Hypothyroidism    Insomnia    Leukocytosis    Obesity    Peripheral vascular disease ([place])    bilateral LE edema ; edma in hands as well    PONV (postoperative nausea and vomiting)    Thrombocytosis    Vitamin D deficiency    Past Surgical History:  Procedure Laterality Date   bladder      bladder tack  20 years ago    COLONOSCOPY     EYE SURGERY Bilateral    catracts   hysterectomy      20 years    OPEN SURGICAL REPAIR OF GLUTEAL TENDON Right 09/19/2016   Procedure: Right hip bursectomy and gluteal tendon repair;  Surgeon: Gaynelle Arabian, MD;  [facility];  Service: Orthopedics;  Laterality: Right;   TONSILLECTOMY     age 43    TOTAL ANKLE ARTHROPLASTY Right 04/27/2021   Procedure: TOTAL ANKLE ARTHROPLASTY;  Surgeon: Wylene Simmer, MD;  Location: [place];  Service: Orthopedics;  Laterality: Right;   Patient Active Problem List   Diagnosis Date Noted   Hormone replacement therapy 07/25/2021   Essential hypertension 07/25/2021   Dyslipidemia 07/25/2021   Asthma, chronic 07/25/2021   Acute CVA (cerebrovascular accident) ([place]) 07/24/2021    Spondylolisthesis of lumbar region 09/10/2019   Tendinopathy of right gluteus medius 09/19/2016    ONSET DATE: 07/23/2021   REFERRING DIAG: I63.9 (ICD-10-CM) - Acute CVA (cerebrovascular accident) ([place])   MRI with 12 mm acute ischemic nonhemorrhagic posterior L basal ganglia infarct   THERAPY DIAG:  Muscle weakness (generalized)  Other lack of coordination  Unsteadiness on feet  Rationale for Evaluation and Treatment Rehabilitation  SUBJECTIVE:   SUBJECTIVE STATEMENT: I can't do that (re: laying down prone and wall push ups)  Pt accompanied by: significant other  PERTINENT HISTORY: medical history significant of  HTN,HLD,hypothyroidism,asthma PVD chronic b/l lower extremity edema, L4-5 fusion 2021, Rt ankle replacement 04/27/21  PRECAUTIONS: Fall  WEIGHT BEARING RESTRICTIONS No  PAIN:  Are you having pain? No  PLOF: Independent, retired, has not driven past year due to ankle, ballroom dancing  PATIENT GOALS get back to 100%  OBJECTIVE:   HAND DOMINANCE: Right  TODAY'S TREATMENT Pt issued putty and coordination HEP for Rt hand. Pt issued green resistance putty. Noted apraxia Rt hand for high level coordination ex's.   Began to provide shoulder HEP for ROM however noted Rt shoulder hiking and activation of scapula elevation  for initial sh flexion instead of scapula depression. Pt also w/ Rt scapula slightly elevated at rest. Modified ex's to promote scapula depression and posterior sh girdle strengthening (see below), however did not have time to issue these ex's as HEP. Pt refused to get into prone position d/t low back pain. Pt also refused to try wall push ups d/t reports of limited ankle ROM and concerned w/ balance (however P.T. later reported that pt has sufficient ankle ROM to do this and can do w/ close supervision)  Chair push ups for scapula depression w/ cues to perform correctly Modified shoulder extension ex's leaning of counter for posterior sh girdle  strengthening w/ cues to prevent compensations Standing w/ back against wall for scapula retraction and depression w/ cues to perform correctly Supine: BUE sh flexion w/ palms facing (using foam roll) and verbal and tactile cues needed to prevent scapula elevation and sh hiking w/ first 90* of sh flexion. Pt did better when visualizing scapula moving first towards bottom, then out to side.   PATIENT EDUCATION: Education details: coordination and strengthening HEP for Rt hand (began shoulder HEP but did not issue this today)  Person educated: Patient and Spouse Education method: Explanation, Demonstration, Verbal cues, and Handouts Education comprehension: verbalized understanding, returned demonstration, verbal cues required, and needs further education   HOME EXERCISE PROGRAM: 08/15/21: coordination and putty HEP for Rt hand    GOALS: Goals reviewed with patient? Yes  LONG TERM GOALS: Target date: 09/09/21  Independent w/ HEP for Rt shoulder ROM/strengthening, grip strengthening, and high level coordination HEP  Baseline:  Goal status: IN PROGRESS  2.  Pt to simulate cooking task and laundry tasks safely using rollator Baseline:  Goal status: INITIAL  3.  Pt to demo full Rt shoulder flexion for reaching into higher cabinets Baseline:  Goal status: INITIAL  4.  Improve Rt hand coordination as evidenced by performing 9 hole peg test in 24 sec or less Baseline: 28.72 sec Goal status: INITIAL  ASSESSMENT:  CLINICAL IMPRESSION: Patient w/ noted apraxia during O.T. session today. 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 Stroke. However, these deficits were only on Rt side consistent w/ stroke deficits. Pt w/ elevated scapula Rt side and goes into more elevation vs. Depression for initial sh flexion.   PERFORMANCE DEFICITS in functional skills including IADLs, coordination, dexterity, ROM, strength, mobility, balance, body mechanics, decreased knowledge of use of DME, and UE functional  use,.   IMPAIRMENTS are limiting patient from IADLs, leisure, and social participation.   COMORBIDITIES may have co-morbidities  that affects occupational performance. Patient will benefit from skilled OT to address above impairments and improve overall function.  MODIFICATION OR ASSISTANCE TO COMPLETE EVALUATION: No modification of tasks or assist necessary to complete an evaluation.  OT OCCUPATIONAL PROFILE AND HISTORY: Problem focused assessment: Including review of records relating to presenting problem.  CLINICAL DECISION MAKING: Moderate - several treatment options, min-mod task modification necessary  REHAB POTENTIAL: Good  EVALUATION COMPLEXITY: Low    PLAN: OT FREQUENCY: 2x/week  OT DURATION: 4 weeks (Anticipate only 4 sessions needed)   PLANNED INTERVENTIONS: self care/ADL training, therapeutic exercise, therapeutic activity, neuromuscular re-education, functional mobility training, patient/family education, coping strategies training, and DME and/or AE instructions  RECOMMENDED OTHER SERVICES: none  CONSULTED AND AGREED WITH PLAN OF CARE: Patient and family member/caregiver  PLAN FOR NEXT SESSION: issue and review shoulder/scapula HEP, consider taping to relax upper traps and activate scapula depression. If time (or following session) practice simulated  cooking tasks for safety and proper rollator negotiation   Hans Eden, OT 08/15/2021, 10:18 AM

## 2021-08-15 NOTE — Patient Instructions (Addendum)
  1. Grip Strengthening (Resistive Putty)   Squeeze putty using thumb and all fingers. Repeat _20___ times. Do __2__ sessions per day.   2. Roll putty into tube on table and pinch between first two fingers and thumb x 10 reps. Do 2 sessions per day.     Coordination Activities  Perform the following activities for 10-15 minutes 1-2 times per day with right hand(s).  Rotate ball in fingertips (clockwise and counter-clockwise 3 full revolutions each way). Deal cards with your thumb (Hold deck in hand and push card off top with thumb). Rotate one card in hand (clockwise and counter-clockwise). Pick up coins one at a time until you get 5 in your hand, then move coins from palm to fingertips to stack one at a time. Do 3 stacks of 5        Elbow Extension: Chair Stand - Resisted    Slide forward in seat, with hands on armrests, push up from chair. As you come up, shoulder blades should go down towards bottom. Return slowly . Repeat _10___ times per set. Do _2-3___ sets per day.    Extension - Prone (Dumbbell)    STANDING OVER COUNTER:  Lift hand back and up, keep thumb facing front/down, NOT towards body, however keep arm close to body. Repeat __10__ times per set. Do _2___ sessions per day.    SHOULDER: Flexion Bilateral    Laying down on back: Raise arms overhead at same speed. Keep elbows straight. Hold paper towel roll in hands. Shoulder blades should first go down towards bottom as arms come up, then go out to side when arms go overhead __10_ reps per set, _2__ sets per day  Standing w/ back against wall: have shoulder blades come slightly together and down while reaching fingers towards floor. This is a very subtle movement only. Come back to a relaxed position. Repeat slowly 10 reps, 2 sessions per day

## 2021-08-16 ENCOUNTER — Emergency Department (HOSPITAL_BASED_OUTPATIENT_CLINIC_OR_DEPARTMENT_OTHER)
Admission: EM | Admit: 2021-08-16 | Discharge: 2021-08-16 | Disposition: A | Discharge status: Home / Self Care | Payer: Medicare Other | Source: Home / Self Care | Attending: Emergency Medicine | Admitting: Emergency Medicine

## 2021-08-16 ENCOUNTER — Other Ambulatory Visit: Payer: Self-pay

## 2021-08-16 ENCOUNTER — Encounter (HOSPITAL_BASED_OUTPATIENT_CLINIC_OR_DEPARTMENT_OTHER): Payer: Self-pay

## 2021-08-16 ENCOUNTER — Observation Stay (HOSPITAL_COMMUNITY)
Admission: EM | Admit: 2021-08-16 | Discharge: 2021-08-18 | Disposition: A | Discharge status: Home / Self Care | Payer: Medicare Other | Attending: Internal Medicine | Admitting: Internal Medicine

## 2021-08-16 ENCOUNTER — Encounter (HOSPITAL_COMMUNITY): Payer: Self-pay

## 2021-08-16 ENCOUNTER — Other Ambulatory Visit (HOSPITAL_BASED_OUTPATIENT_CLINIC_OR_DEPARTMENT_OTHER): Payer: Self-pay

## 2021-08-16 DIAGNOSIS — Z87891 Personal history of nicotine dependence: Secondary | ICD-10-CM | POA: Diagnosis not present

## 2021-08-16 DIAGNOSIS — K5900 Constipation, unspecified: Secondary | ICD-10-CM | POA: Insufficient documentation

## 2021-08-16 DIAGNOSIS — D72829 Elevated white blood cell count, unspecified: Secondary | ICD-10-CM

## 2021-08-16 DIAGNOSIS — E785 Hyperlipidemia, unspecified: Secondary | ICD-10-CM | POA: Diagnosis present

## 2021-08-16 DIAGNOSIS — I10 Essential (primary) hypertension: Secondary | ICD-10-CM | POA: Insufficient documentation

## 2021-08-16 DIAGNOSIS — Z79891 Long term (current) use of opiate analgesic: Secondary | ICD-10-CM | POA: Insufficient documentation

## 2021-08-16 DIAGNOSIS — K644 Residual hemorrhoidal skin tags: Secondary | ICD-10-CM | POA: Insufficient documentation

## 2021-08-16 DIAGNOSIS — R079 Chest pain, unspecified: Secondary | ICD-10-CM | POA: Diagnosis not present

## 2021-08-16 DIAGNOSIS — Z79899 Other long term (current) drug therapy: Secondary | ICD-10-CM | POA: Insufficient documentation

## 2021-08-16 DIAGNOSIS — E039 Hypothyroidism, unspecified: Secondary | ICD-10-CM | POA: Insufficient documentation

## 2021-08-16 DIAGNOSIS — K649 Unspecified hemorrhoids: Secondary | ICD-10-CM | POA: Diagnosis not present

## 2021-08-16 DIAGNOSIS — R61 Generalized hyperhidrosis: Secondary | ICD-10-CM | POA: Diagnosis not present

## 2021-08-16 DIAGNOSIS — R55 Syncope and collapse: Principal | ICD-10-CM | POA: Diagnosis present

## 2021-08-16 DIAGNOSIS — Z743 Need for continuous supervision: Secondary | ICD-10-CM | POA: Diagnosis not present

## 2021-08-16 DIAGNOSIS — Z96651 Presence of right artificial knee joint: Secondary | ICD-10-CM | POA: Insufficient documentation

## 2021-08-16 DIAGNOSIS — J45909 Unspecified asthma, uncomplicated: Secondary | ICD-10-CM | POA: Insufficient documentation

## 2021-08-16 DIAGNOSIS — R404 Transient alteration of awareness: Secondary | ICD-10-CM | POA: Diagnosis not present

## 2021-08-16 DIAGNOSIS — Z8673 Personal history of transient ischemic attack (TIA), and cerebral infarction without residual deficits: Secondary | ICD-10-CM

## 2021-08-16 DIAGNOSIS — Z7982 Long term (current) use of aspirin: Secondary | ICD-10-CM | POA: Insufficient documentation

## 2021-08-16 LAB — BASIC METABOLIC PANEL
Anion gap: 10 (ref 5–15)
BUN: 9 mg/dL (ref 8–23)
CO2: 23 mmol/L (ref 22–32)
Calcium: 9.3 mg/dL (ref 8.9–10.3)
Chloride: 103 mmol/L (ref 98–111)
Creatinine, Ser: 0.68 mg/dL (ref 0.44–1.00)
GFR, Estimated: >60 mL/min (ref >60)
Glucose, Bld: 122 mg/dL — ABNORMAL HIGH (ref 70–99)
Potassium: 3.8 mmol/L (ref 3.5–5.1)
Sodium: 136 mmol/L (ref 135–145)

## 2021-08-16 LAB — CBC WITH DIFFERENTIAL/PLATELET
Abs Immature Granulocytes: 0.06 10*3/uL (ref 0.00–0.07)
Basophils Absolute: 0.1 10*3/uL (ref 0.0–0.1)
Basophils Relative: 1 %
Eosinophils Absolute: 0.3 10*3/uL (ref 0.0–0.5)
Eosinophils Relative: 2 %
HCT: 39.7 % (ref 36.0–46.0)
Hemoglobin: 13.1 g/dL (ref 12.0–15.0)
Immature Granulocytes: 0 %
Lymphocytes Relative: 20 %
Lymphs Abs: 2.8 10*3/uL (ref 0.7–4.0)
MCH: 30.1 pg (ref 26.0–34.0)
MCHC: 33 g/dL (ref 30.0–36.0)
MCV: 91.3 fL (ref 80.0–100.0)
Monocytes Absolute: 0.8 10*3/uL (ref 0.1–1.0)
Monocytes Relative: 6 %
Neutro Abs: 10.2 10*3/uL — ABNORMAL HIGH (ref 1.7–7.7)
Neutrophils Relative %: 71 %
Platelets: 366 10*3/uL (ref 150–400)
RBC: 4.35 MIL/uL (ref 3.87–5.11)
RDW: 13 % (ref 11.5–15.5)
WBC: 14.2 10*3/uL — ABNORMAL HIGH (ref 4.0–10.5)
nRBC: 0 % (ref 0.0–0.2)

## 2021-08-16 LAB — CBG MONITORING, ED: Glucose-Capillary: 124 mg/dL — ABNORMAL HIGH (ref 70–99)

## 2021-08-16 MED ORDER — POLYETHYLENE GLYCOL 3350 17 GM/SCOOP PO POWD
1.0000 | Freq: Once | ORAL | 0 refills | Status: DC
  Filled 2021-08-16: qty 238, 1d supply, fill #0

## 2021-08-16 MED ORDER — GLYCERIN (ADULT) 2 G RE SUPP
1.0000 | RECTAL | 0 refills | Status: DC | PRN
  Filled 2021-08-16: qty 12, 12d supply, fill #0

## 2021-08-16 MED ORDER — FLEET ENEMA 7-19 GM/118ML RE ENEM
1.0000 | ENEMA | Freq: Once | RECTAL | Status: AC
  Administered 2021-08-16: 1 via RECTAL
  Filled 2021-08-16: qty 1

## 2021-08-16 MED ORDER — GLYCERIN (LAXATIVE) 2.1 G RE SUPP
1.0000 | Freq: Once | RECTAL | Status: AC
  Administered 2021-08-16: 1 via RECTAL
  Filled 2021-08-16: qty 1

## 2021-08-16 NOTE — Discharge Instructions (Addendum)
Recommend using 4 capfuls of MiraLAX in a 32 ounce Gatorade, drink over 4 hours.  Can repeat the following day if no satisfactory bowel movement.  Continue to push oral fluids as well.  Glycerin suppositories have also been prescribed.

## 2021-08-16 NOTE — ED Triage Notes (Signed)
Pt c/o consitpation x 5-6 days. Tried enema, however liquid came right out. States had hemorrhoids.   Recently had a stroke 3 weeks ago, weakness to right side.

## 2021-08-16 NOTE — ED Notes (Addendum)
Pt with successful BM after enema.

## 2021-08-16 NOTE — ED Provider Notes (Signed)
[facility] EMERGENCY DEPARTMENT Provider Note   CSN: [id number] Arrival date & time: 08/16/21  4401     History  Chief Complaint  Patient presents with   Constipation    Monica Hinton is a 76 y.o. female.  HPI  76 year old female with medical history significant for HTN, PVD, asthma, thyroidism, GERD, HLD, obesity, hemorrhoids who presents to the emergency department with a chief complaint of constipation.  The patient denies any abdominal pain or discomfort.  She states that she normally has a bowel movement daily.  She has not had a bowel movement for the last 5 to 6 days.  She tried an enema at home without success.  She has been trying stool softeners.  She denies any rectal pain.  She denies any fevers or chills.  She denies any nausea or vomiting.  She is passing gas.  Home Medications Prior to Admission medications   Medication Sig Start Date End Date Taking? Authorizing Provider  glycerin adult 2 g suppository Place 1 suppository rectally as needed for constipation. 08/16/21  Yes Regan Lemming, MD  polyethylene glycol powder (GLYCOLAX/MIRALAX) 17 GM/SCOOP powder Take 238 g by mouth once for 1 dose. 08/16/21 08/17/21 Yes Regan Lemming, MD  acetaminophen (TYLENOL) 500 MG tablet Take 1,000 mg by mouth 3 (three) times daily.    [provider]  aspirin 81 MG chewable tablet Chew 1 tablet (81 mg total) by mouth daily. 07/26/21 07/21/22  Elodia Florence., MD  gabapentin (NEURONTIN) 300 MG capsule Take 600 mg by mouth 3 (three) times daily. 06/08/19   [provider]  levothyroxine (SYNTHROID) 112 MCG tablet Take 112 mcg by mouth daily before breakfast. 06/29/19   [provider]  losartan (COZAAR) 100 MG tablet Take 100 mg by mouth daily. 07/10/21   [provider]  montelukast (SINGULAIR) 10 MG tablet Take 1 tablet by mouth daily.    [provider]  RABEprazole (ACIPHEX) 20 MG tablet Take 20 mg by mouth daily before breakfast.      [provider]  rosuvastatin (CRESTOR) 10 MG tablet Take 10 mg by mouth daily.     [provider]  Vitamin D, Ergocalciferol, (DRISDOL) 1.25 MG (50000 UNIT) CAPS capsule Take 50,000 Units by mouth every Monday. 07/31/19   [provider]      Allergies    Yellow jacket venom [bee venom], Codeine, Milk (cow), Other, Penicillins, Procaine, Simvastatin, and Chlorhexidine    Review of Systems   Review of Systems  Gastrointestinal:  Positive for constipation.  All other systems reviewed and are negative.   Physical Exam Updated Vital Signs BP (!) 155/74 (BP Location: Right Arm)   Pulse 74   Temp 98.1 F (36.7 C) (Oral)   Resp 18   Ht '5\' 3"'$  (1.6 m)   Wt 77.1 kg   SpO2 97%   BMI 30.11 kg/m  Physical Exam Vitals and nursing note reviewed. Exam conducted with a chaperone present.  Constitutional:      General: She is not in acute distress.    Appearance: She is well-developed.  HENT:     Head: Normocephalic and atraumatic.  Eyes:     Conjunctiva/sclera: Conjunctivae normal.  Cardiovascular:     Rate and Rhythm: Normal rate and regular rhythm.     Heart sounds: No murmur heard. Pulmonary:     Effort: Pulmonary effort is normal. No respiratory distress.     Breath sounds: Normal breath sounds.  Abdominal:  Palpations: Abdomen is soft.     Tenderness: There is no abdominal tenderness. There is no guarding or rebound.  Genitourinary:    Comments: Rectal exam with soft stool in the rectal vault, no clear impaction, external hemorrhoids present nonthrombosed Musculoskeletal:        General: No swelling.     Cervical back: Neck supple.  Skin:    General: Skin is warm and dry.     Capillary Refill: Capillary refill takes less than 2 seconds.  Neurological:     Mental Status: She is alert.  Psychiatric:        Mood and Affect: Mood normal.     ED Results / Procedures / Treatments   Labs (all labs ordered are listed, but only abnormal results  are displayed) Labs Reviewed - No data to display  EKG None  Radiology No results found.  Procedures Procedures    Medications Ordered in ED Medications  Glycerin (Adult) 2.1 g suppository 1 suppository (1 suppository Rectal Given 08/16/21 1024)  sodium phosphate (FLEET) 7-19 GM/118ML enema 1 enema (1 enema Rectal Given 08/16/21 1047)    ED Course/ Medical Decision Making/ A&P                           Medical Decision Making Risk OTC drugs.   76 year old female with medical history significant for HTN, PVD, asthma, thyroidism, GERD, HLD, obesity, hemorrhoids who presents to the emergency department with a chief complaint of constipation.  The patient denies any abdominal pain or discomfort.  She states that she normally has a bowel movement daily.  She has not had a bowel movement for the last 5 to 6 days.  She tried an enema at home without success.  She has been trying stool softeners.  She denies any rectal pain.  She denies any fevers or chills.  She denies any nausea or vomiting.  She is passing gas.  Arrival, the patient was vitally stable.  NSR noted on cardiac telemetry.  Physical exam significant for rectal exam with stool in the rectal vault, and abdomen that is Soft, nontender, nondistended with no rebound or guarding.  No evidence for fecal impaction.  Glycerin suppository was administered without success.  The patient was then administered an enema with resultant bowel movement x2.  She feels symptomatically improved at this time.  Recommended that the patient trial MiraLAX at home for continued cleanout and maintenance.  Glycerin suppositories also prescribed.  Low concern for acute intra-abdominal emergency at this time given lack of patient's symptoms and reassuring physical exam.  Overall vitally stable and stable for discharge.  Final Clinical Impression(s) / ED Diagnoses Final diagnoses:  Constipation, unspecified constipation type  Hemorrhoids, unspecified  hemorrhoid type    Rx / DC Orders ED Discharge Orders          Ordered    polyethylene glycol powder (GLYCOLAX/MIRALAX) 17 GM/SCOOP powder   Once        08/16/21 1138    glycerin adult 2 g suppository  As needed        08/16/21 1138              Regan Lemming, MD 08/16/21 1201

## 2021-08-16 NOTE — ED Triage Notes (Addendum)
Arrives EMS from home after drinking a couple glasses of wine at dinner tale. Husband reports pt "went out" for 2-3 minutes. No fall just drooped over in the chair.   Upon FD arrival pt alert and oriented.   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   Seen at [facility] today for constipation

## 2021-08-16 NOTE — ED Provider Triage Note (Signed)
  Emergency Medicine Provider Triage Evaluation Note  MRN:  [id number]  Arrival date & time: 08/16/21    Medically screening exam initiated at 10:14 PM.   CC:   Syncopal Episode   HPI:  Monica Hinton is a 76 y.o. year-old female presents to the ED with chief complaint of syncopal event at dinner.  She states that she was seated, felt hot and flushed and passed out.  She didn't fall.  Seen earlier today and [facility] for constipation.  Has had multiple large BMs today (nonbloody).  Denies any symptoms right now.  History provided by patient. ROS:  -As included in HPI PE:   Vitals:   08/16/21 2148  BP: 125/68  Pulse: 87  Resp: 16  Temp: 98.3 F (36.8 C)  SpO2: 96%    Non-toxic appearing No respiratory distress  MDM:   I've ordered labs in triage to expedite lab/diagnostic workup.  Patient was informed that the remainder of the evaluation will be completed by another provider, this initial triage assessment does not replace that evaluation, and the importance of remaining in the ED until their evaluation is complete.    Montine Circle, PA-C 08/16/21 2216

## 2021-08-17 ENCOUNTER — Observation Stay (HOSPITAL_COMMUNITY): Payer: Medicare Other

## 2021-08-17 ENCOUNTER — Ambulatory Visit: Payer: Medicare Other

## 2021-08-17 ENCOUNTER — Emergency Department (HOSPITAL_COMMUNITY): Payer: Medicare Other

## 2021-08-17 ENCOUNTER — Ambulatory Visit: Payer: Medicare Other | Admitting: Occupational Therapy

## 2021-08-17 DIAGNOSIS — E785 Hyperlipidemia, unspecified: Secondary | ICD-10-CM

## 2021-08-17 DIAGNOSIS — R079 Chest pain, unspecified: Secondary | ICD-10-CM | POA: Diagnosis not present

## 2021-08-17 DIAGNOSIS — R55 Syncope and collapse: Secondary | ICD-10-CM | POA: Diagnosis not present

## 2021-08-17 DIAGNOSIS — Z8673 Personal history of transient ischemic attack (TIA), and cerebral infarction without residual deficits: Secondary | ICD-10-CM

## 2021-08-17 DIAGNOSIS — D72829 Elevated white blood cell count, unspecified: Secondary | ICD-10-CM

## 2021-08-17 LAB — MAGNESIUM: Magnesium: 1.9 mg/dL (ref 1.7–2.4)

## 2021-08-17 LAB — DIFFERENTIAL
Abs Immature Granulocytes: 0.04 10*3/uL (ref 0.00–0.07)
Basophils Absolute: 0.1 10*3/uL (ref 0.0–0.1)
Basophils Relative: 1 %
Eosinophils Absolute: 0.3 10*3/uL (ref 0.0–0.5)
Eosinophils Relative: 3 %
Immature Granulocytes: 0 %
Lymphocytes Relative: 30 %
Lymphs Abs: 3.4 10*3/uL (ref 0.7–4.0)
Monocytes Absolute: 0.8 10*3/uL (ref 0.1–1.0)
Monocytes Relative: 7 %
Neutro Abs: 6.6 10*3/uL (ref 1.7–7.7)
Neutrophils Relative %: 59 %

## 2021-08-17 LAB — URINALYSIS, ROUTINE W REFLEX MICROSCOPIC
Bilirubin Urine: NEGATIVE
Glucose, UA: NEGATIVE mg/dL
Ketones, ur: NEGATIVE mg/dL
Nitrite: NEGATIVE
Protein, ur: NEGATIVE mg/dL
Specific Gravity, Urine: 1.012 (ref 1.005–1.030)
pH: 5 (ref 5.0–8.0)

## 2021-08-17 LAB — CBC
HCT: 38 % (ref 36.0–46.0)
Hemoglobin: 12.4 g/dL (ref 12.0–15.0)
MCH: 29.7 pg (ref 26.0–34.0)
MCHC: 32.6 g/dL (ref 30.0–36.0)
MCV: 91.1 fL (ref 80.0–100.0)
Platelets: 326 10*3/uL (ref 150–400)
RBC: 4.17 MIL/uL (ref 3.87–5.11)
RDW: 13 % (ref 11.5–15.5)
WBC: 11.3 10*3/uL — ABNORMAL HIGH (ref 4.0–10.5)
nRBC: 0 % (ref 0.0–0.2)

## 2021-08-17 LAB — TECHNOLOGIST SMEAR REVIEW: Plt Morphology: NORMAL

## 2021-08-17 LAB — RAPID URINE DRUG SCREEN, HOSP PERFORMED
Amphetamines: NOT DETECTED
Barbiturates: NOT DETECTED
Benzodiazepines: NOT DETECTED
Cocaine: NOT DETECTED
Opiates: NOT DETECTED
Tetrahydrocannabinol: NOT DETECTED

## 2021-08-17 LAB — TROPONIN I (HIGH SENSITIVITY)
Troponin I (High Sensitivity): 3 ng/L (ref <18)
Troponin I (High Sensitivity): 3 ng/L

## 2021-08-17 LAB — TSH: TSH: 0.404 u[IU]/mL (ref 0.350–4.500)

## 2021-08-17 MED ORDER — SODIUM CHLORIDE 0.9% FLUSH
3.0000 mL | INTRAVENOUS | Status: DC | PRN
  Administered 2021-08-17: 3 mL via INTRAVENOUS

## 2021-08-17 MED ORDER — ASPIRIN 81 MG PO CHEW
81.0000 mg | CHEWABLE_TABLET | Freq: Every day | ORAL | Status: DC
  Administered 2021-08-17 – 2021-08-18 (×2): 81 mg via ORAL
  Filled 2021-08-17 (×2): qty 1

## 2021-08-17 MED ORDER — ACETAMINOPHEN 325 MG PO TABS
650.0000 mg | ORAL_TABLET | Freq: Once | ORAL | Status: AC
Start: 2021-08-17 — End: 2021-08-17
  Administered 2021-08-17: 650 mg via ORAL
  Filled 2021-08-17: qty 2

## 2021-08-17 MED ORDER — ACETAMINOPHEN 650 MG RE SUPP: 650.0000 mg | Freq: Four times a day (QID) | RECTAL | Status: DC | PRN

## 2021-08-17 MED ORDER — SODIUM CHLORIDE 0.9% FLUSH: 3.0000 mL | Freq: Two times a day (BID) | INTRAVENOUS | Status: DC

## 2021-08-17 MED ORDER — SODIUM CHLORIDE 0.9% FLUSH
3.0000 mL | Freq: Two times a day (BID) | INTRAVENOUS | Status: DC
  Administered 2021-08-17 – 2021-08-18 (×2): 3 mL via INTRAVENOUS

## 2021-08-17 MED ORDER — TRIAMCINOLONE ACETONIDE 55 MCG/ACT NA AERO
2.0000 | INHALATION_SPRAY | Freq: Every day | NASAL | Status: DC
  Administered 2021-08-18: 2 via NASAL
  Filled 2021-08-17: qty 21.6

## 2021-08-17 MED ORDER — GABAPENTIN 300 MG PO CAPS
600.0000 mg | ORAL_CAPSULE | Freq: Three times a day (TID) | ORAL | Status: DC
  Administered 2021-08-17 – 2021-08-18 (×2): 600 mg via ORAL
  Filled 2021-08-17 (×2): qty 2

## 2021-08-17 MED ORDER — ACETAMINOPHEN 325 MG PO TABS
650.0000 mg | ORAL_TABLET | Freq: Four times a day (QID) | ORAL | Status: DC | PRN
  Administered 2021-08-17: 650 mg via ORAL
  Filled 2021-08-17: qty 2

## 2021-08-17 MED ORDER — LOSARTAN POTASSIUM 50 MG PO TABS
50.0000 mg | ORAL_TABLET | Freq: Every day | ORAL | Status: DC
  Administered 2021-08-17 – 2021-08-18 (×2): 50 mg via ORAL
  Filled 2021-08-17 (×2): qty 1

## 2021-08-17 MED ORDER — SODIUM CHLORIDE 0.9 % IV SOLN: 250.0000 mL | INTRAVENOUS | Status: DC | PRN

## 2021-08-17 MED ORDER — PANTOPRAZOLE SODIUM 40 MG PO TBEC
40.0000 mg | DELAYED_RELEASE_TABLET | Freq: Every day | ORAL | Status: DC
  Administered 2021-08-17 – 2021-08-18 (×2): 40 mg via ORAL
  Filled 2021-08-17 (×2): qty 1

## 2021-08-17 MED ORDER — MONTELUKAST SODIUM 10 MG PO TABS
10.0000 mg | ORAL_TABLET | Freq: Every day | ORAL | Status: DC
  Administered 2021-08-18: 10 mg via ORAL
  Filled 2021-08-17: qty 1

## 2021-08-17 MED ORDER — ROSUVASTATIN CALCIUM 5 MG PO TABS
10.0000 mg | ORAL_TABLET | Freq: Every day | ORAL | Status: DC
  Administered 2021-08-17 – 2021-08-18 (×2): 10 mg via ORAL
  Filled 2021-08-17 (×2): qty 2

## 2021-08-17 MED ORDER — LEVOTHYROXINE SODIUM 112 MCG PO TABS
112.0000 ug | ORAL_TABLET | Freq: Every day | ORAL | Status: DC
  Administered 2021-08-18: 112 ug via ORAL
  Filled 2021-08-17: qty 1

## 2021-08-17 NOTE — Progress Notes (Signed)
EEG complete - results pending 

## 2021-08-17 NOTE — ED Provider Notes (Signed)
[facility] EMERGENCY DEPARTMENT Provider Note   CSN: [id number] Arrival date & time: 08/16/21  2118     History  Chief Complaint  Patient presents with   Syncopal Episode    Monica Hinton is a 75 y.o. female with history of hypertension, hyperlipidemia, recent stroke, presenting to the emergency room for syncope.  Patient reports that last night, she felt lightheaded, woozy, and then lost consciousness.  The patient's husband reports that she suddenly became unresponsive.  He reports that he tried to arouse her for 5 minutes, without success, at which point he called EMS.  Husband reports that he thinks it took EMS around 20 minutes to arrive, at which point patient was still EMS unresponsive, but aroused quickly, reportedly with low blood pressure and low pulse rate.  Patient otherwise denies any chest pain, fevers, chills, cough, headache, numbness, tingling.  She has some chronic right arm and leg weakness from the stroke which she reports is unchanged.  She reports currently, feels at baseline  HPI     Home Medications Prior to Admission medications   Medication Sig Start Date End Date Taking? Authorizing Provider  acetaminophen (TYLENOL) 500 MG tablet Take 1,000 mg by mouth 3 (three) times daily.    [provider]  aspirin 81 MG chewable tablet Chew 1 tablet (81 mg total) by mouth daily. 07/26/21 07/21/22  Elodia Florence., MD  gabapentin (NEURONTIN) 300 MG capsule Take 600 mg by mouth 3 (three) times daily. 06/08/19   [provider]  glycerin adult 2 g suppository Place 1 suppository rectally as needed for constipation. 08/16/21   Regan Lemming, MD  levothyroxine (SYNTHROID) 112 MCG tablet Take 112 mcg by mouth daily before breakfast. 06/29/19   [provider]  losartan (COZAAR) 100 MG tablet Take 100 mg by mouth daily. 07/10/21   [provider]  losartan (COZAAR) 50 MG tablet Take 50 mg by mouth daily. 08/16/21   [provider]  montelukast (SINGULAIR) 10 MG tablet Take 1 tablet by mouth daily.    [provider]  polyethylene glycol powder (GLYCOLAX/MIRALAX) 17 GM/SCOOP powder Take 238 g by mouth once for 1 dose. 08/16/21 08/17/21  Regan Lemming, MD  RABEprazole (ACIPHEX) 20 MG tablet Take 20 mg by mouth daily before breakfast.     [provider]  rosuvastatin (CRESTOR) 10 MG tablet Take 10 mg by mouth daily.     [provider]  Vitamin D, Ergocalciferol, (DRISDOL) 1.25 MG (50000 UNIT) CAPS capsule Take 50,000 Units by mouth every Monday. 07/31/19   [provider]      Allergies    Yellow jacket venom [bee venom], Codeine, Milk (cow), Other, Penicillins, Procaine, Simvastatin, and Chlorhexidine    Review of Systems   Review of Systems See HPI   Physical Exam Updated Vital Signs BP 139/66   Pulse 90   Temp 98.5 F (36.9 C)   Resp 15   SpO2 98%  Physical Exam Constitutional:      General: She is not in acute distress.    Appearance: She is well-developed.  HENT:     Head: Normocephalic and atraumatic.     Mouth/Throat:     Mouth: Mucous membranes are moist.  Eyes:     Pupils: Pupils are equal, round, and reactive to light.  Cardiovascular:     Rate and Rhythm: Normal rate and regular rhythm.     Heart sounds: No murmur heard. Pulmonary:  Effort: Pulmonary effort is normal. No respiratory distress.     Breath sounds: Normal breath sounds.  Abdominal:     General: Abdomen is flat.     Palpations: Abdomen is soft.     Tenderness: There is no abdominal tenderness.  Musculoskeletal:        General: No tenderness.     Right lower leg: No edema.     Left lower leg: No edema.  Skin:    General: Skin is warm and dry.  Neurological:     General: No focal deficit present.     Mental Status: She is alert. Mental status is at baseline.     Comments: 5 out of 5 strength in the left upper and lower extremity, 4+ out of 5 strength in the right upper  and lower extremity.  Cranial nerves II through XII intact.  No dysmetria on finger-nose-finger testing  Psychiatric:        Mood and Affect: Mood normal.        Behavior: Behavior normal.     ED Results / Procedures / Treatments   Labs (all labs ordered are listed, but only abnormal results are displayed) Labs Reviewed  CBC WITH DIFFERENTIAL/PLATELET - Abnormal; Notable for the following components:      Result Value   WBC 14.2 (*)    Neutro Abs 10.2 (*)    All other components within normal limits  BASIC METABOLIC PANEL - Abnormal; Notable for the following components:   Glucose, Bld 122 (*)    All other components within normal limits  URINALYSIS, ROUTINE W REFLEX MICROSCOPIC - Abnormal; Notable for the following components:   APPearance HAZY (*)    Hgb urine dipstick SMALL (*)    Leukocytes,Ua TRACE (*)    Bacteria, UA FEW (*)    Non Squamous Epithelial 0-5 (*)    All other components within normal limits  CBG MONITORING, ED - Abnormal; Notable for the following components:   Glucose-Capillary 124 (*)    All other components within normal limits  RAPID URINE DRUG SCREEN, HOSP PERFORMED  TROPONIN I (HIGH SENSITIVITY)  TROPONIN I (HIGH SENSITIVITY)    EKG EKG Interpretation  Date/Time:  Thursday August 17 2021 12:20:07 EDT Ventricular Rate:  86 PR Interval:  246 QRS Duration: 91 QT Interval:  373 QTC Calculation: 447 R Axis:   96 Text Interpretation: Sinus rhythm Prolonged PR interval Consider left atrial enlargement Right axis deviation Low voltage, extremity and precordial leads Confirmed by Garnette Gunner [phone]) on 08/17/2021 2:15:24 PM  Radiology CT Head Wo Contrast  Result Date: 08/17/2021 CLINICAL DATA:  Syncope EXAM: CT HEAD WITHOUT CONTRAST TECHNIQUE: Contiguous axial images were obtained from the base of the skull through the vertex without intravenous contrast. RADIATION DOSE REDUCTION: This exam was performed according to the departmental  dose-optimization program which includes automated exposure control, adjustment of the mA and/or kV according to patient size and/or use of iterative reconstruction technique. COMPARISON:  CT brain, 07/23/2021 MR brain, 07/24/2021 FINDINGS: Brain: Edematous subacute infarction of the left corona radiata (series 3, image 19). No evidence of new acute infarction, hemorrhage, hydrocephalus, extra-axial collection or mass lesion/mass effect. Periventricular and deep white matter hypodensity. Vascular: No hyperdense vessel or unexpected calcification. Skull: Normal. Negative for fracture or focal lesion. Sinuses/Orbits: No acute finding. Other: None. IMPRESSION: 1. 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 2. No evidence of new acute intracranial pathology. Small-vessel white matter disease. Electronically  Signed   By: Delanna Ahmadi M.D.   On: 08/17/2021 13:11   DG Chest 1 View  Result Date: 08/17/2021 CLINICAL DATA:  Chest pain. EXAM: CHEST  1 VIEW COMPARISON:  None Available. FINDINGS: The heart size and mediastinal contours are within normal limits. Both lungs are clear. The visualized skeletal structures are unremarkable. IMPRESSION: No active disease. Electronically Signed   By: Marijo Conception M.D.   On: 08/17/2021 12:47    Procedures Procedures    Medications Ordered in ED Medications - No data to display  ED Course/ Medical Decision Making/ A&P Clinical Course as of 08/17/21 1415  Thu Aug 17, 2021  1341 Admitted to medicine for Syncope. Discussed with Dr. Rogers Blocker [WS]    Clinical Course User Index [WS] Truett Mainland Livingston Diones, MD                           Medical Decision Making Amount and/or Complexity of Data Reviewed Radiology: ordered.  Risk Decision regarding hospitalization.   76 year old female presenting to the emergency department after syncopal episode.  Patient well-appearing, vital signs reassuring.  EKG with  first-degree AV block.  Chest x-ray on my interpretation is clear without evidence of pneumonia, pneumothorax.  Unclear cause of syncope, with prodromal symptoms, low heart rate and blood pressure, could be vasovagal, however given the length, and no clear trigger, it would be atypical.  Could also represent cardiac although patient did have echo when she was in the hospital which was overall reassuring.  Doubt neurologic, neuro exam consistent with prior stroke without new findings, CT head with signs of old stroke without any acute finding.  Labs reassuring including negative troponin, normal blood glucose on BMP.  Given length of syncope, age, stroke, will admit for further observation and monitoring.  Discussed with the hospitalist Dr. Eliberto Ivory who will admit the patient          Final Clinical Impression(s) / ED Diagnoses Final diagnoses:  Syncope, unspecified syncope type  History of stroke    Rx / DC Orders ED Discharge Orders     None         Cristie Hem, MD 08/17/21 1417

## 2021-08-17 NOTE — ED Notes (Signed)
Verbal okay to bring pt up. [place] remains yellow.

## 2021-08-17 NOTE — Assessment & Plan Note (Addendum)
76 year old presenting with witnessed syncope x 15+ minutes in setting of acute CVA -obs to telemetry -orthostatic vitals -losartan was increased to '100mg'$  recently.  -recent echo 3 weeks ago with no aortic stenosis, normal EF and grade 1 DD -head CT with edematous subacute infarction of the left corona radiata, seen acutely by MR on 07/24/21 with no acute findings -troponin wnl x 2 -frequent neuro checks -routine eeg. If abnormal consult neurology. Discussed with on call neurology)  -TED hose

## 2021-08-17 NOTE — Assessment & Plan Note (Signed)
Continue crestor '10mg'$  daily

## 2021-08-17 NOTE — Assessment & Plan Note (Signed)
History of leukocytosis x 2-3 years. Has never seen oncology Will check smear

## 2021-08-17 NOTE — H&P (Signed)
History and Physical    Patient: Monica Hinton DGL:[id number] DOB: 1945-07-02 DOA: 08/16/2021 DOS: the patient was seen and examined on 08/17/2021 PCP: Orpah Melter, MD  Patient coming from: Home - lives with her husband. Uses walker.    Chief Complaint: syncopal episode   HPI: Monica Hinton is a 76 y.o. 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 She was at home with her husband who witnessed episode. She was sitting and eating dinner and had acute lightheadedness, was hot and sweaty and then passed out in the chair. She stayed in the chair. Her husband states he waited about 1 minute before he called 911. He states she was out for 15-20 minutes. When she came to she was dazed for a couple minutes, but then was talking and conversing with EMS. She did not bite her tongue and did not urinate on herself. No history of seizures.   She was seen in ED yesterday for constipation. She also took on macrobid as she was concerned she could be getting a UTI due to everything going on "down there."   He has been feeling good. Denies any fever/chills, vision changes/headaches, chest pain or palpitations, shortness of breath or cough, abdominal pain, N/V/D, dysuria or leg swelling.    She does not smoke. Drinks wine.   ER Course:  vitals: afebrile, bp: 155/81, HR: 84, RR: 17, oxygen: 96%RA Pertinent labs: wbc: 14.2, troponin negative x2 CXR: no acute finding Head CT: Edematous subacute infarction of the left corona radiata, seen acutely by MR on examination dated 07/24/2021.no new acute finding.    Review of Systems: As mentioned in the history of present illness. All other systems reviewed and are negative. Past Medical History:  Diagnosis Date   Allergic rhinitis    Asthma    seasonal    Bursitis    r hip    GERD  (gastroesophageal reflux disease)    High cholesterol    Hypercholesteremia    Hypertension    Hypothyroidism    Hypothyroidism    Insomnia    Leukocytosis    Obesity    Peripheral vascular disease ([place])    bilateral LE edema ; edma in hands as well    PONV (postoperative nausea and vomiting)    Thrombocytosis    Vitamin D deficiency    Past Surgical History:  Procedure Laterality Date   bladder      bladder tack  20 years ago    COLONOSCOPY     EYE SURGERY Bilateral    catracts   hysterectomy      20 years    OPEN SURGICAL REPAIR OF GLUTEAL TENDON Right 09/19/2016   Procedure: Right hip bursectomy and gluteal tendon repair;  Surgeon: Gaynelle Arabian, MD;  Location: [facility];  Service: Orthopedics;  Laterality: Right;   TONSILLECTOMY     age 89    TOTAL ANKLE ARTHROPLASTY Right 04/27/2021   Procedure: TOTAL ANKLE ARTHROPLASTY;  Surgeon: Wylene Simmer, MD;  Location: [facility];  Service: Orthopedics;  Laterality: Right;   Social History:  reports that she has quit smoking. Her smoking use included cigarettes. She has a 3.00 pack-year smoking history. She has never used smokeless tobacco. She reports current alcohol use. She reports that she does not use drugs.  Allergies  Allergen Reactions   Yellow Jacket Venom [Bee Venom] Anaphylaxis  Pt has EPI PEN   Codeine Nausea And Vomiting and Other (See Comments)   Milk (Cow)    Other     Lactose Intolerant     Penicillins Hives and Other (See Comments)    Many years - dr does not put pt on this   Procaine Other (See Comments)    Other reaction(s): fever,chills   Simvastatin Other (See Comments)    Other reaction(s): rash   Chlorhexidine Rash    With preop washes - patient states fine to use Chloraprep    Family History  Problem Relation Age of Onset   Colon cancer Mother        60s    Prior to Admission medications   Medication Sig Start Date End Date Taking? Authorizing Provider  acetaminophen (TYLENOL)  500 MG tablet Take 1,000 mg by mouth 3 (three) times daily.    [provider]  aspirin 81 MG chewable tablet Chew 1 tablet (81 mg total) by mouth daily. 07/26/21 07/21/22  Elodia Florence., MD  gabapentin (NEURONTIN) 300 MG capsule Take 600 mg by mouth 3 (three) times daily. 06/08/19   [provider]  glycerin adult 2 g suppository Place 1 suppository rectally as needed for constipation. 08/16/21   Regan Lemming, MD  levothyroxine (SYNTHROID) 112 MCG tablet Take 112 mcg by mouth daily before breakfast. 06/29/19   [provider]  losartan (COZAAR) 100 MG tablet Take 100 mg by mouth daily. 07/10/21   [provider]  montelukast (SINGULAIR) 10 MG tablet Take 1 tablet by mouth daily.    [provider]  polyethylene glycol powder (GLYCOLAX/MIRALAX) 17 GM/SCOOP powder Take 238 g by mouth once for 1 dose. 08/16/21 08/17/21  Regan Lemming, MD  RABEprazole (ACIPHEX) 20 MG tablet Take 20 mg by mouth daily before breakfast.     [provider]  rosuvastatin (CRESTOR) 10 MG tablet Take 10 mg by mouth daily.     [provider]  Vitamin D, Ergocalciferol, (DRISDOL) 1.25 MG (50000 UNIT) CAPS capsule Take 50,000 Units by mouth every Monday. 07/31/19   [provider]    Physical Exam: Vitals:   08/17/21 1500 08/17/21 1515 08/17/21 1545 08/17/21 1719  BP: (!) 166/87 (!) 168/65 (!) 162/78 (!) 142/77  Pulse: 99 94 99 97  Resp: 17 18 (!) 22 18  Temp:      TempSrc:      SpO2: 98% 98% 97% 98%   General:  Appears calm and comfortable and is in NAD Eyes:  PERRL, EOMI, normal lids, iris ENT:  grossly normal hearing, lips & tongue, mmm; appropriate dentition Neck:  no LAD, masses or thyromegaly; no carotid bruits Cardiovascular:  RRR, +murmur.  Trace edema right ankle.  Respiratory:   CTA bilaterally with no wheezes/rales/rhonchi.  Normal respiratory effort. Abdomen:  soft, NT, ND, NABS Back:   normal alignment, no CVAT Skin:  no rash or  induration seen on limited exam Musculoskeletal:  grossly normal tone BUE/BLE, good ROM, no bony abnormality Lower extremity:  No LE edema.  Limited foot exam with no ulcerations.  2+ distal pulses. Psychiatric:  grossly normal mood and affect, speech fluent and appropriate, AOx3 Neurologic:  CN 2-12 grossly intact, moves all extremities in coordinated fashion, sensation intact. DTR 2+. FTN intact bilaterally. Gait deferred.    Radiological Exams on Admission: Independently reviewed - see discussion in A/P where applicable  CT Head Wo Contrast  Result Date: 08/17/2021 CLINICAL DATA:  Syncope EXAM: CT HEAD WITHOUT  CONTRAST TECHNIQUE: Contiguous axial images were obtained from the base of the skull through the vertex without intravenous contrast. RADIATION DOSE REDUCTION: This exam was performed according to the departmental dose-optimization program which includes automated exposure control, adjustment of the mA and/or kV according to patient size and/or use of iterative reconstruction technique. COMPARISON:  CT brain, 07/23/2021 MR brain, 07/24/2021 FINDINGS: Brain: Edematous subacute infarction of the left corona radiata (series 3, image 19). No evidence of new acute infarction, hemorrhage, hydrocephalus, extra-axial collection or mass lesion/mass effect. Periventricular and deep white matter hypodensity. Vascular: No hyperdense vessel or unexpected calcification. Skull: Normal. Negative for fracture or focal lesion. Sinuses/Orbits: No acute finding. Other: None. IMPRESSION: 1. 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 2. No evidence of new acute intracranial pathology. Small-vessel white matter disease. Electronically Signed   By: Delanna Ahmadi M.D.   On: 08/17/2021 13:11   DG Chest 1 View  Result Date: 08/17/2021 CLINICAL DATA:  Chest pain. EXAM: CHEST  1 VIEW COMPARISON:  None Available. FINDINGS: The heart size and mediastinal  contours are within normal limits. Both lungs are clear. The visualized skeletal structures are unremarkable. IMPRESSION: No active disease. Electronically Signed   By: Marijo Conception M.D.   On: 08/17/2021 12:47    EKG: Independently reviewed.  NSR with sinus arrhythmia with rate 75; nonspecific ST changes with no evidence of acute ischemia Repeat NSR rate of 86   Labs on Admission: I have personally reviewed the available labs and imaging studies at the time of the admission.  Pertinent labs:   wbc: 14.2,  troponin negative x2  Assessment and Plan: Principal Problem:   Syncope Active Problems:   Leukocytosis   Essential hypertension   Dyslipidemia   History of CVA (cerebrovascular accident)    Assessment and Plan: * Syncope 76 year old presenting with witnessed syncope x 15+ minutes in setting of acute CVA -obs to telemetry -orthostatic vitals -losartan was increased to '100mg'$  recently.  -recent echo 3 weeks ago with no aortic stenosis, normal EF and grade 1 DD -head CT with edematous subacute infarction of the left corona radiata, seen acutely by MR on 07/24/21 with no acute findings -troponin wnl x 2 -frequent neuro checks -routine eeg. If abnormal consult neurology. Discussed with on call neurology)  -TED hose   Leukocytosis History of leukocytosis x 2-3 years. Has never seen oncology Will check smear   Dyslipidemia Continue crestor '10mg'$  daily   Essential hypertension Blood pressures low with EMS and have fluctuated here Start back losartan at '50mg'$  instead of '100mg'$  Orthostatic vitals pending   History of CVA (cerebrovascular accident) History of CVA on 07/24/21 Just finished her 3 weeks of plavix yesterday and on ASA only  CT head with no acute finding and no neuro deficits on exam  Continue ASA/crestor     Advance Care Planning:   Code Status: Full Code   Consults: none   DVT Prophylaxis: TED hose/ambulation   Family Communication: husband at bedside:  Monica Hinton   Severity of Illness: The appropriate patient status for this patient is OBSERVATION. Observation status is judged to be reasonable and necessary in order to provide the required intensity of service to ensure the patient's safety. The patient's presenting symptoms, physical exam findings, and initial radiographic and laboratory data in the context of their medical condition is felt to place them at decreased risk for further clinical deterioration. Furthermore, it is anticipated that the  patient will be medically stable for discharge from the hospital within 2 midnights of admission.   Author: Orma Flaming, MD 08/17/2021 6:42 PM  For on call review [email or web address].

## 2021-08-17 NOTE — Assessment & Plan Note (Addendum)
History of CVA on 07/24/21 Just finished her 3 weeks of plavix yesterday and on ASA only  CT head with no acute finding and no neuro deficits on exam  Continue ASA/crestor

## 2021-08-17 NOTE — Assessment & Plan Note (Signed)
Blood pressures low with EMS and have fluctuated here Start back losartan at '50mg'$  instead of '100mg'$  Orthostatic vitals pending

## 2021-08-18 DIAGNOSIS — Z8673 Personal history of transient ischemic attack (TIA), and cerebral infarction without residual deficits: Secondary | ICD-10-CM

## 2021-08-18 DIAGNOSIS — E785 Hyperlipidemia, unspecified: Secondary | ICD-10-CM | POA: Diagnosis not present

## 2021-08-18 DIAGNOSIS — R55 Syncope and collapse: Secondary | ICD-10-CM | POA: Diagnosis not present

## 2021-08-18 LAB — CBC
HCT: 36.8 % (ref 36.0–46.0)
Hemoglobin: 11.8 g/dL — ABNORMAL LOW (ref 12.0–15.0)
MCH: 29.4 pg (ref 26.0–34.0)
MCHC: 32.1 g/dL (ref 30.0–36.0)
MCV: 91.8 fL (ref 80.0–100.0)
Platelets: 307 10*3/uL (ref 150–400)
RBC: 4.01 MIL/uL (ref 3.87–5.11)
RDW: 13 % (ref 11.5–15.5)
WBC: 7.5 10*3/uL (ref 4.0–10.5)
nRBC: 0 % (ref 0.0–0.2)

## 2021-08-18 LAB — GLUCOSE, CAPILLARY: Glucose-Capillary: 106 mg/dL — ABNORMAL HIGH (ref 70–99)

## 2021-08-18 LAB — BASIC METABOLIC PANEL
Anion gap: 13 (ref 5–15)
BUN: 10 mg/dL (ref 8–23)
CO2: 22 mmol/L (ref 22–32)
Calcium: 9.2 mg/dL (ref 8.9–10.3)
Chloride: 104 mmol/L (ref 98–111)
Creatinine, Ser: 0.53 mg/dL (ref 0.44–1.00)
GFR, Estimated: >60 mL/min (ref >60)
Glucose, Bld: 106 mg/dL — ABNORMAL HIGH (ref 70–99)
Potassium: 3.4 mmol/L — ABNORMAL LOW (ref 3.5–5.1)
Sodium: 139 mmol/L (ref 135–145)

## 2021-08-18 MED ORDER — POTASSIUM CHLORIDE CRYS ER 20 MEQ PO TBCR
40.0000 meq | EXTENDED_RELEASE_TABLET | Freq: Once | ORAL | Status: AC
  Administered 2021-08-18: 40 meq via ORAL
  Filled 2021-08-18: qty 2

## 2021-08-18 MED ORDER — POLYETHYLENE GLYCOL 3350 17 GM/SCOOP PO POWD: 1.0000 | Freq: Once | ORAL | 0 refills | Status: AC

## 2021-08-18 NOTE — Discharge Summary (Signed)
Physician Discharge Summary  Monica Hinton TFT:[id number] DOB: June 19, 1945 DOA: 08/16/2021  PCP: Orpah Melter, MD  Admit date: 08/16/2021 Discharge date: 08/18/2021  Admitted From: Home Disposition: Home with outpatient rehab  Recommendations for Outpatient Follow-up:  Follow up with PCP in 1-2 weeks   Home Health: N/A Equipment/Devices: N/A  Discharge Condition: Stable CODE STATUS: Full code Diet recommendation: Low-salt diet  Discharge summary: 76 year old female with history of recent stroke, hypertension hyperlipidemia and hypothyroidism presented from home with 1 episode of syncope happened during eating dinner.  She felt hot and sweaty and passed out in the chair, no trauma, no fall, no incontinence no seizure-like activities.  Witnessed by husband.  Work-up when EMS arrived.  She had suffered from constipation and was given a Fleet enema the day before in the emergency room.  EMS did report initial systolic blood pressure of 60 at home, improved on subsequent exam. On arrival to emergency room, hemodynamically stable.  No acute findings.  CT head with expected evolution from subacute infarct left corona radiata.  EEG without any acute findings.  Negative for orthostatic since arrival.  Syncope: Suspect vasovagal episode.  No other neurological deficits.  No evidence of new stroke. She will continue to take her antiplatelet therapy, statin, antihypertensives.  She will hydrate well.  Monitor for further symptoms.  Avoid constipation and use laxatives every day. Patient has a schedule appointment with neurology in 2 weeks that she will keep up.   Discharge Diagnoses:  Principal Problem:   Syncope Active Problems:   Leukocytosis   Essential hypertension   Dyslipidemia   History of CVA (cerebrovascular accident)    Discharge Instructions  Discharge Instructions     Ambulatory referral to Occupational Therapy   Complete by: As directed    Ambulatory referral to Physical  Therapy   Complete by: As directed    Pt is cleared to continue with outpatient rehab   Diet - low sodium heart healthy   Complete by: As directed    Increase activity slowly   Complete by: As directed       Allergies as of 08/18/2021       Reactions   Yellow Jacket Venom [bee Venom] Anaphylaxis   Pt has EPI PEN   Codeine Nausea And Vomiting, Other (See Comments)   Milk (cow)    Other    Lactose Intolerant    Penicillins Hives, Other (See Comments)   Many years - dr does not put pt on this   Procaine Other (See Comments)   Other reaction(s): fever,chills   Simvastatin Other (See Comments)   Other reaction(s): rash   Chlorhexidine Rash   With preop washes - patient states fine to use Chloraprep        Medication List     TAKE these medications    acetaminophen 500 MG tablet Commonly known as: TYLENOL Take 1,000 mg by mouth 3 (three) times daily.   aspirin 81 MG chewable tablet Chew 1 tablet (81 mg total) by mouth daily.   gabapentin 300 MG capsule Commonly known as: NEURONTIN Take 600 mg by mouth 3 (three) times daily.   glycerin adult 2 g suppository Place 1 suppository rectally as needed for constipation.   levothyroxine 112 MCG tablet Commonly known as: SYNTHROID Take 112 mcg by mouth daily before breakfast.   losartan 100 MG tablet Commonly known as: COZAAR Take 100 mg by mouth daily.   montelukast 10 MG tablet Commonly known as: SINGULAIR Take 1 tablet by mouth  daily.   Nasacort Allergy 24HR 55 MCG/ACT Aero nasal inhaler Generic drug: triamcinolone Place 2 sprays into the nose daily.   polyethylene glycol powder 17 GM/SCOOP powder Commonly known as: GLYCOLAX/MIRALAX Take 238 g by mouth once for 1 dose.   RABEprazole 20 MG tablet Commonly known as: ACIPHEX Take 20 mg by mouth daily before breakfast.   rosuvastatin 10 MG tablet Commonly known as: CRESTOR Take 10 mg by mouth daily.   Vitamin D (Ergocalciferol) 1.25 MG (50000 UNIT) Caps  capsule Commonly known as: DRISDOL Take 50,000 Units by mouth every Monday. Patient takes on Wednesday        Follow-up [facility].   Specialty: Rehabilitation Why: continue with outpatient rehab as scheduled Contact information: [place] [id number] [place] [id number] [phone]               Allergies  Allergen Reactions   Yellow Jacket Venom [Bee Venom] Anaphylaxis    Pt has EPI PEN   Codeine Nausea And Vomiting and Other (See Comments)   Milk (Cow)    Other     Lactose Intolerant     Penicillins Hives and Other (See Comments)    Many years - dr does not put pt on this   Procaine Other (See Comments)    Other reaction(s): fever,chills   Simvastatin Other (See Comments)    Other reaction(s): rash   Chlorhexidine Rash    With preop washes - patient states fine to use Chloraprep    Consultations: None   Procedures/Studies: EEG adult  Result Date: 2021-09-17 Lora Havens, MD     2021/09/17  8:48 AM Patient Name: Monica Hinton MRN: [id number] Epilepsy Attending: Lora Havens Referring Physician/Provider: Orma Flaming, MD Date: 08/17/2021 Duration: 22.28 mins Patient history: 76 year old female with syncope.  EEG evaluate for seizure. Level of alertness: Awake, asleep AEDs during EEG study: None Technical aspects: This EEG study was done with scalp electrodes positioned according to the 10-20 International system of electrode placement. Electrical activity was reviewed with band pass filter of 1-'70Hz'$ , sensitivity of 7 uV/mm, display speed of 54m/sec with a '60Hz'$  notched filter applied as appropriate. EEG data were recorded continuously and digitally stored.  Video monitoring was available and reviewed as appropriate. Description: The posterior dominant rhythm consists of 8 Hz activity of moderate voltage (25-35 uV) seen predominantly in posterior head regions,  symmetric and reactive to eye opening and eye closing. Sleep was characterized by vertex waves, sleep spindles (12 to 14 Hz), maximal frontocentral region.  Single sharp transients was noted in the RIGHT temporal region.  Hyperventilation and photic stimulation were not performed.   IMPRESSION: This study is within normal limits. No seizures or epileptiform discharges were seen throughout the recording. PLora Havens  CT Head Wo Contrast  Result Date: 08/17/2021 CLINICAL DATA:  Syncope EXAM: CT HEAD WITHOUT CONTRAST TECHNIQUE: Contiguous axial images were obtained from the base of the skull through the vertex without intravenous contrast. RADIATION DOSE REDUCTION: This exam was performed according to the departmental dose-optimization program which includes automated exposure control, adjustment of the mA and/or kV according to patient size and/or use of iterative reconstruction technique. COMPARISON:  CT brain, 07/23/2021 MR brain, 07/24/2021 FINDINGS: Brain: Edematous subacute infarction of the left corona radiata (series 3, image 19). No evidence of new acute infarction, hemorrhage, hydrocephalus, extra-axial collection or mass lesion/mass effect. Periventricular and deep white matter hypodensity. Vascular:  No hyperdense vessel or unexpected calcification. Skull: Normal. Negative for fracture or focal lesion. Sinuses/Orbits: No acute finding. Other: None. IMPRESSION: 1. 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 2. No evidence of new acute intracranial pathology. Small-vessel white matter disease. Electronically Signed   By: Delanna Ahmadi M.D.   On: 08/17/2021 13:11   DG Chest 1 View  Result Date: 08/17/2021 CLINICAL DATA:  Chest pain. EXAM: CHEST  1 VIEW COMPARISON:  None Available. FINDINGS: The heart size and mediastinal contours are within normal limits. Both lungs are clear. The visualized skeletal structures are unremarkable.  IMPRESSION: No active disease. Electronically Signed   By: Marijo Conception M.D.   On: 08/17/2021 12:47   ECHOCARDIOGRAM COMPLETE  Result Date: 07/25/2021    ECHOCARDIOGRAM REPORT   Patient Name:   Monica Hinton Date of Exam: 07/25/2021 Medical Rec #:  [id number]       Height:       63.0 in Accession #:    [id number]      Weight:       169.0 lb Date of Birth:  [birth year]      BSA:          1.800 m Patient Age:    71 years        BP:           137/91 mmHg Patient Gender: F               HR:           71 bpm. Exam Location:  Inpatient Procedure: 2D Echo, Cardiac Doppler and Color Doppler Indications:    TIA  History:        Patient has no prior history of Echocardiogram examinations.  Sonographer:    Merrie Roof RDCS Referring Phys: [id number] [place]  1. Left ventricular ejection fraction, by estimation, is 55 to 60%. The left ventricle has normal function. The left ventricle has no regional wall motion abnormalities. Left ventricular diastolic parameters are consistent with Grade I diastolic dysfunction (impaired relaxation).  2. Right ventricular systolic function is normal. The right ventricular size is normal. There is normal pulmonary artery systolic pressure. The estimated right ventricular systolic pressure is 16.0 mmHg.  3. The mitral valve is normal in structure. No evidence of mitral valve regurgitation.  4. The aortic valve is tricuspid. There is mild calcification of the aortic valve. There is mild thickening of the aortic valve. Aortic valve regurgitation is not visualized. Aortic valve sclerosis/calcification is present, without any evidence of aortic stenosis. FINDINGS  Left Ventricle: Left ventricular ejection fraction, by estimation, is 55 to 60%. The left ventricle has normal function. The left ventricle has no regional wall motion abnormalities. The left ventricular internal cavity size was normal in size. There is  no left ventricular hypertrophy. Left ventricular diastolic  parameters are consistent with Grade I diastolic dysfunction (impaired relaxation). Normal left ventricular filling pressure. Right Ventricle: The right ventricular size is normal. No increase in right ventricular wall thickness. Right ventricular systolic function is normal. There is normal pulmonary artery systolic pressure. The tricuspid regurgitant velocity is 1.95 m/s, and  with an assumed right atrial pressure of 3 mmHg, the estimated right ventricular systolic pressure is 10.9 mmHg. Left Atrium: Left atrial size was normal in size. Right Atrium: Right atrial size was normal in size. Pericardium: There is no evidence of pericardial effusion. Mitral Valve: The mitral valve is  normal in structure. Mild mitral annular calcification. No evidence of mitral valve regurgitation. Tricuspid Valve: The tricuspid valve is normal in structure. Tricuspid valve regurgitation is trivial. Aortic Valve: The aortic valve is tricuspid. There is mild calcification of the aortic valve. There is mild thickening of the aortic valve. Aortic valve regurgitation is not visualized. Aortic valve sclerosis/calcification is present, without any evidence of aortic stenosis. Aortic valve mean gradient measures 3.0 mmHg. Aortic valve peak gradient measures 6.2 mmHg. Aortic valve area, by VTI measures 1.91 cm. Pulmonic Valve: The pulmonic valve was grossly normal. Pulmonic valve regurgitation is not visualized. Aorta: The aortic root and ascending aorta are structurally normal, with no evidence of dilitation. IAS/Shunts: No atrial level shunt detected by color flow Doppler.  LEFT VENTRICLE PLAX 2D LVIDd:         3.80 cm   Diastology LVIDs:         2.60 cm   LV e' medial:    10.40 cm/s LV PW:         0.80 cm   LV E/e' medial:  8.8 LV IVS:        0.70 cm   LV e' lateral:   8.55 cm/s LVOT diam:     1.70 cm   LV E/e' lateral: 10.7 LV SV:         51 LV SV Index:   28 LVOT Area:     2.27 cm  RIGHT VENTRICLE RV Basal diam:  3.10 cm RV S prime:      14.50 cm/s TAPSE (M-mode): 2.6 cm LEFT ATRIUM             Index        RIGHT ATRIUM           Index LA diam:        3.10 cm 1.72 cm/m   RA Area:     20.10 cm LA Vol (A2C):   75.8 ml 42.11 ml/m  RA Volume:   55.40 ml  30.78 ml/m LA Vol (A4C):   36.3 ml 20.17 ml/m LA Biplane Vol: 57.1 ml 31.72 ml/m  AORTIC VALVE AV Area (Vmax):    1.91 cm AV Area (Vmean):   1.91 cm AV Area (VTI):     1.91 cm AV Vmax:           125.00 cm/s AV Vmean:          83.000 cm/s AV VTI:            0.267 m AV Peak Grad:      6.2 mmHg AV Mean Grad:      3.0 mmHg LVOT Vmax:         105.00 cm/s LVOT Vmean:        69.800 cm/s LVOT VTI:          0.225 m LVOT/AV VTI ratio: 0.84  AORTA Ao Root diam: 2.70 cm MITRAL VALVE                TRICUSPID VALVE MV Area (PHT): 3.48 cm     TR Peak grad:   15.2 mmHg MV Decel Time: 218 msec     TR Vmax:        195.00 cm/s MV E velocity: 91.50 cm/s MV A velocity: 103.00 cm/s  SHUNTS MV E/A ratio:  0.89         Systemic VTI:  0.22 m  Systemic Diam: 1.70 cm Sanda Klein MD Electronically signed by Sanda Klein MD Signature Date/Time: 07/25/2021/4:06:38 PM    Final    MR BRAIN WO CONTRAST  Result Date: 07/24/2021 CLINICAL DATA:  Initial evaluation for neuro deficit, stroke suspected. EXAM: MRI HEAD WITHOUT CONTRAST TECHNIQUE: Multiplanar, multiecho pulse sequences of the brain and surrounding structures were obtained without intravenous contrast. COMPARISON:  Prior CT from 07/23/2021. FINDINGS: Brain: Cerebral volume within normal limits for age. Patchy and confluent T2/FLAIR hyperintensity involving the periventricular deep white matter both cerebral hemispheres, most consistent with chronic small vessel ischemic disease, mild-to-moderate in nature. 12 mm acute ischemic nonhemorrhagic perforator type infarcts seen involving the posterior left basal ganglia (series 2, image 32). No associated mass effect. No other evidence for acute or subacute ischemia. Gray-white matter  differentiation otherwise maintained. No other areas of chronic cortical infarction. No acute intracranial hemorrhage. Single punctate chronic microhemorrhage noted at the parasagittal left frontoparietal region, of doubtful significance in isolation. No mass lesion, midline shift or mass effect. No hydrocephalus or extra-axial fluid collection. Pituitary gland suprasellar region within normal limits. Vascular: Major intracranial vascular flow voids are maintained. Skull and upper cervical spine: Craniocervical junction within normal limits. Bone marrow signal intensity normal. No scalp soft tissue abnormality. Sinuses/Orbits: Prior bilateral ocular lens replacement. Mild scattered mucosal thickening noted about the ethmoidal air cells and maxillary sinuses. No significant mastoid effusion. Other: None. IMPRESSION: 1. 12 mm acute ischemic nonhemorrhagic posterior left basal ganglia infarct. 2. Underlying mild to moderate chronic microvascular ischemic disease. Electronically Signed   By: Jeannine Boga M.D.   On: 07/24/2021 23:10   CT ANGIO HEAD NECK W WO CM  Result Date: 07/23/2021 CLINICAL DATA:  Right-sided weakness and numbness EXAM: CT ANGIOGRAPHY HEAD AND NECK TECHNIQUE: Multidetector CT imaging of the head and neck was performed using the standard protocol during bolus administration of intravenous contrast. Multiplanar CT image reconstructions and MIPs were obtained to evaluate the vascular anatomy. Carotid stenosis measurements (when applicable) are obtained utilizing NASCET criteria, using the distal internal carotid diameter as the denominator. RADIATION DOSE REDUCTION: This exam was performed according to the departmental dose-optimization program which includes automated exposure control, adjustment of the mA and/or kV according to patient size and/or use of iterative reconstruction technique. CONTRAST:  160m OMNIPAQUE IOHEXOL 350 MG/ML SOLN COMPARISON:  None Available. FINDINGS: CT HEAD  FINDINGS Brain: There is no mass, hemorrhage or extra-axial collection. The size and configuration of the ventricles and extra-axial CSF spaces are normal. There is no acute or chronic infarction. The brain parenchyma is normal. Skull: The visualized skull base, calvarium and extracranial soft tissues are normal. Sinuses/Orbits: No fluid levels or advanced mucosal thickening of the visualized paranasal sinuses. No mastoid or middle ear effusion. The orbits are normal. CTA NECK FINDINGS SKELETON: There is no bony spinal canal stenosis. No lytic or blastic lesion. OTHER NECK: Normal pharynx, larynx and major salivary glands. No cervical lymphadenopathy. Unremarkable thyroid gland. UPPER CHEST: No pneumothorax or pleural effusion. No nodules or masses. AORTIC ARCH: There is no calcific atherosclerosis of the aortic arch. There is no aneurysm, dissection or hemodynamically significant stenosis of the visualized portion of the aorta. Conventional 3 vessel aortic branching pattern. The visualized proximal subclavian arteries are widely patent. RIGHT CAROTID SYSTEM: Normal without aneurysm, dissection or stenosis. LEFT CAROTID SYSTEM: Normal without aneurysm, dissection or stenosis. VERTEBRAL ARTERIES: Left dominant configuration. Both origins are clearly patent. There is no dissection, occlusion or flow-limiting stenosis to the skull base (V1-V3 segments).  CTA HEAD FINDINGS POSTERIOR CIRCULATION: --Vertebral arteries: Normal V4 segments. --Inferior cerebellar arteries: Normal. --Basilar artery: Normal. --Superior cerebellar arteries: Normal. --Posterior cerebral arteries (PCA): Normal. ANTERIOR CIRCULATION: --Intracranial internal carotid arteries: Normal. --Anterior cerebral arteries (ACA): Normal. Both A1 segments are present. Patent anterior communicating artery (a-comm). --Middle cerebral arteries (MCA): Normal. VENOUS SINUSES: As permitted by contrast timing, patent. ANATOMIC VARIANTS: None Review of the MIP images  confirms the above findings. IMPRESSION: Normal CTA of the head and neck. Electronically Signed   By: Ulyses Jarred M.D.   On: 07/23/2021 22:24   (Echo, Carotid, EGD, Colonoscopy, ERCP)    Subjective: Seen and examined multiple times today.  Husband at the bedside.  No more recurrent events.  Getting up and walking around, she does have some weakness on the right side at baseline.   Discharge Exam: Vitals:   08/18/21 1100 08/18/21 1111  BP:  138/73  Pulse:  71  Resp: 15 17  Temp:  97.9 F (36.6 C)  SpO2:  98%   Vitals:   08/18/21 0900 08/18/21 1000 08/18/21 1100 08/18/21 1111  BP:    138/73  Pulse:    71  Resp: 16 (!) '21 15 17  '$ Temp:    97.9 F (36.6 C)  TempSrc:    Oral  SpO2:    98%  Weight:        General: Pt is alert, awake, not in acute distress Cardiovascular: RRR, S1/S2 +, no rubs, no gallops Respiratory: CTA bilaterally, no wheezing, no rhonchi Abdominal: Soft, NT, ND, bowel sounds + Extremities: no edema, no cyanosis Right ankle with chronic deformity. Right upper and lower extremity with mild weakness.    The results of significant diagnostics from this hospitalization (including imaging, microbiology, ancillary and laboratory) are listed below for reference.     Microbiology: No results found for this or any previous visit (from the past 240 hour(s)).   Labs: BNP (last 3 results) No results for input(s): "BNP" in the last 8760 hours. Basic Metabolic Panel: Recent Labs  Lab 08/16/21 2231 08/17/21 1804 08/18/21 0554  NA 136  --  139  K 3.8  --  3.4*  CL 103  --  104  CO2 23  --  22  GLUCOSE 122*  --  106*  BUN 9  --  10  CREATININE 0.68  --  0.53  CALCIUM 9.3  --  9.2  MG  --  1.9  --    Liver Function Tests: No results for input(s): "AST", "ALT", "ALKPHOS", "BILITOT", "PROT", "ALBUMIN" in the last 168 hours. No results for input(s): "LIPASE", "AMYLASE" in the last 168 hours. No results for input(s): "AMMONIA" in the last 168  hours. CBC: Recent Labs  Lab 08/16/21 2231 08/17/21 1804 08/18/21 0554  WBC 14.2* 11.3* 7.5  NEUTROABS 10.2* 6.6  --   HGB 13.1 12.4 11.8*  HCT 39.7 38.0 36.8  MCV 91.3 91.1 91.8  PLT 366 326 307   Cardiac Enzymes: No results for input(s): "CKTOTAL", "CKMB", "CKMBINDEX", "TROPONINI" in the last 168 hours. BNP: Invalid input(s): "POCBNP" CBG: Recent Labs  Lab 08/16/21 2234 08/18/21 0514  GLUCAP 124* 106*   D-Dimer No results for input(s): "DDIMER" in the last 72 hours. Hgb A1c No results for input(s): "HGBA1C" in the last 72 hours. Lipid Profile No results for input(s): "CHOL", "HDL", "LDLCALC", "TRIG", "CHOLHDL", "LDLDIRECT" in the last 72 hours. Thyroid function studies Recent Labs    08/17/21 1804  TSH 0.404   Anemia work up  No results for input(s): "VITAMINB12", "FOLATE", "FERRITIN", "TIBC", "IRON", "RETICCTPCT" in the last 72 hours. Urinalysis    Component Value Date/Time   COLORURINE YELLOW 08/17/2021 1142   APPEARANCEUR HAZY (A) 08/17/2021 1142   LABSPEC 1.012 08/17/2021 1142   PHURINE 5.0 08/17/2021 1142   GLUCOSEU NEGATIVE 08/17/2021 1142   HGBUR SMALL (A) 08/17/2021 1142   BILIRUBINUR NEGATIVE 08/17/2021 1142   KETONESUR NEGATIVE 08/17/2021 1142   PROTEINUR NEGATIVE 08/17/2021 1142   NITRITE NEGATIVE 08/17/2021 1142   LEUKOCYTESUR TRACE (A) 08/17/2021 1142   Sepsis Labs Recent Labs  Lab 08/16/21 2231 08/17/21 1804 08/18/21 0554  WBC 14.2* 11.3* 7.5   Microbiology No results found for this or any previous visit (from the past 240 hour(s)).   Time coordinating discharge: 28 minutes  SIGNED:   Barb Merino, MD  [facility] 08/18/2021, 1:06 PM

## 2021-08-18 NOTE — Procedures (Signed)
Patient Name: Monica Hinton  MRN: [id number]  Epilepsy Attending: Lora Havens  Referring Physician/Provider: Orma Flaming, MD Date: 08/17/2021 Duration: 22.28 mins  Patient history: 76 year old female with syncope.  EEG evaluate for seizure.  Level of alertness: Awake, asleep  AEDs during EEG study: None  Technical aspects: This EEG study was done with scalp electrodes positioned according to the 10-20 International system of electrode placement. Electrical activity was reviewed with band pass filter of 1-'70Hz'$ , sensitivity of 7 uV/mm, display speed of 77m/sec with a '60Hz'$  notched filter applied as appropriate. EEG data were recorded continuously and digitally stored.  Video monitoring was available and reviewed as appropriate.  Description: The posterior dominant rhythm consists of 8 Hz activity of moderate voltage (25-35 uV) seen predominantly in posterior head regions, symmetric and reactive to eye opening and eye closing. Sleep was characterized by vertex waves, sleep spindles (12 to 14 Hz), maximal frontocentral region.  Single sharp transients was noted in the RIGHT temporal region.  Hyperventilation and photic stimulation were not performed.     IMPRESSION: This study is within normal limits. No seizures or epileptiform discharges were seen throughout the recording.  Monica Hinton OBarbra Sarks

## 2021-08-18 NOTE — TOC Transition Note (Signed)
Transition of Care [facility]) - CM/SW Discharge Note   Patient Details  Name: ALONZO LOVING MRN: [id number] Date of Birth: February 11, 1945  Transition of Care [facility]) CM/SW Contact:  Pollie Friar, RN Phone Number: 08/18/2021, 11:30 AM   Clinical Narrative:    Pt is from home with spouse. She has all needed DME. She was going to [facility] and will continue with them at d/c.  Pt has transportation home once d/ced.    Final next level of care: OP Rehab Barriers to Discharge: No Barriers Identified   Patient Goals and CMS Choice     Choice offered to / list presented to : Patient  Discharge Placement                       Discharge Plan and Services                                     Social Determinants of Health (SDOH) Interventions     Readmission Risk Interventions     No data to display

## 2021-08-22 ENCOUNTER — Ambulatory Visit: Payer: Medicare Other

## 2021-08-22 ENCOUNTER — Ambulatory Visit: Payer: Medicare Other | Admitting: Occupational Therapy

## 2021-08-22 ENCOUNTER — Encounter: Payer: Self-pay | Admitting: Occupational Therapy

## 2021-08-22 DIAGNOSIS — R262 Difficulty in walking, not elsewhere classified: Secondary | ICD-10-CM

## 2021-08-22 DIAGNOSIS — R2681 Unsteadiness on feet: Secondary | ICD-10-CM

## 2021-08-22 DIAGNOSIS — M6281 Muscle weakness (generalized): Secondary | ICD-10-CM | POA: Diagnosis not present

## 2021-08-22 DIAGNOSIS — R278 Other lack of coordination: Secondary | ICD-10-CM

## 2021-08-22 NOTE — Therapy (Signed)
OUTPATIENT PHYSICAL THERAPY NEURO TREATMENT   Patient Name: Monica Hinton MRN: [id number] DOB:July 09, 1945, 76 y.o., female Today's Date: 08/22/2021   PCP: Orpah Melter, MD REFERRING PROVIDER: Elodia Florence., MD    PT End of Session - 08/22/21 1059     Visit Number 4    Number of Visits 21    Date for PT Re-Evaluation 10/18/21    Authorization Type UHC medicare    Progress Note Due on Visit 10    PT Start Time 1105    PT Stop Time 1145    PT Time Calculation (min) 40 min    Equipment Utilized During Treatment Gait belt    Activity Tolerance Patient tolerated treatment well    Behavior During Therapy WFL for tasks assessed/performed;Anxious              Past Medical History:  Diagnosis Date   Allergic rhinitis    Asthma    seasonal    Bursitis    r hip    GERD (gastroesophageal reflux disease)    High cholesterol    Hypercholesteremia    Hypertension    Hypothyroidism    Hypothyroidism    Insomnia    Leukocytosis    Obesity    Peripheral vascular disease (HCC)    bilateral LE edema ; edma in hands as well    PONV (postoperative nausea and vomiting)    Thrombocytosis    Vitamin D deficiency    Past Surgical History:  Procedure Laterality Date   bladder      bladder tack  20 years ago    COLONOSCOPY     EYE SURGERY Bilateral    catracts   hysterectomy      20 years    OPEN SURGICAL REPAIR OF GLUTEAL TENDON Right 09/19/2016   Procedure: Right hip bursectomy and gluteal tendon repair;  Surgeon: Gaynelle Arabian, MD;  Location: [facility];  Service: Orthopedics;  Laterality: Right;   TONSILLECTOMY     age 36    TOTAL ANKLE ARTHROPLASTY Right 04/27/2021   Procedure: TOTAL ANKLE ARTHROPLASTY;  Surgeon: Wylene Simmer, MD;  Location: [place];  Service: Orthopedics;  Laterality: Right;   Patient Active Problem List   Diagnosis Date Noted   Syncope 08/17/2021   Leukocytosis 08/17/2021   History of CVA (cerebrovascular accident)  08/17/2021   Hormone replacement therapy 07/25/2021   Essential hypertension 07/25/2021   Dyslipidemia 07/25/2021   Asthma, chronic 07/25/2021   Acute CVA (cerebrovascular accident) ([place]) 07/24/2021   Spondylolisthesis of lumbar region 09/10/2019   Tendinopathy of right gluteus medius 09/19/2016    ONSET DATE: 07/23/21  REFERRING DIAG: 63.9 (ICD-10-CM) - Acute CVA (cerebrovascular accident) ([place])   THERAPY DIAG:  Muscle weakness (generalized)  Unsteadiness on feet  Difficulty in walking, not elsewhere classified  Other lack of coordination  Rationale for Evaluation and Treatment Rehabilitation  SUBJECTIVE:  SUBJECTIVE STATEMENT: Patient reports that she's just starting to feel better now since hospitalization. States she feels as though she's regressed in her walking.    Pt accompanied by: significant other [name]  PERTINENT HISTORY: HTN,HLD,hypothyroidism,asthma PVD chronic b/l lower extremity edema, recent ankle replacement ~04/2021  PAIN:  Are you having pain? No  PRECAUTIONS: Fall  WEIGHT BEARING RESTRICTIONS No   TODAY'S TREATMENT:  Theract:   [facility] PT Assessment - 08/22/21 0001       Standardized Balance Assessment   10 Meter Walk .59ms      Functional Gait  Assessment   Gait assessed  Yes    Gait Level Surface Walks 20 ft in less than 7 sec but greater than 5.5 sec, uses assistive device, slower speed, mild gait deviations, or deviates 6-10 in outside of the 12 in walkway width.    Change in Gait Speed Able to change speed, demonstrates mild gait deviations, deviates 6-10 in outside of the 12 in walkway width, or no gait deviations, unable to achieve a major change in velocity, or uses a change in velocity, or uses an assistive device.    Gait with Horizontal Head Turns  Performs head turns with moderate changes in gait velocity, slows down, deviates 10-15 in outside 12 in walkway width but recovers, can continue to walk.    Gait with Vertical Head Turns Performs task with moderate change in gait velocity, slows down, deviates 10-15 in outside 12 in walkway width but recovers, can continue to walk.    Gait and Pivot Turn Turns slowly, requires verbal cueing, or requires several small steps to catch balance following turn and stop    Step Over Obstacle Is able to step over one shoe box (4.5 in total height) but must slow down and adjust steps to clear box safely. May require verbal cueing.    Gait with Narrow Base of Support Ambulates less than 4 steps heel to toe or cannot perform without assistance.    Gait with Eyes Closed Walks 20 ft, slow speed, abnormal gait pattern, evidence for imbalance, deviates 10-15 in outside 12 in walkway width. Requires more than 9 sec to ambulate 20 ft.    Ambulating Backwards Walks 20 ft, slow speed, abnormal gait pattern, evidence for imbalance, deviates 10-15 in outside 12 in walkway width.    Steps Two feet to a stair, must use rail.    Total Score 11           L LE toe taps to 4" box with R UE support only- emphasis on full weight shift R and improved R hip stability in stance  Manual:  Grade I/II R ankle AP mobs into dorsiflexion   Gait: -multiple laps in // bars with emphasis on longer L step length, engagement of R hip stabilizers, decreasing reliance on  BUE   PATIENT EDUCATION: Education details: HEP, PT prognosis  Person educated: Patient and Spouse Education method: Explanation, Demonstration, and Handouts Education comprehension: verbalized understanding   HOME EXERCISE PROGRAM: Access Code: [email or web address] URL: [email or web address] Date: 08/11/2021 Prepared by: TExcell Seltzer Exercises - Heel Raises with Counter Support  - 1 x daily - 7 x weekly - 3 sets - 10 reps - Long Sitting Ankle  Plantar Flexion with Resistance  - 1 x daily - 7 x weekly - 3 sets - 10 reps - Long Sitting Ankle Dorsiflexion with Anchored Resistance  - 1 x daily - 7 x weekly - 3 sets - 10 reps - Alternating Step  Taps with Counter Support  - 1 x daily - 7 x weekly - 3 sets - 10 reps - Seated Ankle Alphabet  - 1 x daily - 7 x weekly - 3 sets - 10 reps - Ankle Circles on Wobble Board in Sitting  - 1 x daily - 7 x weekly - 3 sets - 10 reps  GOALS: Goals reviewed with patient? Yes  SHORT TERM GOALS: Target date: 09/13/2021  Pt will be independent with initial HEP for improved balance and gait mechanics  Baseline: to be provided Goal status: INITIAL  2.  Pt will improve FGA to >/= 12/30 to demonstrate improved balance and reduced fall risk  Baseline: 7/30 Goal status: INITIAL  3.  Pt will improve gait speed to >/= 1.63ms to demonstrate improved community ambulation  Baseline: .238m with rollator  Goal status: REVISED   LONG TERM GOALS: Target date: 10/18/2021  Pt will be independent with final HEP for improved balance and gait mechanics   Baseline: to be provided Goal status: INITIAL  2.  Pt will improve FGA to >/= 17/30 to demonstrate improved balance and reduced fall risk  Baseline: 7/30 Goal status: INITIAL  3.  Pt will improve gait speed to >/= 1.26m28mto demonstrate improved community ambulation  Baseline: .82m53mith rollator  Goal status: REVISED  ASSESSMENT:  CLINICAL IMPRESSION: Patient seen for skilled PT session with emphasis on basic goal reassessment since recent hospitalization and improving overall gait mechanics. She continues to ambulate with shortened L step length and significant R trendelenburg. Patient also continues with fear of falling when ambulating without an AD further exacerbating the above mentioned gait deficits. PT achieving full functional DF in R ankle with carryover to gait only with consistent cuing. 10 Meter Walk Test: Patient instructed to walk 10 meters  (32.8 ft) as quickly and as safely as possible at their normal speed x2 and at a fast speed x2. Time measured from 2 meter mark to 8 meter mark to accommodate ramp-up and ramp-down.  Normal speed: .68m/s76mut off scores: <0.4 m/s = household Ambulator, 0.4-0.8 m/s = limited community Ambulator, >0.8 m/s = community Ambulator, >1.2 m/s = crossing a street, <1.0 = increased fall risk MCID 0.05 m/s (small), 0.13 m/s (moderate), 0.06 m/s (significant)  (ANPTA Core Set of Outcome Measures for Adults with Neurologic Conditions, 2018). Patient demonstrates increased fall risk as noted by score of 11/30 on  Functional Gait Assessment.   <22/30 = predictive of falls, <20/30 = fall in 6 months, <18/30 = predictive of falls in PD MCID: 5 points stroke population, 4 points geriatric population (ANPTA Core Set of Outcome Measures for Adults with Neurologic Conditions, 2018). Continue POC.    OBJECTIVE IMPAIRMENTS Abnormal gait, decreased activity tolerance, decreased balance, decreased endurance, decreased knowledge of condition, decreased knowledge of use of DME, difficulty walking, and decreased strength.   ACTIVITY LIMITATIONS carrying, lifting, bending, standing, squatting, stairs, and locomotion level  PARTICIPATION LIMITATIONS: meal prep, cleaning, laundry, driving, shopping, and community activity  PERSONAL FACTORS Age, Fitness, Past/current experiences, and 3+ comorbidities: HTN HLD, PVD, LE edema, recent R ankle replacement   are also affecting patient's functional outcome.   REHAB POTENTIAL: Good  CLINICAL DECISION MAKING: Stable/uncomplicated  EVALUATION COMPLEXITY: Low  PLAN: PT FREQUENCY: 2x/week  PT DURATION: 10 weeks  PLANNED INTERVENTIONS: Therapeutic exercises, Therapeutic activity, Neuromuscular re-education, Balance training, Gait training, Patient/Family education, Self Care, Joint mobilization, Joint manipulation, Stair training, Vestibular training, Visual/preceptual  remediation/compensation, Orthotic/Fit training, DME instructions, Aquatic  Therapy, Dry Needling, Cognitive remediation, Electrical stimulation, Spinal manipulation, Spinal mobilization, Cryotherapy, Moist heat, Manual lymph drainage, scar mobilization, Taping, Manual therapy, and Re-evaluation  PLAN FOR NEXT SESSION: continue to work on balance, gait mechanics, R ankle strengthening, eccentric step-downs, increasing stance time on RLE, R hip strengthening   Debbora Dus, PT, DPT Debbora Dus, PT, DPT, CBIS  08/22/2021, 12:09 PM

## 2021-08-22 NOTE — Therapy (Signed)
OUTPATIENT OCCUPATIONAL THERAPY NEURO TREATMENT  Patient Name: Monica Hinton MRN: [id number] DOB:03/03/1945, 76 y.o., female Today's Date: 08/22/2021  PCP: Orpah Melter, MD  REFERRING PROVIDER: PCP: Orpah Melter, MD    OT End of Session - 08/22/21 1020     Visit Number 3    Number of Visits 8    Date for OT Re-Evaluation 09/09/21    Authorization Type UHC MCR    OT Start Time 1017    OT Stop Time 1100    OT Time Calculation (min) 43 min    Activity Tolerance Patient tolerated treatment well    Behavior During Therapy WFL for tasks assessed/performed             Past Medical History:  Diagnosis Date   Allergic rhinitis    Asthma    seasonal    Bursitis    r hip    GERD (gastroesophageal reflux disease)    High cholesterol    Hypercholesteremia    Hypertension    Hypothyroidism    Hypothyroidism    Insomnia    Leukocytosis    Obesity    Peripheral vascular disease ([place])    bilateral LE edema ; edma in hands as well    PONV (postoperative nausea and vomiting)    Thrombocytosis    Vitamin D deficiency    Past Surgical History:  Procedure Laterality Date   bladder      bladder tack  20 years ago    COLONOSCOPY     EYE SURGERY Bilateral    catracts   hysterectomy      20 years    OPEN SURGICAL REPAIR OF GLUTEAL TENDON Right 09/19/2016   Procedure: Right hip bursectomy and gluteal tendon repair;  Surgeon: Gaynelle Arabian, MD;  Location: [facility];  Service: Orthopedics;  Laterality: Right;   TONSILLECTOMY     age 20    TOTAL ANKLE ARTHROPLASTY Right 04/27/2021   Procedure: TOTAL ANKLE ARTHROPLASTY;  Surgeon: Wylene Simmer, MD;  Location: [place];  Service: Orthopedics;  Laterality: Right;   Patient Active Problem List   Diagnosis Date Noted   Syncope 08/17/2021   Leukocytosis 08/17/2021   History of CVA (cerebrovascular accident) 08/17/2021   Hormone replacement therapy 07/25/2021   Essential hypertension 07/25/2021   Dyslipidemia  07/25/2021   Asthma, chronic 07/25/2021   Acute CVA (cerebrovascular accident) ([place]) 07/24/2021   Spondylolisthesis of lumbar region 09/10/2019   Tendinopathy of right gluteus medius 09/19/2016    ONSET DATE: 07/23/2021   REFERRING DIAG: I63.9 (ICD-10-CM) - Acute CVA (cerebrovascular accident) ([place])   MRI with 12 mm acute ischemic nonhemorrhagic posterior L basal ganglia infarct   THERAPY DIAG:  Muscle weakness (generalized)  Other lack of coordination  Rationale for Evaluation and Treatment Rehabilitation  SUBJECTIVE:   SUBJECTIVE STATEMENT: Laying on my stomach is not an option. I don't remember the chair push ups Pt accompanied by: significant other  PERTINENT HISTORY: medical history significant of  HTN,HLD,hypothyroidism,asthma PVD chronic b/l lower extremity edema, L4-5 fusion 2021, Rt ankle replacement 04/27/21  PRECAUTIONS: Fall  WEIGHT BEARING RESTRICTIONS No  PAIN:  Are you having pain? No  PLOF: Independent, retired, has not driven past year due to ankle, ballroom dancing  PATIENT GOALS get back to 100%  OBJECTIVE:   HAND DOMINANCE: Right  TODAY'S TREATMENT  Reviewed and formally issued HEP for scapula depression/stabilization, and correct scapulohumeral rhythm with reaching. Pt returned demo of chair push ups and modified prone ex (standing over  counter) for shoulder extension. Verbally reviewed ex at wall and in supine.  Pt reports she was already doing putty and coordination HEP but could not remember all for the shoulder/scapula  Pt politely refused to get prone (even w/ pillow under stomach) due to previous back surgery and difficulty getting in/out position.   Simulated cooking task and practiced getting pot from low cabinet and things in/out of refrigerator w/ mod cueing for safety and proper walker placement to minimize fall risk. Also discussed options for transporting laundry for increased safety  Pt has met LTG #2  PATIENT  EDUCATION: Education details: Review of HEP, walker safety and kitchen negotiation Person educated: Patient and Spouse Education method: Explanation, Demonstration, Verbal cues, and Handouts Education comprehension: verbalized understanding, returned demonstration, verbal cues required, and needs further education   HOME EXERCISE PROGRAM: 08/15/21: coordination and putty HEP for Rt hand 08/22/21: HEP for scapula/sh girdle   GOALS: Goals reviewed with patient? Yes  LONG TERM GOALS: Target date: 09/09/21  Independent w/ HEP for Rt shoulder ROM/strengthening, grip strengthening, and high level coordination HEP  Baseline:  Goal status: IN PROGRESS  2.  Pt to simulate cooking task and laundry tasks safely using rollator Baseline:  Goal status: MET  3.  Pt to demo full Rt shoulder flexion for reaching into higher cabinets Baseline:  Goal status: IN PROGRESS  4.  Improve Rt hand coordination as evidenced by performing 9 hole peg test in 24 sec or less Baseline: 28.72 sec Goal status: IN PROGRESS  ASSESSMENT:  CLINICAL IMPRESSION: Patient met LTG #2 and progressing towards other goals  PERFORMANCE DEFICITS in functional skills including IADLs, coordination, dexterity, ROM, strength, mobility, balance, body mechanics, decreased knowledge of use of DME, and UE functional use,.   IMPAIRMENTS are limiting patient from IADLs, leisure, and social participation.   COMORBIDITIES may have co-morbidities  that affects occupational performance. Patient will benefit from skilled OT to address above impairments and improve overall function.  MODIFICATION OR ASSISTANCE TO COMPLETE EVALUATION: No modification of tasks or assist necessary to complete an evaluation.  OT OCCUPATIONAL PROFILE AND HISTORY: Problem focused assessment: Including review of records relating to presenting problem.  CLINICAL DECISION MAKING: Moderate - several treatment options, min-mod task modification necessary  REHAB  POTENTIAL: Good  EVALUATION COMPLEXITY: Low    PLAN: OT FREQUENCY: 2x/week  OT DURATION: 4 weeks (Anticipate only 4 sessions needed)   PLANNED INTERVENTIONS: self care/ADL training, therapeutic exercise, therapeutic activity, neuromuscular re-education, functional mobility training, patient/family education, coping strategies training, and DME and/or AE instructions  RECOMMENDED OTHER SERVICES: none  CONSULTED AND AGREED WITH PLAN OF CARE: Patient and family member/caregiver  PLAN FOR NEXT SESSION: practice shoulder flexion prone w/ normal scapula movement, then add light weight if possible, begin working on mid to high level reaching while seated w/ normal scapula movement, check pt's schedule - ? Whether to d/c next session or if pt was able to get 2 more scheduled   Hans Eden, OT 08/22/2021, 10:28 AM

## 2021-08-24 ENCOUNTER — Ambulatory Visit: Payer: Medicare Other | Admitting: Occupational Therapy

## 2021-08-24 ENCOUNTER — Encounter: Payer: Self-pay | Admitting: Physical Therapy

## 2021-08-24 ENCOUNTER — Ambulatory Visit: Payer: Medicare Other | Admitting: Physical Therapy

## 2021-08-24 DIAGNOSIS — M6281 Muscle weakness (generalized): Secondary | ICD-10-CM | POA: Diagnosis not present

## 2021-08-24 DIAGNOSIS — R278 Other lack of coordination: Secondary | ICD-10-CM | POA: Diagnosis not present

## 2021-08-24 DIAGNOSIS — R262 Difficulty in walking, not elsewhere classified: Secondary | ICD-10-CM

## 2021-08-24 DIAGNOSIS — R2681 Unsteadiness on feet: Secondary | ICD-10-CM

## 2021-08-24 NOTE — Therapy (Signed)
OUTPATIENT OCCUPATIONAL THERAPY NEURO TREATMENT  Patient Name: Monica Hinton MRN: [id number] DOB:[birth year], 76 y.o., female Today's Date: 08/24/2021  PCP: Orpah Melter, MD  REFERRING PROVIDER: PCP: Orpah Melter, MD    OT End of Session - 08/24/21 1106     Visit Number 4    Number of Visits 8    Date for OT Re-Evaluation 10/12/21    Authorization Type UHC MCR    OT Start Time 1105    OT Stop Time 5003    OT Time Calculation (min) 40 min    Activity Tolerance Patient tolerated treatment well    Behavior During Therapy WFL for tasks assessed/performed             Past Medical History:  Diagnosis Date   Allergic rhinitis    Asthma    seasonal    Bursitis    r hip    GERD (gastroesophageal reflux disease)    High cholesterol    Hypercholesteremia    Hypertension    Hypothyroidism    Hypothyroidism    Insomnia    Leukocytosis    Obesity    Peripheral vascular disease ([place])    bilateral LE edema ; edma in hands as well    PONV (postoperative nausea and vomiting)    Thrombocytosis    Vitamin D deficiency    Past Surgical History:  Procedure Laterality Date   bladder      bladder tack  20 years ago    COLONOSCOPY     EYE SURGERY Bilateral    catracts   hysterectomy      20 years    OPEN SURGICAL REPAIR OF GLUTEAL TENDON Right 09/19/2016   Procedure: Right hip bursectomy and gluteal tendon repair;  Surgeon: Gaynelle Arabian, MD;  Location: [facility];  Service: Orthopedics;  Laterality: Right;   TONSILLECTOMY     age 59    TOTAL ANKLE ARTHROPLASTY Right 04/27/2021   Procedure: TOTAL ANKLE ARTHROPLASTY;  Surgeon: Wylene Simmer, MD;  Location: [place];  Service: Orthopedics;  Laterality: Right;   Patient Active Problem List   Diagnosis Date Noted   Syncope 08/17/2021   Leukocytosis 08/17/2021   History of CVA (cerebrovascular accident) 08/17/2021   Hormone replacement therapy 07/25/2021   Essential hypertension 07/25/2021   Dyslipidemia  07/25/2021   Asthma, chronic 07/25/2021   Acute CVA (cerebrovascular accident) ([place]) 07/24/2021   Spondylolisthesis of lumbar region 09/10/2019   Tendinopathy of right gluteus medius 09/19/2016    ONSET DATE: 07/23/2021   REFERRING DIAG: I63.9 (ICD-10-CM) - Acute CVA (cerebrovascular accident) ([place])   MRI with 12 mm acute ischemic nonhemorrhagic posterior L basal ganglia infarct   THERAPY DIAG:  Muscle weakness (generalized) - Plan: Ot plan of care cert/re-cert  Unsteadiness on feet - Plan: Ot plan of care cert/re-cert  Other lack of coordination - Plan: Ot plan of care cert/re-cert  Rationale for Evaluation and Treatment Rehabilitation  SUBJECTIVE:   SUBJECTIVE STATEMENT: I was able to get a few more appointments scheduled Pt accompanied by: significant other  PERTINENT HISTORY: medical history significant of  HTN,HLD,hypothyroidism,asthma PVD chronic b/l lower extremity edema, L4-5 fusion 2021, Rt ankle replacement 04/27/21  PRECAUTIONS: Fall  WEIGHT BEARING RESTRICTIONS No  PAIN:  Are you having pain? No  PLOF: Independent, retired, has not driven past year due to ankle, ballroom dancing  PATIENT GOALS get back to 100%  OBJECTIVE:   HAND DOMINANCE: Right  TODAY'S TREATMENT  Worked supine for sh flex/ext w/ normal  scapulohumeral rhythm requiring tactile cues/facilitation and cues for Rt scapula depression and elbow extension - modified to chest press motion with 1 lb weight - pt able to do easier and w/ less popping in Rt shoulder (premorbid). 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  Also worked on elbow extension against gravity in supine w/ 1 lb weight.   Issued theraband ex's for scapula retraction and shoulder extension w/ yellow resistance. Pt able to do well and told she can d/c modified sh extension ex standing over counter.   UBE x 8 min, level 2 resistance     HOME  EXERCISE PROGRAM: 08/15/21: coordination and putty HEP for Rt hand 08/22/21: HEP for scapula/sh girdle 08/24/21: Theraband HEP   GOALS: Goals reviewed with patient? Yes  LONG TERM GOALS: Target date: 09/09/21  Independent w/ HEP for Rt shoulder ROM/strengthening, grip strengthening, and high level coordination HEP  Baseline:  Goal status: MET  2.  Pt to simulate cooking task and laundry tasks safely using rollator Baseline:  Goal status: MET  3.  Pt to demo Rt shoulder flexion sufficient for reaching into higher cabinets for light objects Baseline:  Goal status: REVISED  4.  Improve Rt hand coordination as evidenced by performing 9 hole peg test in 24 sec or less Baseline: 28.72 sec Goal status: IN PROGRESS  ASSESSMENT:  CLINICAL IMPRESSION: Patient has met 2/4 LTG's at this time. Renewal completed today as pt was unable to meet frequency in original POC d/c scheduling conflicts  PERFORMANCE DEFICITS in functional skills including IADLs, coordination, dexterity, ROM, strength, mobility, balance, body mechanics, decreased knowledge of use of DME, and UE functional use,.   IMPAIRMENTS are limiting patient from IADLs, leisure, and social participation.   COMORBIDITIES may have co-morbidities  that affects occupational performance. Patient will benefit from skilled OT to address above impairments and improve overall function.  MODIFICATION OR ASSISTANCE TO COMPLETE EVALUATION: No modification of tasks or assist necessary to complete an evaluation.  OT OCCUPATIONAL PROFILE AND HISTORY: Problem focused assessment: Including review of records relating to presenting problem.  CLINICAL DECISION MAKING: Moderate - several treatment options, min-mod task modification necessary  REHAB POTENTIAL: Good  EVALUATION COMPLEXITY: Low    PLAN: OT FREQUENCY: 2x/week  OT DURATION: 4 weeks (ADDITIONAL) prn beginning 09/11/21 - 10/12/21  PLANNED INTERVENTIONS: self care/ADL training,  therapeutic exercise, therapeutic activity, neuromuscular re-education, functional mobility training, patient/family education, coping strategies training, and DME and/or AE instructions  RECOMMENDED OTHER SERVICES: none  CONSULTED AND AGREED WITH PLAN OF CARE: Patient and family member/caregiver  PLAN FOR NEXT SESSION: continue to work on strengthening and reaching RUE w/ decreased compensations initiating movement w/ scapula depression, progress towards remaining 2 goals, d/c by end of September   Hans Eden, OT 08/24/2021, 12:03 PM

## 2021-08-24 NOTE — Therapy (Signed)
OUTPATIENT PHYSICAL THERAPY NEURO TREATMENT   Patient Name: Monica Hinton MRN: [id number] DOB:[birth year], 76 y.o., female Today's Date: 08/24/2021   PCP: Orpah Melter, MD REFERRING PROVIDER: Elodia Florence., MD    PT End of Session - 08/24/21 0932     Visit Number 5    Number of Visits 21    Date for PT Re-Evaluation 10/18/21    Authorization Type [employer] medicare    Progress Note Due on Visit 10    PT Start Time 0932    PT Stop Time 1013    PT Time Calculation (min) 41 min    Equipment Utilized During Treatment Gait belt    Activity Tolerance Patient tolerated treatment well    Behavior During Therapy [facility] for tasks assessed/performed;Anxious              Past Medical History:  Diagnosis Date   Allergic rhinitis    Asthma    seasonal    Bursitis    r hip    GERD (gastroesophageal reflux disease)    High cholesterol    Hypercholesteremia    Hypertension    Hypothyroidism    Hypothyroidism    Insomnia    Leukocytosis    Obesity    Peripheral vascular disease (HCC)    bilateral LE edema ; edma in hands as well    PONV (postoperative nausea and vomiting)    Thrombocytosis    Vitamin D deficiency    Past Surgical History:  Procedure Laterality Date   bladder      bladder tack  20 years ago    COLONOSCOPY     EYE SURGERY Bilateral    catracts   hysterectomy      20 years    OPEN SURGICAL REPAIR OF GLUTEAL TENDON Right 09/19/2016   Procedure: Right hip bursectomy and gluteal tendon repair;  Surgeon: Gaynelle Arabian, MD;  Location: [facility];  Service: Orthopedics;  Laterality: Right;   TONSILLECTOMY     age 89    TOTAL ANKLE ARTHROPLASTY Right 04/27/2021   Procedure: TOTAL ANKLE ARTHROPLASTY;  Surgeon: Wylene Simmer, MD;  Location: [place];  Service: Orthopedics;  Laterality: Right;   Patient Active Problem List   Diagnosis Date Noted   Syncope 08/17/2021   Leukocytosis 08/17/2021   History of CVA (cerebrovascular accident)  08/17/2021   Hormone replacement therapy 07/25/2021   Essential hypertension 07/25/2021   Dyslipidemia 07/25/2021   Asthma, chronic 07/25/2021   Acute CVA (cerebrovascular accident) ([place]) 07/24/2021   Spondylolisthesis of lumbar region 09/10/2019   Tendinopathy of right gluteus medius 09/19/2016    ONSET DATE: 07/23/21  REFERRING DIAG: 63.9 (ICD-10-CM) - Acute CVA (cerebrovascular accident) ([place])   THERAPY DIAG:  Muscle weakness (generalized)  Other lack of coordination  Unsteadiness on feet  Difficulty in walking, not elsewhere classified  Rationale for Evaluation and Treatment Rehabilitation  SUBJECTIVE:  SUBJECTIVE STATEMENT: She states she is feeling a little stronger today than last session.  Pt denies falls and near misses.   Pt accompanied by: significant other Monica Hinton  PERTINENT HISTORY: HTN,HLD,hypothyroidism,asthma PVD chronic b/l lower extremity edema, recent ankle replacement ~04/2021  PAIN:  Are you having pain? No  PRECAUTIONS: Fall  WEIGHT BEARING RESTRICTIONS No   TODAY'S TREATMENT:  NMR: -adv retreat using 2" progressed to 4" hurdle w/ unilateral UE support 2x15 w/ LLE in swing and x15 w/ RLE in swing, pt does well using LUE when LLE in swing due to postural compensation for functional right hip weakness -anteriorly oriented tilt board holding level x30 sec > tilting ant-post > laterally oriented board holding level x 30 sec > lateral tilts, extended time spent to allow pt to progress from BUE support to none as well as work on right ankle ROM and find point of balance w/ limited right ankle range -forward lunge/anterior weight shifting on 6" stair, CGA w/ LLE in stance, +BUE support when RLE in stance w/ cues to engage right quad for knee stability  THEREX: -6" step ups w/  BUE support x30 alt LE -6" step downs w/ minA and blocking of right knee on return to top stair x8 each LE  GAIT: Gait pattern: step through pattern, decreased stance time- Right, decreased stride length, and lateral lean- Left Distance walked: 286' Assistive device utilized: Environmental consultant - 4 wheeled Level of assistance: SBA Comments: Difficulty w/ toe off due to limited R ankle ROM forcing pt to intermittently lean left without overt LOB.  PATIENT EDUCATION: Education details: Continue HEP.   Discussed progressing with low resistance and shortened time on recumbent bike that pt has at home with supervision from spouse to ensure safety with transfer onto and off and good tolerance to activity in home setting (pt has completed bike in therapy prior). Person educated: Patient and Spouse Education method: Explanation, Demonstration, and Handouts Education comprehension: verbalized understanding   HOME EXERCISE PROGRAM: Access Code: [email or web address] URL: [email or web address] Date: 08/11/2021 Prepared by: Excell Seltzer  Exercises - Heel Raises with Counter Support  - 1 x daily - 7 x weekly - 3 sets - 10 reps - Long Sitting Ankle Plantar Flexion with Resistance  - 1 x daily - 7 x weekly - 3 sets - 10 reps - Long Sitting Ankle Dorsiflexion with Anchored Resistance  - 1 x daily - 7 x weekly - 3 sets - 10 reps - Alternating Step Taps with Counter Support  - 1 x daily - 7 x weekly - 3 sets - 10 reps - Seated Ankle Alphabet  - 1 x daily - 7 x weekly - 3 sets - 10 reps - Ankle Circles on Wobble Board in Sitting  - 1 x daily - 7 x weekly - 3 sets - 10 reps  GOALS: Goals reviewed with patient? Yes  SHORT TERM GOALS: Target date: 09/13/2021  Pt will be independent with initial HEP for improved balance and gait mechanics  Baseline: to be provided Goal status: INITIAL  2.  Pt will improve FGA to >/= 12/30 to demonstrate improved balance and reduced fall risk  Baseline: 7/30 Goal status:  INITIAL  3.  Pt will improve gait speed to >/= 1.5ms to demonstrate improved community ambulation  Baseline: .220m with rollator  Goal status: REVISED   LONG TERM GOALS: Target date: 10/18/2021  Pt will be independent with final HEP for improved balance and gait mechanics   Baseline: to be provided Goal  status: INITIAL  2.  Pt will improve FGA to >/= 17/30 to demonstrate improved balance and reduced fall risk  Baseline: 7/30 Goal status: INITIAL  3.  Pt will improve gait speed to >/= 1.2ms to demonstrate improved community ambulation  Baseline: .266m with rollator  Goal status: REVISED  ASSESSMENT:  CLINICAL IMPRESSION:  Pt tolerates session well today with focus on quad strengthening and progression of RLE weight-bearing and dynamic balance.  Focus somewhat on ankle strategy to help patient identify center of gravity with change in foot posturing due to recent ankle replacement as it pertains to ongoing balance challenges secondary to CVA.  She otherwise is progressing well towards LTGs and will continue to benefit from skilled PT intervention to further address gait training and other functional mobility.   OBJECTIVE IMPAIRMENTS Abnormal gait, decreased activity tolerance, decreased balance, decreased endurance, decreased knowledge of condition, decreased knowledge of use of DME, difficulty walking, and decreased strength.   ACTIVITY LIMITATIONS carrying, lifting, bending, standing, squatting, stairs, and locomotion level  PARTICIPATION LIMITATIONS: meal prep, cleaning, laundry, driving, shopping, and community activity  PERSONAL FACTORS Age, Fitness, Past/current experiences, and 3+ comorbidities: HTN HLD, PVD, LE edema, recent R ankle replacement   are also affecting patient's functional outcome.   REHAB POTENTIAL: Good  CLINICAL DECISION MAKING: Stable/uncomplicated  EVALUATION COMPLEXITY: Low  PLAN: PT FREQUENCY: 2x/week  PT DURATION: 10 weeks  PLANNED  INTERVENTIONS: Therapeutic exercises, Therapeutic activity, Neuromuscular re-education, Balance training, Gait training, Patient/Family education, Self Care, Joint mobilization, Joint manipulation, Stair training, Vestibular training, Visual/preceptual remediation/compensation, Orthotic/Fit training, DME instructions, Aquatic Therapy, Dry Needling, Cognitive remediation, Electrical stimulation, Spinal manipulation, Spinal mobilization, Cryotherapy, Moist heat, Manual lymph drainage, scar mobilization, Taping, Manual therapy, and Re-evaluation  PLAN FOR NEXT SESSION: continue to work on balance, gait mechanics, R ankle strengthening, eccentric step-downs, increasing stance time on RLE, R hip strengthening, 4" step ups-pt has risers for step at home and would like to know if she is safe to progress   MaBary RichardPT, DPT  08/24/2021, 5:35 PM

## 2021-08-24 NOTE — Patient Instructions (Signed)
ELBOW: Extension / Chest Press (Frame)    Lie on back with knees bent. Straighten elbows to raise frame or 1-2 lb weight. Hold _3__ seconds.  _10__ reps per set, _2__ sets per day   Scapular Retraction: Bilateral    Facing anchor, holding yellow theraband at ends, pull arms back, bringing shoulder blades together. Repeat _10___ times per set.  Do __2__ sessions per day, every other day.    Strengthening: Resisted Extension   Hold yellow theraband in __RT___ hand(s), arm forward. Pull arm back, elbow straight. Repeat _10___ times per set. Do _1-2___ sessions per day, every other day.

## 2021-08-29 ENCOUNTER — Ambulatory Visit: Payer: Medicare Other

## 2021-08-29 DIAGNOSIS — R278 Other lack of coordination: Secondary | ICD-10-CM | POA: Diagnosis not present

## 2021-08-29 DIAGNOSIS — R2681 Unsteadiness on feet: Secondary | ICD-10-CM

## 2021-08-29 DIAGNOSIS — M6281 Muscle weakness (generalized): Secondary | ICD-10-CM | POA: Diagnosis not present

## 2021-08-29 DIAGNOSIS — R262 Difficulty in walking, not elsewhere classified: Secondary | ICD-10-CM | POA: Diagnosis not present

## 2021-08-29 NOTE — Therapy (Signed)
OUTPATIENT PHYSICAL THERAPY NEURO TREATMENT   Patient Name: Monica Hinton MRN: [id number] DOB:Jan 19, 1945, 76 y.o., female Today's Date: 08/29/2021   PCP: Orpah Melter, MD REFERRING PROVIDER: Elodia Florence., MD    PT End of Session - 08/29/21 0935     Visit Number 6    Number of Visits 21    Date for PT Re-Evaluation 10/18/21    Authorization Type [employer] medicare    Progress Note Due on Visit 10    PT Start Time 0933    PT Stop Time 1020    PT Time Calculation (min) 47 min    Equipment Utilized During Treatment Gait belt    Activity Tolerance Patient tolerated treatment well    Behavior During Therapy WFL for tasks assessed/performed              Past Medical History:  Diagnosis Date   Allergic rhinitis    Asthma    seasonal    Bursitis    r hip    GERD (gastroesophageal reflux disease)    High cholesterol    Hypercholesteremia    Hypertension    Hypothyroidism    Hypothyroidism    Insomnia    Leukocytosis    Obesity    Peripheral vascular disease ([place])    bilateral LE edema ; edma in hands as well    PONV (postoperative nausea and vomiting)    Thrombocytosis    Vitamin D deficiency    Past Surgical History:  Procedure Laterality Date   bladder      bladder tack  20 years ago    COLONOSCOPY     EYE SURGERY Bilateral    catracts   hysterectomy      20 years    OPEN SURGICAL REPAIR OF GLUTEAL TENDON Right 09/19/2016   Procedure: Right hip bursectomy and gluteal tendon repair;  Surgeon: Gaynelle Arabian, MD;  Location: [facility];  Service: Orthopedics;  Laterality: Right;   TONSILLECTOMY     age 58    TOTAL ANKLE ARTHROPLASTY Right 04/27/2021   Procedure: TOTAL ANKLE ARTHROPLASTY;  Surgeon: Wylene Simmer, MD;  Location: [place];  Service: Orthopedics;  Laterality: Right;   Patient Active Problem List   Diagnosis Date Noted   Syncope 08/17/2021   Leukocytosis 08/17/2021   History of CVA (cerebrovascular accident) 08/17/2021    Hormone replacement therapy 07/25/2021   Essential hypertension 07/25/2021   Dyslipidemia 07/25/2021   Asthma, chronic 07/25/2021   Acute CVA (cerebrovascular accident) ([place]) 07/24/2021   Spondylolisthesis of lumbar region 09/10/2019   Tendinopathy of right gluteus medius 09/19/2016    ONSET DATE: 07/23/21  REFERRING DIAG: 63.9 (ICD-10-CM) - Acute CVA (cerebrovascular accident) ([place])   THERAPY DIAG:  Difficulty in walking, not elsewhere classified  Muscle weakness (generalized)  Other lack of coordination  Unsteadiness on feet  Rationale for Evaluation and Treatment Rehabilitation  SUBJECTIVE:  SUBJECTIVE STATEMENT: Patient reports doing well- going to a gym and walking well.   Pt accompanied by: significant other [name]  PERTINENT HISTORY: HTN,HLD,hypothyroidism,asthma PVD chronic b/l lower extremity edema, recent ankle replacement ~04/2021  PAIN:  Are you having pain? No  PRECAUTIONS: Fall  WEIGHT BEARING RESTRICTIONS No   TODAY'S TREATMENT:  GAIT: -41' x2 with SPC + CGA   NMR:  -yellow TB: hip abd + ext 2x12  -added with mirror for visual feedback on hip positioning  -Grade 1/2 ankle mob into DF -handout on scar mobilization  Self care:  -ext conversation on weaning off walker   PATIENT EDUCATION: Education details: scar mobilization, proper hip strengthening, continue HEP, weaning off of rollator  Person educated: Patient and Spouse Education method: Explanation, Demonstration, and Handouts Education comprehension: verbalized understanding   HOME EXERCISE PROGRAM: Access Code: [email or web address] URL: [email or web address] Date: 08/11/2021 Prepared by: Excell Seltzer  Exercises - Heel Raises with Counter Support  - 1 x daily - 7 x weekly - 3 sets - 10 reps -  Long Sitting Ankle Plantar Flexion with Resistance  - 1 x daily - 7 x weekly - 3 sets - 10 reps - Long Sitting Ankle Dorsiflexion with Anchored Resistance  - 1 x daily - 7 x weekly - 3 sets - 10 reps - Alternating Step Taps with Counter Support  - 1 x daily - 7 x weekly - 3 sets - 10 reps - Seated Ankle Alphabet  - 1 x daily - 7 x weekly - 3 sets - 10 reps - Ankle Circles on Wobble Board in Sitting  - 1 x daily - 7 x weekly - 3 sets - 10 reps  GOALS: Goals reviewed with patient? Yes  SHORT TERM GOALS: Target date: 09/13/2021  Pt will be independent with initial HEP for improved balance and gait mechanics  Baseline: to be provided Goal status: INITIAL  2.  Pt will improve FGA to >/= 12/30 to demonstrate improved balance and reduced fall risk  Baseline: 7/30 Goal status: INITIAL  3.  Pt will improve gait speed to >/= 1.19ms to demonstrate improved community ambulation  Baseline: .248m with rollator  Goal status: REVISED   LONG TERM GOALS: Target date: 10/18/2021  Pt will be independent with final HEP for improved balance and gait mechanics   Baseline: to be provided Goal status: INITIAL  2.  Pt will improve FGA to >/= 17/30 to demonstrate improved balance and reduced fall risk  Baseline: 7/30 Goal status: INITIAL  3.  Pt will improve gait speed to >/= 1.65m79mto demonstrate improved community ambulation  Baseline: .60m60mith rollator  Goal status: REVISED  ASSESSMENT:  CLINICAL IMPRESSION: Patient seen for skilled PT session with emphasis on R hip strengthening and gait retraining with SPC. Patient demonstrating improved confidence and fluidity of her gait when using rollator and SPC. Though she does continue to present with significant R trendelenburg due to lateral hip weakness. Patient requiring use of mirror to provide visual feedback on hip positioning during this exercise. Discussed at length with patient and spouse weaning off of rollator in the house first, but  making sure that she's able to maintain gait mechanics appropriately while doing so. Continue POC.    OBJECTIVE IMPAIRMENTS Abnormal gait, decreased activity tolerance, decreased balance, decreased endurance, decreased knowledge of condition, decreased knowledge of use of DME, difficulty walking, and decreased strength.   ACTIVITY LIMITATIONS carrying, lifting, bending, standing, squatting, stairs, and locomotion level  PARTICIPATION LIMITATIONS: meal prep, cleaning, laundry,  driving, shopping, and community activity  PERSONAL FACTORS Age, Fitness, Past/current experiences, and 3+ comorbidities: HTN HLD, PVD, LE edema, recent R ankle replacement   are also affecting patient's functional outcome.   REHAB POTENTIAL: Good  CLINICAL DECISION MAKING: Stable/uncomplicated  EVALUATION COMPLEXITY: Low  PLAN: PT FREQUENCY: 2x/week  PT DURATION: 10 weeks  PLANNED INTERVENTIONS: Therapeutic exercises, Therapeutic activity, Neuromuscular re-education, Balance training, Gait training, Patient/Family education, Self Care, Joint mobilization, Joint manipulation, Stair training, Vestibular training, Visual/preceptual remediation/compensation, Orthotic/Fit training, DME instructions, Aquatic Therapy, Dry Needling, Cognitive remediation, Electrical stimulation, Spinal manipulation, Spinal mobilization, Cryotherapy, Moist heat, Manual lymph drainage, scar mobilization, Taping, Manual therapy, and Re-evaluation  PLAN FOR NEXT SESSION: continue to work on balance, gait mechanics, R ankle strengthening, eccentric step-downs, increasing stance time on RLE, R hip strengthening   Debbora Dus, PT, DPT Debbora Dus, PT, DPT, CBIS   08/29/2021, 10:23 AM

## 2021-08-31 ENCOUNTER — Ambulatory Visit: Payer: Medicare Other | Admitting: Physical Therapy

## 2021-08-31 ENCOUNTER — Encounter: Payer: Self-pay | Admitting: Physical Therapy

## 2021-08-31 DIAGNOSIS — M6281 Muscle weakness (generalized): Secondary | ICD-10-CM

## 2021-08-31 DIAGNOSIS — R278 Other lack of coordination: Secondary | ICD-10-CM

## 2021-08-31 DIAGNOSIS — R2681 Unsteadiness on feet: Secondary | ICD-10-CM | POA: Diagnosis not present

## 2021-08-31 DIAGNOSIS — R262 Difficulty in walking, not elsewhere classified: Secondary | ICD-10-CM

## 2021-08-31 NOTE — Patient Instructions (Signed)
Access Code: [email or web address] URL: [email or web address] Date: 08/31/2021 Prepared by: [place] with Counter Support  - 1 x daily - 7 x weekly - 3 sets - 10 reps - Long Sitting Ankle Plantar Flexion with Resistance  - 1 x daily - 7 x weekly - 3 sets - 10 reps - Long Sitting Ankle Dorsiflexion with Anchored Resistance  - 1 x daily - 7 x weekly - 3 sets - 10 reps - Alternating Step Taps with Counter Support  - 1 x daily - 7 x weekly - 3 sets - 10 reps - Seated Ankle Alphabet  - 1 x daily - 7 x weekly - 3 sets - 10 reps - Ankle Circles on Wobble Board in Sitting  - 1 x daily - 7 x weekly - 3 sets - 10 reps - Seated Calf Raise on Step  - 1 x daily - 7 x weekly - 3 sets - 10 reps countertop pt practices weight-shift in stride position alt leading foot

## 2021-08-31 NOTE — Therapy (Signed)
OUTPATIENT PHYSICAL THERAPY NEURO TREATMENT   Patient Name: Monica Hinton MRN: [id number] DOB:[birth year], 76 y.o., female Today's Date: 08/31/2021   PCP: Orpah Melter, MD REFERRING PROVIDER: Elodia Florence., MD    PT End of Session - 08/31/21 [phone]     Visit Number 7    Number of Visits 21    Date for PT Re-Evaluation 10/18/21    Authorization Type [employer] medicare    Progress Note Due on Visit 10    PT Start Time 0932    PT Stop Time 1016    PT Time Calculation (min) 44 min    Equipment Utilized During Treatment Gait belt    Activity Tolerance Patient tolerated treatment well    Behavior During Therapy WFL for tasks assessed/performed              Past Medical History:  Diagnosis Date   Allergic rhinitis    Asthma    seasonal    Bursitis    r hip    GERD (gastroesophageal reflux disease)    High cholesterol    Hypercholesteremia    Hypertension    Hypothyroidism    Hypothyroidism    Insomnia    Leukocytosis    Obesity    Peripheral vascular disease ([place])    bilateral LE edema ; edma in hands as well    PONV (postoperative nausea and vomiting)    Thrombocytosis    Vitamin D deficiency    Past Surgical History:  Procedure Laterality Date   bladder      bladder tack  20 years ago    COLONOSCOPY     EYE SURGERY Bilateral    catracts   hysterectomy      20 years    OPEN SURGICAL REPAIR OF GLUTEAL TENDON Right 09/19/2016   Procedure: Right hip bursectomy and gluteal tendon repair;  Surgeon: Gaynelle Arabian, MD;  Location: [facility];  Service: Orthopedics;  Laterality: Right;   TONSILLECTOMY     age 41    TOTAL ANKLE ARTHROPLASTY Right 04/27/2021   Procedure: TOTAL ANKLE ARTHROPLASTY;  Surgeon: Wylene Simmer, MD;  Location: [facility];  Service: Orthopedics;  Laterality: Right;   Patient Active Problem List   Diagnosis Date Noted   Syncope 08/17/2021   Leukocytosis 08/17/2021   History of CVA (cerebrovascular accident) 08/17/2021    Hormone replacement therapy 07/25/2021   Essential hypertension 07/25/2021   Dyslipidemia 07/25/2021   Asthma, chronic 07/25/2021   Acute CVA (cerebrovascular accident) ([place]) 07/24/2021   Spondylolisthesis of lumbar region 09/10/2019   Tendinopathy of right gluteus medius 09/19/2016    ONSET DATE: 07/23/21  REFERRING DIAG: 63.9 (ICD-10-CM) - Acute CVA (cerebrovascular accident) ([place])   THERAPY DIAG:  Difficulty in walking, not elsewhere classified  Muscle weakness (generalized)  Other lack of coordination  Unsteadiness on feet  Rationale for Evaluation and Treatment Rehabilitation  SUBJECTIVE:  SUBJECTIVE STATEMENT: Pt denies falls or near misses.  She states she walks with the "walking stick" [facility]) in the mornings instead of the rollator.  Her and spouse went to gym yesterday and walked about 0.25 miles.    Pt accompanied by: significant other [name]  PERTINENT HISTORY: HTN,HLD,hypothyroidism,asthma PVD chronic b/l lower extremity edema, recent ankle replacement ~04/2021  PAIN:  Are you having pain? No  PRECAUTIONS: Fall  WEIGHT BEARING RESTRICTIONS No   TODAY'S TREATMENT:  GAIT: Gait pattern: step through pattern, decreased step length- Left, decreased stance time- Right, decreased stride length, decreased ankle dorsiflexion- Right, and lateral lean- Left Distance walked: 115' + 230' Assistive device utilized: Gaffer - 4 wheeled Level of assistance: SBA and CGA Comments:  Pt has intermittent left lean when using cane on left side with mild LOB to left x2 during turns on walkway.  She is somewhat more fatigued with increased distance using cane vs rollator.  Initial bout of gait performed with rollator to practice right heel strike and symmetrical steps in midline  alignment.  At countertop pt practices weight-shift in stride position alt leading foot, cued for toe off w/ RLE in rear and using anterior weight shift onto RLE when in front to stretch ankle and encourage forefoot pushoff in posterior shift - added to HEP  Seated calf raises w/ 4" step-added to HEP to help progress pt confidence and ability with standing calf raises  Obstacle course using cane over hurdles and weaving through cones to promote safety with turns.  Mod cues for initial bouts of cones to promote proper cane placement.  Min cues for sequencing over hurdles w/ pt progressing from needing intermittent contralateral handheld assist to SBA.  PATIENT EDUCATION: Education details: scar mobilization, proper hip strengthening, continue HEP, weaning off of rollator  Person educated: Patient and Spouse Education method: Explanation, Demonstration, and Handouts Education comprehension: verbalized understanding   HOME EXERCISE PROGRAM: Access Code: [email or web address] URL: [email or web address] Date: 08/11/2021 Prepared by: Excell Seltzer  Exercises - Heel Raises with Counter Support  - 1 x daily - 7 x weekly - 3 sets - 10 reps - Long Sitting Ankle Plantar Flexion with Resistance  - 1 x daily - 7 x weekly - 3 sets - 10 reps - Long Sitting Ankle Dorsiflexion with Anchored Resistance  - 1 x daily - 7 x weekly - 3 sets - 10 reps - Alternating Step Taps with Counter Support  - 1 x daily - 7 x weekly - 3 sets - 10 reps - Seated Ankle Alphabet  - 1 x daily - 7 x weekly - 3 sets - 10 reps - Ankle Circles on Wobble Board in Sitting  - 1 x daily - 7 x weekly - 3 sets - 10 reps - Seated Calf Raise on Step  - 1 x daily - 7 x weekly - 3 sets - 10 reps countertop pt practices weight-shift in stride position alt leading foot  GOALS: Goals reviewed with patient? Yes  SHORT TERM GOALS: Target date: 09/13/2021  Pt will be independent with initial HEP for improved balance and gait mechanics   Baseline: to be provided Goal status: INITIAL  2.  Pt will improve FGA to >/= 12/30 to demonstrate improved balance and reduced fall risk  Baseline: 7/30 Goal status: INITIAL  3.  Pt will improve gait speed to >/= 1.8ms to demonstrate improved community ambulation  Baseline: .224m with rollator  Goal status: REVISED   LONG TERM GOALS:  Target date: 10/18/2021  Pt will be independent with final HEP for improved balance and gait mechanics   Baseline: to be provided Goal status: INITIAL  2.  Pt will improve FGA to >/= 17/30 to demonstrate improved balance and reduced fall risk  Baseline: 7/30 Goal status: INITIAL  3.  Pt will improve gait speed to >/= 1.48ms to demonstrate improved community ambulation  Baseline: .229m with rollator  Goal status: REVISED  ASSESSMENT:  CLINICAL IMPRESSION: Focus of skilled session on gait training with use of small base quad cane (rubber tip SPC) with emphasis on mechanics of stride.  Pt continues to be limited by difficulty obtaining toe off on the RLE and symmetrical weight bearing, but is overall tolerating gradual transition to LRAD.  She continues to benefit from skilled PT to address ongoing balance, ROM, weight-bearing, gait, and strength deficits outlined in ongoing POC.   OBJECTIVE IMPAIRMENTS Abnormal gait, decreased activity tolerance, decreased balance, decreased endurance, decreased knowledge of condition, decreased knowledge of use of DME, difficulty walking, and decreased strength.   ACTIVITY LIMITATIONS carrying, lifting, bending, standing, squatting, stairs, and locomotion level  PARTICIPATION LIMITATIONS: meal prep, cleaning, laundry, driving, shopping, and community activity  PERSONAL FACTORS Age, Fitness, Past/current experiences, and 3+ comorbidities: HTN HLD, PVD, LE edema, recent R ankle replacement   are also affecting patient's functional outcome.   REHAB POTENTIAL: Good  CLINICAL DECISION MAKING:  Stable/uncomplicated  EVALUATION COMPLEXITY: Low  PLAN: PT FREQUENCY: 2x/week  PT DURATION: 10 weeks  PLANNED INTERVENTIONS: Therapeutic exercises, Therapeutic activity, Neuromuscular re-education, Balance training, Gait training, Patient/Family education, Self Care, Joint mobilization, Joint manipulation, Stair training, Vestibular training, Visual/preceptual remediation/compensation, Orthotic/Fit training, DME instructions, Aquatic Therapy, Dry Needling, Cognitive remediation, Electrical stimulation, Spinal manipulation, Spinal mobilization, Cryotherapy, Moist heat, Manual lymph drainage, scar mobilization, Taping, Manual therapy, and Re-evaluation  PLAN FOR NEXT SESSION: continue to work on balance, gait mechanics, R ankle strengthening, eccentric step-downs, increasing stance time on RLE, R hip strengthening, ramp/incline and stairs w/ quad rubber tip SPC, airex stair taps on RLE, hurdles -4", curb w/ cane, SLS on airex RLE   MaBary RichardPT, DPT  08/31/2021, 10:53 AM

## 2021-09-04 NOTE — Progress Notes (Unsigned)
[facility] [address] [place]. [place] [address] [phone]       HOSPITAL FOLLOW UP NOTE  [name] Date of Birth:  10-23-1945 Medical Record Number:  [id number]   Reason for Referral:  hospital stroke follow up    SUBJECTIVE:   CHIEF COMPLAINT:  No chief complaint on file.   HPI:   [name] is a 76 y.o. female with history of  hypertension, hyperlipidemia, hypothyroidism and PVD and GERD who presented on 07/23/2021 with right-sided weakness upon awakening.  Last known well would be 10 PM the night before.  Personally reviewed hospitalization pertinent progress notes, lab work and imaging.  Evaluated by Dr. Erlinda Hong for acute posterior left basal ganglia ischemic stroke likely secondary to small vessel disease.  CTA head/neck unremarkable.  EF 55 to 60%.  LDL 43.  A1c 5.4.  Recommended DAPT for 3 weeks and aspirin alone as well as continuation of Crestor 10 mg daily.  No prior stroke history.  Therapy evaluations recommended outpatient PT/OT.   Today, 09/05/2021, patient is being seen for initial hospital follow-up.   Completed 3 weeks DAPT, remains on aspirin alone as well as Crestor, denies side effects Blood pressure today ***     PERTINENT IMAGING  Per hospitalization 07/23/2021 CTA head & neck Normal CTA of the head and neck. MRI  1. 12 mm acute ischemic nonhemorrhagic posterior left basal ganglia infarct. 2. Underlying mild to moderate chronic microvascular ischemic disease. 2D Echo EF 55 to 60% LDL 43 HgbA1c 5.4    ROS:   14 system review of systems performed and negative with exception of ***  PMH:  Past Medical History:  Diagnosis Date   Allergic rhinitis    Asthma    seasonal    Bursitis    r hip    GERD (gastroesophageal reflux disease)    High cholesterol    Hypercholesteremia    Hypertension    Hypothyroidism    Hypothyroidism    Insomnia    Leukocytosis    Obesity    Peripheral vascular disease ([facility])     bilateral LE edema ; edma in hands as well    PONV (postoperative nausea and vomiting)    Thrombocytosis    Vitamin D deficiency     PSH:  Past Surgical History:  Procedure Laterality Date   bladder      bladder tack  20 years ago    COLONOSCOPY     EYE SURGERY Bilateral    catracts   hysterectomy      20 years    OPEN SURGICAL REPAIR OF GLUTEAL TENDON Right 09/19/2016   Procedure: Right hip bursectomy and gluteal tendon repair;  Surgeon: Gaynelle Arabian, MD;  Location: [facility];  Service: Orthopedics;  Laterality: Right;   TONSILLECTOMY     age 7    TOTAL ANKLE ARTHROPLASTY Right 04/27/2021   Procedure: TOTAL ANKLE ARTHROPLASTY;  Surgeon: Wylene Simmer, MD;  Location: [facility];  Service: Orthopedics;  Laterality: Right;    Social History:  Social History   Socioeconomic History   Marital status: Married    Spouse name: Not on file   Number of children: Not on file   Years of education: Not on file   Highest education level: Not on file  Occupational History   Not on file  Tobacco Use   Smoking status: Former    Packs/day: 0.30    Years: 10.00    Total pack years:  3.00    Types: Cigarettes   Smokeless tobacco: Never   Tobacco comments:    25 years ago quit   Vaping Use   Vaping Use: Never used  Substance and Sexual Activity   Alcohol use: Yes    Comment: occasionally wine    Drug use: No   Sexual activity: Not Currently  Other Topics Concern   Not on file  Social History Narrative   Not on file   Social Determinants of Health   Financial Resource Strain: Not on file  Food Insecurity: Not on file  Transportation Needs: Not on file  Physical Activity: Not on file  Stress: Not on file  Social Connections: Not on file  Intimate Partner Violence: Not on file    Family History:  Family History  Problem Relation Age of Onset   Colon cancer Mother        46s    Medications:   Current Outpatient Medications on File Prior to Visit   Medication Sig Dispense Refill   acetaminophen (TYLENOL) 500 MG tablet Take 1,000 mg by mouth 3 (three) times daily.     aspirin 81 MG chewable tablet Chew 1 tablet (81 mg total) by mouth daily. 30 tablet 11   gabapentin (NEURONTIN) 300 MG capsule Take 600 mg by mouth 3 (three) times daily.     glycerin adult 2 g suppository Place 1 suppository rectally as needed for constipation. 12 suppository 0   levothyroxine (SYNTHROID) 112 MCG tablet Take 112 mcg by mouth daily before breakfast.     losartan (COZAAR) 100 MG tablet Take 100 mg by mouth daily.     montelukast (SINGULAIR) 10 MG tablet Take 1 tablet by mouth daily.     RABEprazole (ACIPHEX) 20 MG tablet Take 20 mg by mouth daily before breakfast.      rosuvastatin (CRESTOR) 10 MG tablet Take 10 mg by mouth daily.      triamcinolone (NASACORT ALLERGY 24HR) 55 MCG/ACT AERO nasal inhaler Place 2 sprays into the nose daily.     Vitamin D, Ergocalciferol, (DRISDOL) 1.25 MG (50000 UNIT) CAPS capsule Take 50,000 Units by mouth every Monday. Patient takes on Wednesday     No current facility-administered medications on file prior to visit.    Allergies:   Allergies  Allergen Reactions   Yellow Jacket Venom [Bee Venom] Anaphylaxis    Pt has EPI PEN   Codeine Nausea And Vomiting and Other (See Comments)   Milk (Cow)    Other     Lactose Intolerant     Penicillins Hives and Other (See Comments)    Many years - dr does not put pt on this   Procaine Other (See Comments)    Other reaction(s): fever,chills   Simvastatin Other (See Comments)    Other reaction(s): rash   Chlorhexidine Rash    With preop washes - patient states fine to use Chloraprep      OBJECTIVE:  Physical Exam  There were no vitals filed for this visit. There is no height or weight on file to calculate BMI. No results found.      No data to display           General: well developed, well nourished, seated, in no evident distress Head: head normocephalic  and atraumatic.   Neck: supple with no carotid or supraclavicular bruits Cardiovascular: regular rate and rhythm, no murmurs Musculoskeletal: no deformity Skin:  no rash/petichiae Vascular:  Normal pulses all extremities   Neurologic Exam Mental  Status: Awake and fully alert. Oriented to place and time. Recent and remote memory intact. Attention span, concentration and fund of knowledge appropriate. Mood and affect appropriate.  Cranial Nerves: Fundoscopic exam reveals sharp disc margins. Pupils equal, briskly reactive to light. Extraocular movements full without nystagmus. Visual fields full to confrontation. Hearing intact. Facial sensation intact. Face, tongue, palate moves normally and symmetrically.  Motor: Normal bulk and tone. Normal strength in all tested extremity muscles Sensory.: intact to touch , pinprick , position and vibratory sensation.  Coordination: Rapid alternating movements normal in all extremities. Finger-to-nose and heel-to-shin performed accurately bilaterally. Gait and Station: Arises from chair without difficulty. Stance is normal. Gait demonstrates normal stride length and balance with ***. Tandem walk and heel toe ***.  Reflexes: 1+ and symmetric. Toes downgoing.     NIHSS  *** Modified Rankin  ***      ASSESSMENT: Monica Hinton is a 77 y.o. year old female with posterior left BG ischemic stroke on 07/23/2021 likely secondary to small vessel disease. Vascular risk factors include HTN, HLD, advanced age and ocular migraines.      PLAN:  Left BG stroke:  Residual deficit: ***.  Continue aspirin 81 mg daily  and Crestor 10 mg daily for secondary stroke prevention.   Discussed secondary stroke prevention measures and importance of close PCP follow up for aggressive stroke risk factor management including BP goal<130/90, HLD with LDL goal<70 and DM with A1c.<7 .  Stroke labs 07/2021: LDL 43, A1c 5.4 I have gone over the pathophysiology of stroke, warning  signs and symptoms, risk factors and their management in some detail with instructions to go to the closest emergency room for symptoms of concern.     Follow up in *** or call earlier if needed   CC:  [facility] provider: Dr. Leonie Man PCP: Orpah Melter, MD    I spent *** minutes of face-to-face and non-face-to-face time with patient.  This included previsit chart review including review of recent hospitalization, lab review, study review, order entry, electronic health record documentation, patient education regarding recent stroke including etiology, secondary stroke prevention measures and importance of managing stroke risk factors, residual deficits and typical recovery time and answered all other questions to patient satisfaction   Frann Rider, AGNP-BC  [facility] [address] [place], [place] [address]  Phone [phone] Fax [phone] Note: This document was prepared with digital dictation and possible smart phrase technology. Any transcriptional errors that result from this process are unintentional.

## 2021-09-05 ENCOUNTER — Encounter: Payer: Self-pay | Admitting: Adult Health

## 2021-09-05 ENCOUNTER — Ambulatory Visit: Payer: Medicare Other | Admitting: Physical Therapy

## 2021-09-05 ENCOUNTER — Encounter: Payer: Self-pay | Admitting: Physical Therapy

## 2021-09-05 ENCOUNTER — Ambulatory Visit: Payer: Medicare Other | Admitting: Adult Health

## 2021-09-05 VITALS — BP 146/81 | HR 73 | Ht 63.5 in | Wt 173.0 lb

## 2021-09-05 DIAGNOSIS — R278 Other lack of coordination: Secondary | ICD-10-CM | POA: Diagnosis not present

## 2021-09-05 DIAGNOSIS — R262 Difficulty in walking, not elsewhere classified: Secondary | ICD-10-CM | POA: Diagnosis not present

## 2021-09-05 DIAGNOSIS — Z09 Encounter for follow-up examination after completed treatment for conditions other than malignant neoplasm: Secondary | ICD-10-CM | POA: Diagnosis not present

## 2021-09-05 DIAGNOSIS — I6381 Other cerebral infarction due to occlusion or stenosis of small artery: Secondary | ICD-10-CM | POA: Diagnosis not present

## 2021-09-05 DIAGNOSIS — M6281 Muscle weakness (generalized): Secondary | ICD-10-CM | POA: Diagnosis not present

## 2021-09-05 DIAGNOSIS — R2681 Unsteadiness on feet: Secondary | ICD-10-CM

## 2021-09-05 NOTE — Therapy (Signed)
OUTPATIENT PHYSICAL THERAPY NEURO TREATMENT   Patient Name: Monica Hinton MRN: [id number] DOB:1946-01-02, 76 y.o., female Today's Date: 09/05/2021   PCP: Orpah Melter, MD REFERRING PROVIDER: Elodia Florence., MD    PT End of Session - 09/05/21 1107     Visit Number 8    Number of Visits 21    Date for PT Re-Evaluation 10/18/21    Authorization Type [employer] medicare    Progress Note Due on Visit 10    PT Start Time 1105    PT Stop Time 1148    PT Time Calculation (min) 43 min    Equipment Utilized During Treatment Gait belt    Activity Tolerance Patient tolerated treatment well    Behavior During Therapy WFL for tasks assessed/performed              Past Medical History:  Diagnosis Date   Allergic rhinitis    Asthma    seasonal    Bursitis    r hip    GERD (gastroesophageal reflux disease)    High cholesterol    Hypercholesteremia    Hypertension    Hypothyroidism    Hypothyroidism    Insomnia    Leukocytosis    Obesity    Peripheral vascular disease ([place])    bilateral LE edema ; edma in hands as well    PONV (postoperative nausea and vomiting)    Thrombocytosis    Vitamin D deficiency    Past Surgical History:  Procedure Laterality Date   bladder      bladder tack  20 years ago    COLONOSCOPY     EYE SURGERY Bilateral    catracts   hysterectomy      20 years    LUMBAR FUSION     OPEN SURGICAL REPAIR OF GLUTEAL TENDON Right 09/19/2016   Procedure: Right hip bursectomy and gluteal tendon repair;  Surgeon: Gaynelle Arabian, MD;  Location: [facility];  Service: Orthopedics;  Laterality: Right;   TONSILLECTOMY     age 48    TOTAL ANKLE ARTHROPLASTY Right 04/27/2021   Procedure: TOTAL ANKLE ARTHROPLASTY;  Surgeon: Wylene Simmer, MD;  Location: [place];  Service: Orthopedics;  Laterality: Right;   Patient Active Problem List   Diagnosis Date Noted   Syncope 08/17/2021   Leukocytosis 08/17/2021   History of CVA (cerebrovascular  accident) 08/17/2021   Hormone replacement therapy 07/25/2021   Essential hypertension 07/25/2021   Dyslipidemia 07/25/2021   Asthma, chronic 07/25/2021   Acute CVA (cerebrovascular accident) ([place]) 07/24/2021   Spondylolisthesis of lumbar region 09/10/2019   Tendinopathy of right gluteus medius 09/19/2016    ONSET DATE: 07/23/21  REFERRING DIAG: 63.9 (ICD-10-CM) - Acute CVA (cerebrovascular accident) ([place])   THERAPY DIAG:  Difficulty in walking, not elsewhere classified  Muscle weakness (generalized)  Other lack of coordination  Unsteadiness on feet  Rationale for Evaluation and Treatment Rehabilitation  SUBJECTIVE:  SUBJECTIVE STATEMENT: Pt denies falls or near misses.  She states the ankle circles exercise that she does with the pillow is somewhat uncomfortable so she has stopped doing this.    Pt accompanied by: significant other [name]  PERTINENT HISTORY: HTN,HLD,hypothyroidism,asthma PVD chronic b/l lower extremity edema, recent ankle replacement ~04/2021  PAIN:  Are you having pain? No  PRECAUTIONS: Fall  WEIGHT BEARING RESTRICTIONS No   TODAY'S TREATMENT:  GAIT: Gait pattern: step through pattern, decreased step length- Left, decreased stance time- Right, decreased stride length, decreased ankle dorsiflexion- Right, and lateral lean- Left Distance walked: 76' + 115' Assistive device utilized: Quad cane small base Level of assistance: SBA and CGA Comments:  Moderate cues to promote right toe off, even step lengths, and pacing with the cane to prevent getting ahead of the cane. Completed single lap on level surface following ramp to practice even weight shift left to right overground as practiced on incline.  RAMP:  Level of Assistance: SBA Assistive device utilized: Quad cane  small base Ramp Comments: Pt ambulates up and down incline x6 w/ mod cues and SBA to inc time spent on RLE to inc heel cord stretch and weight bearing tolerance during toe off.  CURB:  Level of Assistance: SBA, CGA, and Min A Assistive device utilized: Environmental manager Comments: Performed x6 w/ intermittent HHA for pt confidence, practiced x1 w/ RLE leading up and LLE leading down so patient could feel difference-edu on not doing this at home due to functional quad weakness.  STAIRS:  Level of Assistance: SBA and CGA  Stair Negotiation Technique: Step to Pattern with Single Rail on Right  Number of Stairs: 8 + 4   Height of Stairs: 6"  Comments: Pt may benefit from additional practice of just cane on stairs in future sessions.  Edu on "up with the good and down with the bad".  Pt uses right rail ascending and cane in left UE for increased independence with stair management.  PATIENT EDUCATION: Education details: Discussed taking cane and rollator to gym and completing part of walk with husband and cane, locate places to sit for rest when pt begins to notice overt signs of fatigue in the ankle. Person educated: Patient and Spouse Education method: Explanation, Demonstration, and Handouts Education comprehension: verbalized understanding   HOME EXERCISE PROGRAM: Access Code: [email or web address] URL: [email or web address] Date: 08/11/2021 Prepared by: Excell Seltzer  Exercises - Heel Raises with Counter Support  - 1 x daily - 7 x weekly - 3 sets - 10 reps - Long Sitting Ankle Plantar Flexion with Resistance  - 1 x daily - 7 x weekly - 3 sets - 10 reps - Long Sitting Ankle Dorsiflexion with Anchored Resistance  - 1 x daily - 7 x weekly - 3 sets - 10 reps - Alternating Step Taps with Counter Support  - 1 x daily - 7 x weekly - 3 sets - 10 reps - Seated Ankle Alphabet  - 1 x daily - 7 x weekly - 3 sets - 10 reps - Ankle Circles on Wobble Board in Sitting  - 1 x daily - 7 x  weekly - 3 sets - 10 reps - Seated Calf Raise on Step  - 1 x daily - 7 x weekly - 3 sets - 10 reps countertop pt practices weight-shift in stride position alt leading foot  GOALS: Goals reviewed with patient? Yes  SHORT TERM GOALS: Target date: 09/13/2021  Pt will be independent with initial HEP for  improved balance and gait mechanics  Baseline: to be provided Goal status: INITIAL  2.  Pt will improve FGA to >/= 12/30 to demonstrate improved balance and reduced fall risk  Baseline: 7/30 Goal status: INITIAL  3.  Pt will improve gait speed to >/= 1.58ms to demonstrate improved community ambulation  Baseline: .280m with rollator  Goal status: REVISED   LONG TERM GOALS: Target date: 10/18/2021  Pt will be independent with final HEP for improved balance and gait mechanics   Baseline: to be provided Goal status: INITIAL  2.  Pt will improve FGA to >/= 17/30 to demonstrate improved balance and reduced fall risk  Baseline: 7/30 Goal status: INITIAL  3.  Pt will improve gait speed to >/= 1.94m47mto demonstrate improved community ambulation  Baseline: .75m18mith rollator  Goal status: REVISED  ASSESSMENT:  CLINICAL IMPRESSION: Focus of skilled session on addressing progression of ambulatory distance w/ SPC w/ quad tip with improved gait mechanics.  Pt able to practice repetitions of curb step w/ close supervision to HHA [facility] pt confidence and safety.  Initiated stair training with use of cane this session with pt demonstrating appropriate sequencing and safety at supervision level, but would benefit from further stair management practice with only cane and no rails as is her daughter's home setup which pt frequents.  Will continue to address impairments as outlined in ongoing PT POC.   OBJECTIVE IMPAIRMENTS Abnormal gait, decreased activity tolerance, decreased balance, decreased endurance, decreased knowledge of condition, decreased knowledge of use of DME, difficulty walking, and  decreased strength.   ACTIVITY LIMITATIONS carrying, lifting, bending, standing, squatting, stairs, and locomotion level  PARTICIPATION LIMITATIONS: meal prep, cleaning, laundry, driving, shopping, and community activity  PERSONAL FACTORS Age, Fitness, Past/current experiences, and 3+ comorbidities: HTN HLD, PVD, LE edema, recent R ankle replacement   are also affecting patient's functional outcome.   REHAB POTENTIAL: Good  CLINICAL DECISION MAKING: Stable/uncomplicated  EVALUATION COMPLEXITY: Low  PLAN: PT FREQUENCY: 2x/week  PT DURATION: 10 weeks  PLANNED INTERVENTIONS: Therapeutic exercises, Therapeutic activity, Neuromuscular re-education, Balance training, Gait training, Patient/Family education, Self Care, Joint mobilization, Joint manipulation, Stair training, Vestibular training, Visual/preceptual remediation/compensation, Orthotic/Fit training, DME instructions, Aquatic Therapy, Dry Needling, Cognitive remediation, Electrical stimulation, Spinal manipulation, Spinal mobilization, Cryotherapy, Moist heat, Manual lymph drainage, scar mobilization, Taping, Manual therapy, and Re-evaluation  PLAN FOR NEXT SESSION: continue to work on balance, gait mechanics, R ankle strengthening, eccentric step-downs, increasing stance time on RLE, R hip strengthening, stairs w/ quad rubber tip SPC no rail, airex stair taps on RLE, hurdles -4", curb w/ cane, SLS on airex RLE, may need to practice cane on gravel-pt has packed gravel driveway and is nervous about this, static balance on incline   MariBary Richard, DPT  09/05/2021, 2:00 PM

## 2021-09-05 NOTE — Progress Notes (Signed)
I agree with the above plan 

## 2021-09-05 NOTE — Patient Instructions (Addendum)
Continue working with therapies for hopeful ongoing recovery - keep up the good work!   Continue aspirin 81 mg daily  and Crestor  for secondary stroke prevention  Continue to follow up with PCP regarding cholesterol and blood pressure management  Maintain strict control of hypertension with blood pressure goal below 130/90 and cholesterol with LDL cholesterol (bad cholesterol) goal below 70 mg/dL.   Signs of a Stroke? Follow the BEFAST method:  Balance Watch for a sudden loss of balance, trouble with coordination or vertigo Eyes Is there a sudden loss of vision in one or both eyes? Or double vision?  Face: Ask the person to smile. Does one side of the face droop or is it numb?  Arms: Ask the person to raise both arms. Does one arm drift downward? Is there weakness or numbness of a leg? Speech: Ask the person to repeat a simple phrase. Does the speech sound slurred/strange? Is the person confused ? Time: If you observe any of these signs, call 911.    Followup in the future with me in 4 months or call earlier if needed       Thank you for coming to see [place] at [facility]. I hope we have been able to provide you high quality care today.  You may receive a patient satisfaction survey over the next few weeks. We would appreciate your feedback and comments so that we may continue to improve ourselves and the health of our patients.    Stroke Prevention Some medical conditions and lifestyle choices can lead to a higher risk for a stroke. You can help to prevent a stroke by eating healthy foods and exercising. It also helps to not smoke and to manage any health problems you may have. How can this condition affect me? A stroke is an emergency. It should be treated right away. A stroke can lead to brain damage or threaten your life. There is a better chance of surviving and getting better after a stroke if you get medical help right away. What can increase my risk? The  following medical conditions may increase your risk of a stroke: Diseases of the heart and blood vessels (cardiovascular disease). High blood pressure (hypertension). Diabetes. High cholesterol. Sickle cell disease. Problems with blood clotting. Being very overweight. Sleeping problems (obstructivesleep apnea). Other risk factors include: Being older than age 21. A history of blood clots, stroke, or mini-stroke (TIA). Race, ethnic background, or a family history of stroke. Smoking or using tobacco products. Taking birth control pills, especially if you smoke. Heavy alcohol and drug use. Not being active. What actions can I take to prevent this? Manage your health conditions High cholesterol. Eat a healthy diet. If this is not enough to manage your cholesterol, you may need to take medicines. Take medicines as told by your doctor. High blood pressure. Try to keep your blood pressure below 130/80. If your blood pressure cannot be managed through a healthy diet and regular exercise, you may need to take medicines. Take medicines as told by your doctor. Ask your doctor if you should check your blood pressure at home. Have your blood pressure checked every year. Diabetes. Eat a healthy diet and get regular exercise. If your blood sugar (glucose) cannot be managed through diet and exercise, you may need to take medicines. Take medicines as told by your doctor. Talk to your doctor about getting checked for sleeping problems. Signs of a problem can include: Snoring a lot. Feeling very tired. Make  sure that you manage any other conditions you have. Nutrition  Follow instructions from your doctor about what to eat or drink. You may be told to: Eat and drink fewer calories each day. Limit how much salt (sodium) you use to 1,500 milligrams (mg) each day. Use only healthy fats for cooking, such as olive oil, canola oil, and sunflower oil. Eat healthy foods. To do this: Choose foods that  are high in fiber. These include whole grains, and fresh fruits and vegetables. Eat at least 5 servings of fruits and vegetables a day. Try to fill one-half of your plate with fruits and vegetables at each meal. Choose low-fat (lean) proteins. These include low-fat cuts of meat, chicken without skin, fish, tofu, beans, and nuts. Eat low-fat dairy products. Avoid foods that: Are high in salt. Have saturated fat. Have trans fat. Have cholesterol. Are processed or pre-made. Count how many carbohydrates you eat and drink each day. Lifestyle If you drink alcohol: Limit how much you have to: 0-1 drink a day for women who are not pregnant. 0-2 drinks a day for men. Know how much alcohol is in your drink. In the [place] one drink equals one 12 oz bottle of beer (375m), one 5 oz glass of wine (1428m, or one 1 oz glass of hard liquor (44104m Do not smoke or use any products that have nicotine or tobacco. If you need help quitting, ask your doctor. Avoid secondhand smoke. Do not use drugs. Activity  Try to stay at a healthy weight. Get at least 30 minutes of exercise on most days, such as: Fast walking. Biking. Swimming. Medicines Take over-the-counter and prescription medicines only as told by your doctor. Avoid taking birth control pills. Talk to your doctor about the risks of taking birth control pills if: You are over 35 88ars old. You smoke. You get very bad headaches. You have had a blood clot. Where to find more information American Stroke Association: www.strokeassociation.org Get help right away if: You or a loved one has any signs of a stroke. "BE FAST" is an easy way to remember the warning signs: B - Balance. Dizziness, sudden trouble walking, or loss of balance. E - Eyes. Trouble seeing or a change in how you see. F - Face. Sudden weakness or loss of feeling of the face. The face or eyelid may droop on one side. A - Arms. Weakness or loss of feeling in an arm. This happens  all of a sudden and most often on one side of the body. S - Speech. Sudden trouble speaking, slurred speech, or trouble understanding what people say. T - Time. Time to call emergency services. Write down what time symptoms started. You or a loved one has other signs of a stroke, such as: A sudden, very bad headache with no known cause. Feeling like you may vomit (nausea). Vomiting. A seizure. These symptoms may be an emergency. Get help right away. Call your local emergency services (911 in the [place] Do not wait to see if the symptoms will go away. Do not drive yourself to the hospital. Summary You can help to prevent a stroke by eating healthy, exercising, and not smoking. It also helps to manage any health problems you have. Do not smoke or use any products that contain nicotine or tobacco. Get help right away if you or a loved one has any signs of a stroke. This information is not intended to replace advice given to you by your health care provider. Make sure  you discuss any questions you have with your health care provider. Document Revised: 08/03/2019 Document Reviewed: 08/03/2019 Elsevier Patient Education  [place].

## 2021-09-07 ENCOUNTER — Encounter: Payer: Self-pay | Admitting: Physical Therapy

## 2021-09-07 ENCOUNTER — Ambulatory Visit: Payer: Medicare Other | Admitting: Physical Therapy

## 2021-09-07 DIAGNOSIS — M6281 Muscle weakness (generalized): Secondary | ICD-10-CM | POA: Diagnosis not present

## 2021-09-07 DIAGNOSIS — R262 Difficulty in walking, not elsewhere classified: Secondary | ICD-10-CM | POA: Diagnosis not present

## 2021-09-07 DIAGNOSIS — R2681 Unsteadiness on feet: Secondary | ICD-10-CM | POA: Diagnosis not present

## 2021-09-07 DIAGNOSIS — R278 Other lack of coordination: Secondary | ICD-10-CM

## 2021-09-07 NOTE — Therapy (Signed)
OUTPATIENT PHYSICAL THERAPY NEURO TREATMENT   Patient Name: ORALIA CRIGER MRN: [id number] DOB:[birth year], 76 y.o., female Today's Date: 09/07/2021   PCP: Orpah Melter, MD REFERRING PROVIDER: Elodia Florence., MD    PT End of Session - 09/07/21 0935     Visit Number 9    Number of Visits 21    Date for PT Re-Evaluation 10/18/21    Authorization Type [employer] medicare    Progress Note Due on Visit 10    PT Start Time 0933    PT Stop Time 1018    PT Time Calculation (min) 45 min    Equipment Utilized During Treatment Gait belt    Activity Tolerance Patient tolerated treatment well    Behavior During Therapy WFL for tasks assessed/performed              Past Medical History:  Diagnosis Date   Allergic rhinitis    Asthma    seasonal    Bursitis    r hip    GERD (gastroesophageal reflux disease)    High cholesterol    Hypercholesteremia    Hypertension    Hypothyroidism    Hypothyroidism    Insomnia    Leukocytosis    Obesity    Peripheral vascular disease ([place])    bilateral LE edema ; edma in hands as well    PONV (postoperative nausea and vomiting)    Thrombocytosis    Vitamin D deficiency    Past Surgical History:  Procedure Laterality Date   bladder      bladder tack  20 years ago    COLONOSCOPY     EYE SURGERY Bilateral    catracts   hysterectomy      20 years    LUMBAR FUSION     OPEN SURGICAL REPAIR OF GLUTEAL TENDON Right 09/19/2016   Procedure: Right hip bursectomy and gluteal tendon repair;  Surgeon: Gaynelle Arabian, MD;  Location: [facility];  Service: Orthopedics;  Laterality: Right;   TONSILLECTOMY     age 41    TOTAL ANKLE ARTHROPLASTY Right 04/27/2021   Procedure: TOTAL ANKLE ARTHROPLASTY;  Surgeon: Wylene Simmer, MD;  Location: [place];  Service: Orthopedics;  Laterality: Right;   Patient Active Problem List   Diagnosis Date Noted   Syncope 08/17/2021   Leukocytosis 08/17/2021   History of CVA (cerebrovascular  accident) 08/17/2021   Hormone replacement therapy 07/25/2021   Essential hypertension 07/25/2021   Dyslipidemia 07/25/2021   Asthma, chronic 07/25/2021   Acute CVA (cerebrovascular accident) ([place]) 07/24/2021   Spondylolisthesis of lumbar region 09/10/2019   Tendinopathy of right gluteus medius 09/19/2016    ONSET DATE: 07/23/21  REFERRING DIAG: 63.9 (ICD-10-CM) - Acute CVA (cerebrovascular accident) ([place])   THERAPY DIAG:  Difficulty in walking, not elsewhere classified  Muscle weakness (generalized)  Other lack of coordination  Unsteadiness on feet  Rationale for Evaluation and Treatment Rehabilitation  SUBJECTIVE:  SUBJECTIVE STATEMENT: Pt states she has not fallen.  She used the cane to walk up and down their ramp at home and on gravel with husband without issue.  She states using the curb in PT last session bothered her back.  Pt accompanied by: significant other [name]  PERTINENT HISTORY: HTN,HLD,hypothyroidism,asthma PVD chronic b/l lower extremity edema, recent ankle replacement ~04/2021  PAIN:  Are you having pain? No  PRECAUTIONS: Fall  WEIGHT BEARING RESTRICTIONS No   TODAY'S TREATMENT:  -4" hurdles w/ cane, min cues for sequencing of cane, 4x10', practiced w/ LLE and RLE leading w/ pt demonstrating inc lateral lean onto cane when RLE leading, advised pt that using the LLE to step into tub may be safest for her at this time due to limitation with right hip flexion -standing on incline FT EO > EC 2x30 > alt semi-tandem x30 sec each w/ CGA inc sway noted -step taps to 3rd step w/ alt LE in stance using Bil rails for balance, pt fatigued (noted in right glut primarily) w/ prolonged rest following  GAIT: Gait pattern: step through pattern, decreased step length- Left, decreased stance  time- Right, decreased stride length, decreased ankle dorsiflexion- Right, and lateral lean- Left Distance walked: 401' Assistive device utilized: Quad cane small base Level of assistance: SBA and CGA Comments:  Pt demonstrates intermittent toe drag during third lap that is correctable w/ cues, but difficult to maintain for long distances due to fatigue.  She tolerates ~70' farther from session prior.  STAIRS:  Level of Assistance: SBA and CGA  Stair Negotiation Technique: Step to Pattern with No Rails  Number of Stairs: 12   Height of Stairs: 6"  Comments: Intermittent CGA during initial descent due to noted pt hesitancy and touching of RUE of right rail during second descent.  Pt requires repeated cues for sequencing on each bout.  PATIENT EDUCATION: Education details: Discussed taking cane and rollator to gym and completing part of walk with husband and cane, locate places to sit for rest when pt begins to notice overt signs of fatigue in the ankle. Person educated: Patient and Spouse Education method: Explanation, Demonstration, and Handouts Education comprehension: verbalized understanding   HOME EXERCISE PROGRAM: Access Code: [email or web address] URL: [email or web address] Date: 08/11/2021 Prepared by: Excell Seltzer  Exercises - Heel Raises with Counter Support  - 1 x daily - 7 x weekly - 3 sets - 10 reps - Long Sitting Ankle Plantar Flexion with Resistance  - 1 x daily - 7 x weekly - 3 sets - 10 reps - Long Sitting Ankle Dorsiflexion with Anchored Resistance  - 1 x daily - 7 x weekly - 3 sets - 10 reps - Alternating Step Taps with Counter Support  - 1 x daily - 7 x weekly - 3 sets - 10 reps - Seated Ankle Alphabet  - 1 x daily - 7 x weekly - 3 sets - 10 reps - Ankle Circles on Wobble Board in Sitting  - 1 x daily - 7 x weekly - 3 sets - 10 reps - Seated Calf Raise on Step  - 1 x daily - 7 x weekly - 3 sets - 10 reps countertop pt practices weight-shift in stride position  alt leading foot  GOALS: Goals reviewed with patient? Yes  SHORT TERM GOALS: Target date: 09/13/2021  Pt will be independent with initial HEP for improved balance and gait mechanics  Baseline: to be provided Goal status: INITIAL  2.  Pt will improve FGA to >/=  12/30 to demonstrate improved balance and reduced fall risk  Baseline: 7/30 Goal status: INITIAL  3.  Pt will improve gait speed to >/= 1.76ms to demonstrate improved community ambulation  Baseline: .2932m with rollator  Goal status: REVISED   LONG TERM GOALS: Target date: 10/18/2021  Pt will be independent with final HEP for improved balance and gait mechanics   Baseline: to be provided Goal status: INITIAL  2.  Pt will improve FGA to >/= 17/30 to demonstrate improved balance and reduced fall risk  Baseline: 7/30 Goal status: INITIAL  3.  Pt will improve gait speed to >/= 1.32m35mto demonstrate improved community ambulation  Baseline: .9m39mith rollator  Goal status: REVISED  ASSESSMENT:  CLINICAL IMPRESSION: Focus of skilled session on continuing to address gait tolerance and mechanics including improved right ankle DF.  She remains limited by LE fatigue and hip muscular weakness impacting ambulatory tolerance and dynamic balance w/o UE support.  She continues to benefit from skilled PT to address deficits noted and progress towards LTGs.   OBJECTIVE IMPAIRMENTS Abnormal gait, decreased activity tolerance, decreased balance, decreased endurance, decreased knowledge of condition, decreased knowledge of use of DME, difficulty walking, and decreased strength.   ACTIVITY LIMITATIONS carrying, lifting, bending, standing, squatting, stairs, and locomotion level  PARTICIPATION LIMITATIONS: meal prep, cleaning, laundry, driving, shopping, and community activity  PERSONAL FACTORS Age, Fitness, Past/current experiences, and 3+ comorbidities: HTN HLD, PVD, LE edema, recent R ankle replacement   are also affecting  patient's functional outcome.   REHAB POTENTIAL: Good  CLINICAL DECISION MAKING: Stable/uncomplicated  EVALUATION COMPLEXITY: Low  PLAN: PT FREQUENCY: 2x/week  PT DURATION: 10 weeks  PLANNED INTERVENTIONS: Therapeutic exercises, Therapeutic activity, Neuromuscular re-education, Balance training, Gait training, Patient/Family education, Self Care, Joint mobilization, Joint manipulation, Stair training, Vestibular training, Visual/preceptual remediation/compensation, Orthotic/Fit training, DME instructions, Aquatic Therapy, Dry Needling, Cognitive remediation, Electrical stimulation, Spinal manipulation, Spinal mobilization, Cryotherapy, Moist heat, Manual lymph drainage, scar mobilization, Taping, Manual therapy, and Re-evaluation  PLAN FOR NEXT SESSION: Assess STGs!  continue to work on balance, gait mechanics, R ankle strengthening, eccentric step-downs, increasing stance time on RLE, R hip strengthening, continue stairs w/ quad rubber tip SPC no rail-descent, airex stair taps on RLE, continue hurdles 4", SLS on airex RLE, may need to practice cane on gravel-pt has packed gravel driveway and is nervous about this, static balance-EC (add to HEP?)   MariBary Richard, DPT  09/07/2021, 11:27 AM

## 2021-09-11 NOTE — Therapy (Addendum)
OUTPATIENT OCCUPATIONAL THERAPY NEURO TREATMENT  Patient Name: Monica Hinton MRN: [id number] DOB:January 03, 1946, 76 y.o., female Today's Date: 09/12/2021  PCP: Orpah Melter, MD  REFERRING PROVIDER: PCP: Orpah Melter, MD    OT End of Session - 09/12/21 1023     Visit Number 5    Number of Visits 8    Date for OT Re-Evaluation 10/12/21    Authorization Type [employer] MCR    OT Start Time 1022    OT Stop Time 1100    OT Time Calculation (min) 38 min    Activity Tolerance Patient tolerated treatment well    Behavior During Therapy WFL for tasks assessed/performed              Past Medical History:  Diagnosis Date   Allergic rhinitis    Asthma    seasonal    Bursitis    r hip    GERD (gastroesophageal reflux disease)    High cholesterol    Hypercholesteremia    Hypertension    Hypothyroidism    Hypothyroidism    Insomnia    Leukocytosis    Obesity    Peripheral vascular disease ([place])    bilateral LE edema ; edma in hands as well    PONV (postoperative nausea and vomiting)    Thrombocytosis    Vitamin D deficiency    Past Surgical History:  Procedure Laterality Date   bladder      bladder tack  20 years ago    COLONOSCOPY     EYE SURGERY Bilateral    catracts   hysterectomy      20 years    LUMBAR FUSION     OPEN SURGICAL REPAIR OF GLUTEAL TENDON Right 09/19/2016   Procedure: Right hip bursectomy and gluteal tendon repair;  Surgeon: Gaynelle Arabian, MD;  Location: [facility];  Service: Orthopedics;  Laterality: Right;   TONSILLECTOMY     age 4    TOTAL ANKLE ARTHROPLASTY Right 04/27/2021   Procedure: TOTAL ANKLE ARTHROPLASTY;  Surgeon: Wylene Simmer, MD;  Location: [place];  Service: Orthopedics;  Laterality: Right;   Patient Active Problem List   Diagnosis Date Noted   Syncope 08/17/2021   Leukocytosis 08/17/2021   History of CVA (cerebrovascular accident) 08/17/2021   Hormone replacement therapy 07/25/2021   Essential hypertension  07/25/2021   Dyslipidemia 07/25/2021   Asthma, chronic 07/25/2021   Acute CVA (cerebrovascular accident) ([place]) 07/24/2021   Spondylolisthesis of lumbar region 09/10/2019   Tendinopathy of right gluteus medius 09/19/2016    ONSET DATE: 07/23/2021   REFERRING DIAG: I63.9 (ICD-10-CM) - Acute CVA (cerebrovascular accident) ([place])   MRI with 12 mm acute ischemic nonhemorrhagic posterior L basal ganglia infarct   THERAPY DIAG:  Muscle weakness (generalized)  Other lack of coordination  Rationale for Evaluation and Treatment Rehabilitation  SUBJECTIVE:   SUBJECTIVE STATEMENT: Little tired from PT.  Pt reports that R shoulder is more due to arthritis and that she is concerned that it is aggravated by exercises with incr discomfort.   Pt accompanied by: significant other  PERTINENT HISTORY: medical history significant of  HTN,HLD,hypothyroidism,asthma PVD chronic b/l lower extremity edema, L4-5 fusion 2021, Rt ankle replacement 04/27/21  PRECAUTIONS: Fall  WEIGHT BEARING RESTRICTIONS No  PAIN:  Are you having pain? No but has discomfort  PLOF: Independent, retired, has not driven past year due to ankle, ballroom dancing  PATIENT GOALS get back to 100%  OBJECTIVE:   HAND DOMINANCE: Right  TODAY'S TREATMENT  In  supine, AAROM shoulder flex with min facilitation for proper positioning/alignment.  Closed-chain shoulder flex and chest press with PVC frame with min cueing for positioning/avoid compensation.  AAROM shoulder ER.    In supine, gentle joint mobs/stretch to shoulder.   In sitting, closed-chain shoulder flex with min cueing for proper positionng.  In standing, AAROM shoulder flex with UE ranger with min cueing for positioning/avoid shoulder compensation.  Then, scapular retraction with cane with shoulder ext (closed-chain) with min cueing.   PATIENT EDUCATION: Education details: Emphasized quality over quantity of HEP and recommended pt only perform resistive  exercises every other day (band, weighted exercises) and perform ROM exercises daily with good positioning and resting as needed (and can utilize use of mirror and husband for feedback).  Also discussed use of heat prior to exercise and ice after exercise prn. Person educated: Patient and Spouse Education method: Explanation Education comprehension: verbalized understanding    HOME EXERCISE PROGRAM: 08/15/21: coordination and putty HEP for Rt hand 08/22/21: HEP for scapula/sh girdle 08/24/21: Theraband HEP   GOALS: Goals reviewed with patient? Yes  LONG TERM GOALS: Target date: 09/09/21  Independent w/ HEP for Rt shoulder ROM/strengthening, grip strengthening, and high level coordination HEP  Baseline:  Goal status: MET  2.  Pt to simulate cooking task and laundry tasks safely using rollator Baseline:  Goal status: MET  3.  Pt to demo Rt shoulder flexion sufficient for reaching into higher cabinets for light objects Goal status: REVISED  4.  Improve Rt hand coordination as evidenced by performing 9 hole peg test in 24 sec or less Baseline: 28.72 sec Goal status: IN PROGRESS  ASSESSMENT:  CLINICAL IMPRESSION: Pt reports discomfort in R shoulder at times and is concerned about exercises aggravating arthritis.  Discussed  reducing frequency of resistive exercises and focus on positioning.    PERFORMANCE DEFICITS in functional skills including IADLs, coordination, dexterity, ROM, strength, mobility, balance, body mechanics, decreased knowledge of use of DME, and UE functional use,.   IMPAIRMENTS are limiting patient from IADLs, leisure, and social participation.   COMORBIDITIES may have co-morbidities  that affects occupational performance. Patient will benefit from skilled OT to address above impairments and improve overall function.  MODIFICATION OR ASSISTANCE TO COMPLETE EVALUATION: No modification of tasks or assist necessary to complete an evaluation.  OT OCCUPATIONAL PROFILE  AND HISTORY: Problem focused assessment: Including review of records relating to presenting problem.  CLINICAL DECISION MAKING: Moderate - several treatment options, min-mod task modification necessary  REHAB POTENTIAL: Good  EVALUATION COMPLEXITY: Low  PLAN: OT FREQUENCY: 2x/week  OT DURATION: 4 weeks (ADDITIONAL) prn beginning 09/11/21 - 10/12/21  PLANNED INTERVENTIONS: self care/ADL training, therapeutic exercise, therapeutic activity, neuromuscular re-education, functional mobility training, patient/family education, coping strategies training, and DME and/or AE instructions  RECOMMENDED OTHER SERVICES: none  CONSULTED AND AGREED WITH PLAN OF CARE: Patient and family member/caregiver  PLAN FOR NEXT SESSION: continue to work on light strengthening as tolerated and reaching RUE w/ decreased compensations initiating movement w/ scapula depression, progress towards remaining 2 goals--possible d/c in next 1-2 visits.  Debanhi Blaker, OTR/L 09/12/2021, 10:23 AM

## 2021-09-12 ENCOUNTER — Ambulatory Visit: Payer: Medicare Other | Admitting: Physical Therapy

## 2021-09-12 ENCOUNTER — Encounter: Payer: Self-pay | Admitting: Occupational Therapy

## 2021-09-12 ENCOUNTER — Ambulatory Visit: Payer: Medicare Other | Admitting: Occupational Therapy

## 2021-09-12 DIAGNOSIS — R262 Difficulty in walking, not elsewhere classified: Secondary | ICD-10-CM

## 2021-09-12 DIAGNOSIS — R278 Other lack of coordination: Secondary | ICD-10-CM | POA: Diagnosis not present

## 2021-09-12 DIAGNOSIS — R2681 Unsteadiness on feet: Secondary | ICD-10-CM

## 2021-09-12 DIAGNOSIS — M6281 Muscle weakness (generalized): Secondary | ICD-10-CM

## 2021-09-12 NOTE — Therapy (Signed)
OUTPATIENT PHYSICAL THERAPY NEURO TREATMENT   Patient Name: Monica Hinton MRN: [id number] DOB:[birth year], 76 y.o., female Today's Date: 09/12/2021   PCP: Orpah Melter, MD REFERRING PROVIDER: Elodia Florence., MD   Physical Therapy Progress Note   Dates of Reporting Period: 08/09/2021 - 09/12/2021  See Note below for Objective Data and Assessment of Progress/Goals.  Thank you for the referral of this patient. Excell Seltzer, PT, DPT, CSRS    PT End of Session - 09/12/21 [phone]     Visit Number 10    Number of Visits 21    Date for PT Re-Evaluation 10/18/21    Authorization Type [employer] medicare    Progress Note Due on Visit 10    PT Start Time 0930    PT Stop Time 1015    PT Time Calculation (min) 45 min    Equipment Utilized During Treatment Gait belt    Activity Tolerance Patient tolerated treatment well    Behavior During Therapy WFL for tasks assessed/performed               Past Medical History:  Diagnosis Date   Allergic rhinitis    Asthma    seasonal    Bursitis    r hip    GERD (gastroesophageal reflux disease)    High cholesterol    Hypercholesteremia    Hypertension    Hypothyroidism    Hypothyroidism    Insomnia    Leukocytosis    Obesity    Peripheral vascular disease (HCC)    bilateral LE edema ; edma in hands as well    PONV (postoperative nausea and vomiting)    Thrombocytosis    Vitamin D deficiency    Past Surgical History:  Procedure Laterality Date   bladder      bladder tack  20 years ago    COLONOSCOPY     EYE SURGERY Bilateral    catracts   hysterectomy      20 years    LUMBAR FUSION     OPEN SURGICAL REPAIR OF GLUTEAL TENDON Right 09/19/2016   Procedure: Right hip bursectomy and gluteal tendon repair;  Surgeon: Gaynelle Arabian, MD;  Location: [facility];  Service: Orthopedics;  Laterality: Right;   TONSILLECTOMY     age 34    TOTAL ANKLE ARTHROPLASTY Right 04/27/2021   Procedure: TOTAL ANKLE ARTHROPLASTY;  Surgeon:  Wylene Simmer, MD;  Location: [facility];  Service: Orthopedics;  Laterality: Right;   Patient Active Problem List   Diagnosis Date Noted   Syncope 08/17/2021   Leukocytosis 08/17/2021   History of CVA (cerebrovascular accident) 08/17/2021   Hormone replacement therapy 07/25/2021   Essential hypertension 07/25/2021   Dyslipidemia 07/25/2021   Asthma, chronic 07/25/2021   Acute CVA (cerebrovascular accident) ([place]) 07/24/2021   Spondylolisthesis of lumbar region 09/10/2019   Tendinopathy of right gluteus medius 09/19/2016    ONSET DATE: 07/23/21  REFERRING DIAG: 63.9 (ICD-10-CM) - Acute CVA (cerebrovascular accident) ([place])   THERAPY DIAG:  Difficulty in walking, not elsewhere classified  Muscle weakness (generalized)  Unsteadiness on feet  Other lack of coordination  Rationale for Evaluation and Treatment Rehabilitation  SUBJECTIVE:  SUBJECTIVE STATEMENT: Pt reports they got rid of the ramp at home and she was able to go up one step to get into the house with [facility]. Pt also reports she has been able to do her flight of stairs at home with railing and [facility] with no issues. Pt reports mainly using the [facility] during the day and the rollator at night when she is more fatigued or to get to the bathroom in the middle of the night.  Pt accompanied by: significant other [name]  PERTINENT HISTORY: HTN,HLD,hypothyroidism,asthma PVD chronic b/l lower extremity edema, recent ankle replacement ~04/2021  PAIN:  Are you having pain? No  PRECAUTIONS: Fall  WEIGHT BEARING RESTRICTIONS No   TODAY'S TREATMENT:   THER ACT:   [facility] PT Assessment - 09/12/21 0936       Ambulation/Gait   Gait velocity 32.8 ft over 11.97 sec = 2.74 ft/sec   32.8 ft over 12.65 sec = 2.59 ft/sec     Functional Gait   Assessment   Gait assessed  Yes    Gait Level Surface Walks 20 ft in less than 7 sec but greater than 5.5 sec, uses assistive device, slower speed, mild gait deviations, or deviates 6-10 in outside of the 12 in walkway width.    Change in Gait Speed Able to change speed, demonstrates mild gait deviations, deviates 6-10 in outside of the 12 in walkway width, or no gait deviations, unable to achieve a major change in velocity, or uses a change in velocity, or uses an assistive device.    Gait with Horizontal Head Turns Performs head turns smoothly with slight change in gait velocity (eg, minor disruption to smooth gait path), deviates 6-10 in outside 12 in walkway width, or uses an assistive device.    Gait with Vertical Head Turns Performs task with slight change in gait velocity (eg, minor disruption to smooth gait path), deviates 6 - 10 in outside 12 in walkway width or uses assistive device    Gait and Pivot Turn Turns slowly, requires verbal cueing, or requires several small steps to catch balance following turn and stop    Step Over Obstacle Is able to step over one shoe box (4.5 in total height) but must slow down and adjust steps to clear box safely. May require verbal cueing.    Gait with Narrow Base of Support Ambulates less than 4 steps heel to toe or cannot perform without assistance.    Gait with Eyes Closed Walks 20 ft, slow speed, abnormal gait pattern, evidence for imbalance, deviates 10-15 in outside 12 in walkway width. Requires more than 9 sec to ambulate 20 ft.    Ambulating Backwards Walks 20 ft, uses assistive device, slower speed, mild gait deviations, deviates 6-10 in outside 12 in walkway width.    Steps Two feet to a stair, must use rail.    Total Score 14    FGA comment: 14/30, high fall risk            At stairs: -12" step-taps with RLE, 2 x 15 reps with focus on decreased hip compensations -attempted 6" step-down at stairs with LLE, too difficult due to step height  and limited R ankle ROM  In // bars: -attempted 4" step-down, still too much for RLE -2" step-down 2 x 15 reps -added step-downs to HEP, see bolded below   GAIT:  STAIRS:  Level of Assistance: CGA  Stair Negotiation Technique: Step to Pattern With use of AD: SBQC  with No  Rails  Number of Stairs: 12   Height of Stairs: 6  Comments: Pt reports some difficulty lifting R heel when ascending/descending stairs and that it feels "heavy", feeling increases with onset of fatigue    PATIENT EDUCATION: Education details: continue HEP Person educated: Patient and Spouse Education method: Explanation, Demonstration, and Handouts Education comprehension: verbalized understanding   HOME EXERCISE PROGRAM: Access Code: [email or web address] URL: [email or web address] Date: 08/11/2021 Prepared by: Excell Seltzer  Exercises - Heel Raises with Counter Support  - 1 x daily - 7 x weekly - 3 sets - 10 reps - Long Sitting Ankle Plantar Flexion with Resistance  - 1 x daily - 7 x weekly - 3 sets - 10 reps - Long Sitting Ankle Dorsiflexion with Anchored Resistance  - 1 x daily - 7 x weekly - 3 sets - 10 reps - Alternating Step Taps with Counter Support  - 1 x daily - 7 x weekly - 3 sets - 10 reps - Seated Ankle Alphabet  - 1 x daily - 7 x weekly - 3 sets - 10 reps - Ankle Circles on Wobble Board in Sitting  - 1 x daily - 7 x weekly - 3 sets - 10 reps - Seated Calf Raise on Step  - 1 x daily - 7 x weekly - 3 sets - 10 reps countertop pt practices weight-shift in stride position alt leading foot - Forward Step Down with Heel Tap and Counter Support  - 1 x daily - 7 x weekly - 3 sets - 10 reps  GOALS: Goals reviewed with patient? Yes  SHORT TERM GOALS: Target date: 09/13/2021  Pt will be independent with initial HEP for improved balance and gait mechanics  Baseline: to be provided Goal status: MET  2.  Pt will improve FGA to >/= 12/30 to demonstrate improved balance and reduced fall  risk  Baseline: 7/30, 14/30 (8/29) Goal status: MET  3.  Pt will improve gait speed to >/= 1.61m/s to demonstrate improved community ambulation  Baseline: .68m/s with rollator, .81m/s with rollator  Goal status: IN PROGRESS   LONG TERM GOALS: Target date: 10/18/2021  Pt will be independent with final HEP for improved balance and gait mechanics   Baseline: to be provided Goal status: INITIAL  2.  Pt will improve FGA to >/= 17/30 to demonstrate improved balance and reduced fall risk  Baseline: 7/30, 14/30 (8/29) Goal status: INITIAL  3.  Pt will improve gait speed to >/= 1.55m/s to demonstrate improved community ambulation  Baseline: .24m/s with rollator, .23m/s with rollator  Goal status: REVISED  ASSESSMENT:  CLINICAL IMPRESSION: Emphasis of skilled PT session on reassessing STG including gait speed and FGA. Pt has met 2/3 STG due to being independent with her HEP, improving her FGA score from 7/30 to 14/30, and has made progress towards her final STG due to an improvement in gait speed from 0.29 m/s with her rollator to 0.84 m/s. Pt remains a high fall risk due to her FGA score but has shown significant improvement from her initial evaluation. Pt continues to benefit from skilled therapy services to address balance and RLE strength impairments. Continue POC.   OBJECTIVE IMPAIRMENTS Abnormal gait, decreased activity tolerance, decreased balance, decreased endurance, decreased knowledge of condition, decreased knowledge of use of DME, difficulty walking, and decreased strength.   ACTIVITY LIMITATIONS carrying, lifting, bending, standing, squatting, stairs, and locomotion level  PARTICIPATION LIMITATIONS: meal prep, cleaning, laundry, driving, shopping, and community activity  PERSONAL FACTORS Age, Fitness, Past/current experiences,  and 3+ comorbidities: HTN HLD, PVD, LE edema, recent R ankle replacement   are also affecting patient's functional outcome.   REHAB POTENTIAL:  Good  CLINICAL DECISION MAKING: Stable/uncomplicated  EVALUATION COMPLEXITY: Low  PLAN: PT FREQUENCY: 2x/week  PT DURATION: 10 weeks  PLANNED INTERVENTIONS: Therapeutic exercises, Therapeutic activity, Neuromuscular re-education, Balance training, Gait training, Patient/Family education, Self Care, Joint mobilization, Joint manipulation, Stair training, Vestibular training, Visual/preceptual remediation/compensation, Orthotic/Fit training, DME instructions, Aquatic Therapy, Dry Needling, Cognitive remediation, Electrical stimulation, Spinal manipulation, Spinal mobilization, Cryotherapy, Moist heat, Manual lymph drainage, scar mobilization, Taping, Manual therapy, and Re-evaluation  PLAN FOR NEXT SESSION: continue to work on balance, gait mechanics, R ankle strengthening, eccentric step-downs, increasing stance time on RLE, R hip strengthening, continue stairs w/ quad rubber tip SPC no rail-descent, airex stair taps on RLE, continue hurdles 4", SLS on airex RLE, may need to practice cane on gravel-pt has packed gravel driveway and is nervous about this, static balance-EC (add to HEP?)   Excell Seltzer, PT, DPT, CSRS  09/12/2021, 10:18 AM

## 2021-09-14 ENCOUNTER — Ambulatory Visit: Payer: Medicare Other

## 2021-09-14 DIAGNOSIS — R262 Difficulty in walking, not elsewhere classified: Secondary | ICD-10-CM

## 2021-09-14 DIAGNOSIS — M6281 Muscle weakness (generalized): Secondary | ICD-10-CM | POA: Diagnosis not present

## 2021-09-14 DIAGNOSIS — R2681 Unsteadiness on feet: Secondary | ICD-10-CM

## 2021-09-14 DIAGNOSIS — R278 Other lack of coordination: Secondary | ICD-10-CM

## 2021-09-14 NOTE — Therapy (Signed)
OUTPATIENT PHYSICAL THERAPY NEURO TREATMENT   Patient Name: Monica Hinton MRN: [id number] DOB:11/26/1945, 76 y.o., female Today's Date: 09/14/2021   PCP: Stephen Meyers, MD REFERRING PROVIDER: Powell, A Caldwell Jr., MD     PT End of Session - 09/14/21 0921     Visit Number 11    Number of Visits 21    Date for PT Re-Evaluation 10/18/21    Authorization Type [employer] medicare    Progress Note Due on Visit 20    PT Start Time 0930    PT Stop Time 1012    PT Time Calculation (min) 42 min    Activity Tolerance Patient tolerated treatment well    Behavior During Therapy WFL for tasks assessed/performed             Past Medical History:  Diagnosis Date   Allergic rhinitis    Asthma    seasonal    Bursitis    r hip    GERD (gastroesophageal reflux disease)    High cholesterol    Hypercholesteremia    Hypertension    Hypothyroidism    Hypothyroidism    Insomnia    Leukocytosis    Obesity    Peripheral vascular disease (HCC)    bilateral LE edema ; edma in hands as well    PONV (postoperative nausea and vomiting)    Thrombocytosis    Vitamin D deficiency    Past Surgical History:  Procedure Laterality Date   bladder      bladder tack  20 years ago    COLONOSCOPY     EYE SURGERY Bilateral    catracts   hysterectomy      20 years    LUMBAR FUSION     OPEN SURGICAL REPAIR OF GLUTEAL TENDON Right 09/19/2016   Procedure: Right hip bursectomy and gluteal tendon repair;  Surgeon: Aluisio, Frank, MD;  Location: [facility];  Service: Orthopedics;  Laterality: Right;   TONSILLECTOMY     age 18    TOTAL ANKLE ARTHROPLASTY Right 04/27/2021   Procedure: TOTAL ANKLE ARTHROPLASTY;  Surgeon: Hewitt, [name], MD;  Location: [facility];  Service: Orthopedics;  Laterality: Right;   Patient Active Problem List   Diagnosis Date Noted   Syncope 08/17/2021   Leukocytosis 08/17/2021   History of CVA (cerebrovascular accident) 08/17/2021   Hormone replacement therapy  07/25/2021   Essential hypertension 07/25/2021   Dyslipidemia 07/25/2021   Asthma, chronic 07/25/2021   Acute CVA (cerebrovascular accident) (HCC) 07/24/2021   Spondylolisthesis of lumbar region 09/10/2019   Tendinopathy of right gluteus medius 09/19/2016    ONSET DATE: 07/23/21  REFERRING DIAG: 63.9 (ICD-10-CM) - Acute CVA (cerebrovascular accident) (HCC)   THERAPY DIAG:  Muscle weakness (generalized)  Other lack of coordination  Difficulty in walking, not elsewhere classified  Unsteadiness on feet  Rationale for Evaluation and Treatment Rehabilitation  SUBJECTIVE:                                                                                                                                                                                               SUBJECTIVE STATEMENT: Patient reports doing well. Has been practicing stairs at home without issue.   Pt accompanied by: significant other [name]  PERTINENT HISTORY: HTN,HLD,hypothyroidism,asthma PVD chronic b/l lower extremity edema, recent ankle replacement ~04/2021  PAIN:  Are you having pain? No  PRECAUTIONS: Fall  WEIGHT BEARING RESTRICTIONS No   TODAY'S TREATMENT:  NMR:  -SLS on R LE tapping cone with L foot B UE support -> no UE support  -rockerboard A/P toe/heel tap  -step over/back with R LE on rockerboard (increased pain with increased DF)  -step over/back with R LE on Airex  -clamshell with red theraband 2x15    PATIENT EDUCATION: Education details: continue HEP Person educated: Patient and Spouse Education method: Explanation, Demonstration, and Handouts Education comprehension: verbalized understanding   HOME EXERCISE PROGRAM: Access Code: [email or web address] URL: [email or web address] Date: 09/14/2021 Prepared by: Jennifer Novak  Exercises - Heel Raises with Counter Support  - 1 x daily - 7 x weekly - 3 sets - 10 reps - Alternating Step Taps with Counter Support  - 1 x daily - 7 x weekly - 3  sets - 10 reps - Seated Ankle Alphabet  - 1 x daily - 7 x weekly - 3 sets - 10 reps - Ankle Circles on Wobble Board in Sitting  - 1 x daily - 7 x weekly - 3 sets - 10 reps - Seated Calf Raise on Step  - 1 x daily - 7 x weekly - 3 sets - 10 reps - Forward Step Down with Heel Tap and Counter Support  - 1 x daily - 7 x weekly - 3 sets - 10 reps - Clamshell with Resistance  - 1 x daily - 7 x weekly - 3 sets - 10 reps  GOALS: Goals reviewed with patient? Yes  SHORT TERM GOALS: Target date: 09/13/2021  Pt will be independent with initial HEP for improved balance and gait mechanics  Baseline: to be provided Goal status: MET  2.  Pt will improve FGA to >/= 12/30 to demonstrate improved balance and reduced fall risk  Baseline: 7/30, 14/30 (8/29) Goal status: MET  3.  Pt will improve gait speed to >/= 1.0m/s to demonstrate improved community ambulation  Baseline: .29m/s with rollator, .84m/s with rollator  Goal status: IN PROGRESS   LONG TERM GOALS: Target date: 10/18/2021  Pt will be independent with final HEP for improved balance and gait mechanics   Baseline: to be provided Goal status: INITIAL  2.  Pt will improve FGA to >/= 17/30 to demonstrate improved balance and reduced fall risk  Baseline: 7/30, 14/30 (8/29) Goal status: INITIAL  3.  Pt will improve gait speed to >/= 1.2m/s to demonstrate improved community ambulation  Baseline: .29m/s with rollator, .84m/s with rollator  Goal status: REVISED  ASSESSMENT:  CLINICAL IMPRESSION: Patient seen for skilled PT session with emphasis on R LE NMR. Patient with decreased confidence with full weight shift R, though this is improving. Weak R lateral hip stabilizers impacting patients stability in SLS or modified SLS on that side. Improving trendelenburg gait noted as well. Discussed expected slight inflammatory response after therapy or a lot of activity with patient and husband, both verbalized understanding. Continue POC.     OBJECTIVE IMPAIRMENTS Abnormal gait, decreased activity tolerance, decreased balance, decreased endurance, decreased knowledge of condition, decreased knowledge of use of DME, difficulty walking, and decreased strength.   ACTIVITY LIMITATIONS carrying, lifting, bending, standing, squatting, stairs, and locomotion level  PARTICIPATION LIMITATIONS: meal prep, cleaning, laundry, driving,   shopping, and community activity  PERSONAL FACTORS Age, Fitness, Past/current experiences, and 3+ comorbidities: HTN HLD, PVD, LE edema, recent R ankle replacement   are also affecting patient's functional outcome.   REHAB POTENTIAL: Good  CLINICAL DECISION MAKING: Stable/uncomplicated  EVALUATION COMPLEXITY: Low  PLAN: PT FREQUENCY: 2x/week  PT DURATION: 10 weeks  PLANNED INTERVENTIONS: Therapeutic exercises, Therapeutic activity, Neuromuscular re-education, Balance training, Gait training, Patient/Family education, Self Care, Joint mobilization, Joint manipulation, Stair training, Vestibular training, Visual/preceptual remediation/compensation, Orthotic/Fit training, DME instructions, Aquatic Therapy, Dry Needling, Cognitive remediation, Electrical stimulation, Spinal manipulation, Spinal mobilization, Cryotherapy, Moist heat, Manual lymph drainage, scar mobilization, Taping, Manual therapy, and Re-evaluation  PLAN FOR NEXT SESSION: continue to work on balance, gait mechanics, R ankle strengthening, eccentric step-downs, increasing stance time on RLE, R hip strengthening, continue stairs w/ quad rubber tip SPC no rail-descent, airex stair taps on RLE, continue hurdles 4", SLS on airex RLE, may need to practice cane on gravel-pt has packed gravel driveway and is nervous about this, static balance-EC (add to HEP?)   Jennifer A Novak, PT, DPT Jennifer A Novak, PT, DPT, CBIS   09/14/2021, 10:15 AM 

## 2021-09-19 ENCOUNTER — Encounter: Payer: Self-pay | Admitting: Physical Therapy

## 2021-09-19 ENCOUNTER — Ambulatory Visit: Payer: Medicare Other | Attending: Family Medicine | Admitting: Physical Therapy

## 2021-09-19 DIAGNOSIS — M6281 Muscle weakness (generalized): Secondary | ICD-10-CM | POA: Diagnosis not present

## 2021-09-19 DIAGNOSIS — R2681 Unsteadiness on feet: Secondary | ICD-10-CM | POA: Insufficient documentation

## 2021-09-19 DIAGNOSIS — R278 Other lack of coordination: Secondary | ICD-10-CM | POA: Diagnosis not present

## 2021-09-19 DIAGNOSIS — R262 Difficulty in walking, not elsewhere classified: Secondary | ICD-10-CM | POA: Insufficient documentation

## 2021-09-19 NOTE — Therapy (Signed)
OUTPATIENT PHYSICAL THERAPY NEURO TREATMENT   Patient Name: Monica Hinton MRN: [id number] DOB:1945/08/03, 76 y.o., female Today's Date: 09/19/2021   PCP: Orpah Melter, MD REFERRING PROVIDER: Elodia Florence., MD     PT End of Session - 09/19/21 0935     Visit Number 12    Number of Visits 21    Date for PT Re-Evaluation 10/18/21    Authorization Type [employer] medicare    Progress Note Due on Visit 20    PT Start Time 0931    PT Stop Time 1015    PT Time Calculation (min) 44 min    Equipment Utilized During Treatment Gait belt    Activity Tolerance Patient tolerated treatment well    Behavior During Therapy WFL for tasks assessed/performed             Past Medical History:  Diagnosis Date   Allergic rhinitis    Asthma    seasonal    Bursitis    r hip    GERD (gastroesophageal reflux disease)    High cholesterol    Hypercholesteremia    Hypertension    Hypothyroidism    Hypothyroidism    Insomnia    Leukocytosis    Obesity    Peripheral vascular disease ([place])    bilateral LE edema ; edma in hands as well    PONV (postoperative nausea and vomiting)    Thrombocytosis    Vitamin D deficiency    Past Surgical History:  Procedure Laterality Date   bladder      bladder tack  20 years ago    COLONOSCOPY     EYE SURGERY Bilateral    catracts   hysterectomy      20 years    LUMBAR FUSION     OPEN SURGICAL REPAIR OF GLUTEAL TENDON Right 09/19/2016   Procedure: Right hip bursectomy and gluteal tendon repair;  Surgeon: Gaynelle Arabian, MD;  Location: [facility];  Service: Orthopedics;  Laterality: Right;   TONSILLECTOMY     age 8    TOTAL ANKLE ARTHROPLASTY Right 04/27/2021   Procedure: TOTAL ANKLE ARTHROPLASTY;  Surgeon: Wylene Simmer, MD;  Location: [facility];  Service: Orthopedics;  Laterality: Right;   Patient Active Problem List   Diagnosis Date Noted   Syncope 08/17/2021   Leukocytosis 08/17/2021   History of CVA (cerebrovascular  accident) 08/17/2021   Hormone replacement therapy 07/25/2021   Essential hypertension 07/25/2021   Dyslipidemia 07/25/2021   Asthma, chronic 07/25/2021   Acute CVA (cerebrovascular accident) ([place]) 07/24/2021   Spondylolisthesis of lumbar region 09/10/2019   Tendinopathy of right gluteus medius 09/19/2016    ONSET DATE: 07/23/21  REFERRING DIAG: 63.9 (ICD-10-CM) - Acute CVA (cerebrovascular accident) ([place])   THERAPY DIAG:  Muscle weakness (generalized)  Other lack of coordination  Difficulty in walking, not elsewhere classified  Unsteadiness on feet  Rationale for Evaluation and Treatment Rehabilitation  SUBJECTIVE:  SUBJECTIVE STATEMENT: Pt states she has some soreness around ankle from doing HEP yesterday, but HEP is going well.  Pt accompanied by: significant other [name]  PERTINENT HISTORY: HTN,HLD,hypothyroidism,asthma PVD chronic b/l lower extremity edema, recent ankle replacement ~04/2021  PAIN:  Are you having pain? No-would not consider her ankle soreness to be painful  PRECAUTIONS: Fall  WEIGHT BEARING RESTRICTIONS No   TODAY'S TREATMENT:  -Standing calf raises 2x12; performed gastroc stretch on stairs 3x45 sec w/ BUE in between sets; added stretch to HEP  -SLS at counter working on hip abd engagement w/ mod multimodal cuing and progressing to no UE support when in left stance and unilateral UE support (LUE) in RLE stance -Step over 4" hurdle alt LE w/ rocking push back using progressively dec UE support, pt reluctant to eliminate all UE support  > lateral step over w/ push back for glut engagement x20 each side, pt unable to dec UE support due to hip musculature fatigue.  PATIENT EDUCATION: Education details: Continue HEP and walking with quad tip cane.  Discussed resuming ankle  scar desensitization techniques from prior therapy episode. Person educated: Patient and Spouse Education method: Explanation, Demonstration, and Handouts Education comprehension: verbalized understanding   HOME EXERCISE PROGRAM: Access Code: [email or web address] URL: [email or web address] Date: 09/14/2021 Prepared by: Estevan Ryder  Exercises - Heel Raises with Counter Support  - 1 x daily - 7 x weekly - 3 sets - 10 reps - Alternating Step Taps with Counter Support  - 1 x daily - 7 x weekly - 3 sets - 10 reps - Seated Ankle Alphabet  - 1 x daily - 7 x weekly - 3 sets - 10 reps - Ankle Circles on Wobble Board in Sitting  - 1 x daily - 7 x weekly - 3 sets - 10 reps - Seated Calf Raise on Step  - 1 x daily - 7 x weekly - 3 sets - 10 reps - Forward Step Down with Heel Tap and Counter Support  - 1 x daily - 7 x weekly - 3 sets - 10 reps - Clamshell with Resistance  - 1 x daily - 7 x weekly - 3 sets - 10 reps - Standing Gastroc Stretch on Step with Counter Support  - 1 x daily - 7 x weekly - 1 sets - 3-4 reps - 30-45 seconds hold  GOALS: Goals reviewed with patient? Yes  SHORT TERM GOALS: Target date: 09/13/2021  Pt will be independent with initial HEP for improved balance and gait mechanics  Baseline: to be provided Goal status: MET  2.  Pt will improve FGA to >/= 12/30 to demonstrate improved balance and reduced fall risk  Baseline: 7/30, 14/30 (8/29) Goal status: MET  3.  Pt will improve gait speed to >/= 1.48m/s to demonstrate improved community ambulation  Baseline: .67m/s with rollator, .34m/s with rollator  Goal status: IN PROGRESS   LONG TERM GOALS: Target date: 10/18/2021  Pt will be independent with final HEP for improved balance and gait mechanics   Baseline: to be provided Goal status: INITIAL  2.  Pt will improve FGA to >/= 17/30 to demonstrate improved balance and reduced fall risk  Baseline: 7/30, 14/30 (8/29) Goal status: INITIAL  3.  Pt will improve  gait speed to >/= 1.35m/s to demonstrate improved community ambulation  Baseline: .79m/s with rollator, .73m/s with rollator  Goal status: REVISED  ASSESSMENT:  CLINICAL IMPRESSION: Continued to target ankle mobility at onset of session making minor addition to HEP to  gain ROM in standing heel raises.  Pt demonstrates continued functional calf and hip weakness, right worse than left.  She remains motivated and compliant in home setting and continues to demonstrate progress towards LTGs with ongoing PT POC.   OBJECTIVE IMPAIRMENTS Abnormal gait, decreased activity tolerance, decreased balance, decreased endurance, decreased knowledge of condition, decreased knowledge of use of DME, difficulty walking, and decreased strength.   ACTIVITY LIMITATIONS carrying, lifting, bending, standing, squatting, stairs, and locomotion level  PARTICIPATION LIMITATIONS: meal prep, cleaning, laundry, driving, shopping, and community activity  PERSONAL FACTORS Age, Fitness, Past/current experiences, and 3+ comorbidities: HTN HLD, PVD, LE edema, recent R ankle replacement   are also affecting patient's functional outcome.   REHAB POTENTIAL: Good  CLINICAL DECISION MAKING: Stable/uncomplicated  EVALUATION COMPLEXITY: Low  PLAN: PT FREQUENCY: 2x/week  PT DURATION: 10 weeks  PLANNED INTERVENTIONS: Therapeutic exercises, Therapeutic activity, Neuromuscular re-education, Balance training, Gait training, Patient/Family education, Self Care, Joint mobilization, Joint manipulation, Stair training, Vestibular training, Visual/preceptual remediation/compensation, Orthotic/Fit training, DME instructions, Aquatic Therapy, Dry Needling, Cognitive remediation, Electrical stimulation, Spinal manipulation, Spinal mobilization, Cryotherapy, Moist heat, Manual lymph drainage, scar mobilization, Taping, Manual therapy, and Re-evaluation  PLAN FOR NEXT SESSION: continue to work on balance, gait mechanics, R ankle strengthening,  eccentric step-downs, increasing stance time on RLE, R hip strengthening, continue stairs w/ quad rubber tip SPC no rail-descent, airex stair taps on RLE, 8" hurdles, SLS on airex RLE, may need to practice cane on gravel-pt has packed gravel driveway and is nervous about this, static balance-EC (add to HEP?), discuss setup of pt getting into her high bed/practice w/ step, add SLS on firm ground with emphasis on abd control.   Bary Richard, PT, DPT   09/19/2021, 1:30 PM

## 2021-09-19 NOTE — Patient Instructions (Signed)
Access Code: [email or web address] URL: [email or web address] Date: 09/19/2021 Prepared by: [place] with Counter Support  - 1 x daily - 7 x weekly - 3 sets - 10 reps - Alternating Step Taps with Counter Support  - 1 x daily - 7 x weekly - 3 sets - 10 reps - Seated Ankle Alphabet  - 1 x daily - 7 x weekly - 3 sets - 10 reps - Ankle Circles on Wobble Board in Sitting  - 1 x daily - 7 x weekly - 3 sets - 10 reps - Seated Calf Raise on Step  - 1 x daily - 7 x weekly - 3 sets - 10 reps - Forward Step Down with Heel Tap and Counter Support  - 1 x daily - 7 x weekly - 3 sets - 10 reps - Clamshell with Resistance  - 1 x daily - 7 x weekly - 3 sets - 10 reps - Standing Gastroc Stretch on Step with Counter Support  - 1 x daily - 7 x weekly - 1 sets - 3-4 reps - 30-45 seconds hold

## 2021-09-21 ENCOUNTER — Ambulatory Visit: Payer: Medicare Other

## 2021-09-21 DIAGNOSIS — R2681 Unsteadiness on feet: Secondary | ICD-10-CM

## 2021-09-21 DIAGNOSIS — M6281 Muscle weakness (generalized): Secondary | ICD-10-CM

## 2021-09-21 DIAGNOSIS — R278 Other lack of coordination: Secondary | ICD-10-CM | POA: Diagnosis not present

## 2021-09-21 DIAGNOSIS — R262 Difficulty in walking, not elsewhere classified: Secondary | ICD-10-CM

## 2021-09-21 NOTE — Therapy (Signed)
OUTPATIENT PHYSICAL THERAPY NEURO TREATMENT   Patient Name: Monica Hinton MRN: [id number] DOB:11-25-1945, 76 y.o., female Today's Date: 09/21/2021   PCP: Orpah Melter, MD REFERRING PROVIDER: Elodia Florence., MD     PT End of Session - 09/21/21 [phone]     Visit Number 13    Number of Visits 21    Date for PT Re-Evaluation 10/18/21    Authorization Type [employer] medicare    Progress Note Due on Visit 20    PT Start Time 0930    PT Stop Time 1014    PT Time Calculation (min) 44 min    Activity Tolerance Patient tolerated treatment well    Behavior During Therapy WFL for tasks assessed/performed             Past Medical History:  Diagnosis Date   Allergic rhinitis    Asthma    seasonal    Bursitis    r hip    GERD (gastroesophageal reflux disease)    High cholesterol    Hypercholesteremia    Hypertension    Hypothyroidism    Hypothyroidism    Insomnia    Leukocytosis    Obesity    Peripheral vascular disease ([place])    bilateral LE edema ; edma in hands as well    PONV (postoperative nausea and vomiting)    Thrombocytosis    Vitamin D deficiency    Past Surgical History:  Procedure Laterality Date   bladder      bladder tack  20 years ago    COLONOSCOPY     EYE SURGERY Bilateral    catracts   hysterectomy      20 years    LUMBAR FUSION     OPEN SURGICAL REPAIR OF GLUTEAL TENDON Right 09/19/2016   Procedure: Right hip bursectomy and gluteal tendon repair;  Surgeon: Gaynelle Arabian, MD;  Location: [facility];  Service: Orthopedics;  Laterality: Right;   TONSILLECTOMY     age 53    TOTAL ANKLE ARTHROPLASTY Right 04/27/2021   Procedure: TOTAL ANKLE ARTHROPLASTY;  Surgeon: Wylene Simmer, MD;  Location: [facility];  Service: Orthopedics;  Laterality: Right;   Patient Active Problem List   Diagnosis Date Noted   Syncope 08/17/2021   Leukocytosis 08/17/2021   History of CVA (cerebrovascular accident) 08/17/2021   Hormone replacement therapy  07/25/2021   Essential hypertension 07/25/2021   Dyslipidemia 07/25/2021   Asthma, chronic 07/25/2021   Acute CVA (cerebrovascular accident) ([place]) 07/24/2021   Spondylolisthesis of lumbar region 09/10/2019   Tendinopathy of right gluteus medius 09/19/2016    ONSET DATE: 07/23/21  REFERRING DIAG: 63.9 (ICD-10-CM) - Acute CVA (cerebrovascular accident) ([place])   THERAPY DIAG:  Muscle weakness (generalized)  Other lack of coordination  Difficulty in walking, not elsewhere classified  Unsteadiness on feet  Rationale for Evaluation and Treatment Rehabilitation  SUBJECTIVE:  SUBJECTIVE STATEMENT: Patient reports doing well. Does have some soreness to her ankle. Is going to the gym when no tin therapy. Step downs and heel raises cause the most discomfort.   Pt accompanied by: significant other [name]  PERTINENT HISTORY: HTN,HLD,hypothyroidism,asthma PVD chronic b/l lower extremity edema, recent ankle replacement ~04/2021  PAIN:  Are you having pain? No-would not consider her ankle soreness to be painful  PRECAUTIONS: Fall  WEIGHT BEARING RESTRICTIONS No   TODAY'S TREATMENT:  Manual: -scar mobilization  -scar desensitization -grade I/II joint mobilization into DF  Theract:  -stairs x5 reciprocal stepping -foam balance beam lateral stepping/ tandem walking  -lateral resisted stepping, no UE support   PATIENT EDUCATION: Education details: Continue HEP and walking with quad tip cane. Person educated: Patient and Spouse Education method: Explanation, Demonstration, and Handouts Education comprehension: verbalized understanding   HOME EXERCISE PROGRAM: Access Code: [email or web address] URL: [email or web address] Date: 09/14/2021 Prepared by: Estevan Ryder  Exercises - Heel Raises with  Counter Support  - 1 x daily - 7 x weekly - 3 sets - 10 reps - Alternating Step Taps with Counter Support  - 1 x daily - 7 x weekly - 3 sets - 10 reps - Seated Ankle Alphabet  - 1 x daily - 7 x weekly - 3 sets - 10 reps - Ankle Circles on Wobble Board in Sitting  - 1 x daily - 7 x weekly - 3 sets - 10 reps - Seated Calf Raise on Step  - 1 x daily - 7 x weekly - 3 sets - 10 reps - Forward Step Down with Heel Tap and Counter Support  - 1 x daily - 7 x weekly - 3 sets - 10 reps - Clamshell with Resistance  - 1 x daily - 7 x weekly - 3 sets - 10 reps - Standing Gastroc Stretch on Step with Counter Support  - 1 x daily - 7 x weekly - 1 sets - 3-4 reps - 30-45 seconds hold  GOALS: Goals reviewed with patient? Yes  SHORT TERM GOALS: Target date: 09/13/2021  Pt will be independent with initial HEP for improved balance and gait mechanics  Baseline: to be provided Goal status: MET  2.  Pt will improve FGA to >/= 12/30 to demonstrate improved balance and reduced fall risk  Baseline: 7/30, 14/30 (8/29) Goal status: MET  3.  Pt will improve gait speed to >/= 1.56m/s to demonstrate improved community ambulation  Baseline: .77m/s with rollator, .15m/s with rollator  Goal status: IN PROGRESS   LONG TERM GOALS: Target date: 10/18/2021  Pt will be independent with final HEP for improved balance and gait mechanics   Baseline: to be provided Goal status: INITIAL  2.  Pt will improve FGA to >/= 17/30 to demonstrate improved balance and reduced fall risk  Baseline: 7/30, 14/30 (8/29) Goal status: INITIAL  3.  Pt will improve gait speed to >/= 1.64m/s to demonstrate improved community ambulation  Baseline: .35m/s with rollator, .79m/s with rollator  Goal status: REVISED  ASSESSMENT:  CLINICAL IMPRESSION: Patient seen for skilled PT session with emphasis on R ankle mobilization and ambulatory balance. Patient tolerating manual therapy well. Discussed benefits of scar mobilization, especially  since scar crosses joint line. Patient and spouse verbalized understanding. Patient able to negotiate stairs in reciprocal pattern with minimal difficulty and verbal cues for increased R hip engagement. Patient continues to demonstrate learned nonuse of R ankle when not using AD. Continue POC.    OBJECTIVE IMPAIRMENTS Abnormal gait,  decreased activity tolerance, decreased balance, decreased endurance, decreased knowledge of condition, decreased knowledge of use of DME, difficulty walking, and decreased strength.   ACTIVITY LIMITATIONS carrying, lifting, bending, standing, squatting, stairs, and locomotion level  PARTICIPATION LIMITATIONS: meal prep, cleaning, laundry, driving, shopping, and community activity  PERSONAL FACTORS Age, Fitness, Past/current experiences, and 3+ comorbidities: HTN HLD, PVD, LE edema, recent R ankle replacement   are also affecting patient's functional outcome.   REHAB POTENTIAL: Good  CLINICAL DECISION MAKING: Stable/uncomplicated  EVALUATION COMPLEXITY: Low  PLAN: PT FREQUENCY: 2x/week  PT DURATION: 10 weeks  PLANNED INTERVENTIONS: Therapeutic exercises, Therapeutic activity, Neuromuscular re-education, Balance training, Gait training, Patient/Family education, Self Care, Joint mobilization, Joint manipulation, Stair training, Vestibular training, Visual/preceptual remediation/compensation, Orthotic/Fit training, DME instructions, Aquatic Therapy, Dry Needling, Cognitive remediation, Electrical stimulation, Spinal manipulation, Spinal mobilization, Cryotherapy, Moist heat, Manual lymph drainage, scar mobilization, Taping, Manual therapy, and Re-evaluation  PLAN FOR NEXT SESSION: continue to work on balance, gait mechanics, R ankle strengthening, eccentric step-downs, increasing stance time on RLE, R hip strengthening, continue stairs w/ quad rubber tip SPC no rail-descent, airex stair taps on RLE, 8" hurdles, SLS on airex RLE, may need to practice cane on  gravel-pt has packed gravel driveway and is nervous about this, static balance-EC (add to HEP?), discuss setup of pt getting into her high bed/practice w/ step, add SLS on firm ground with emphasis on abd control.   Debbora Dus, PT, DPT Debbora Dus, PT, DPT, CBIS    09/21/2021, 10:16 AM

## 2021-09-25 ENCOUNTER — Ambulatory Visit: Payer: Medicare Other | Admitting: Physical Therapy

## 2021-09-25 ENCOUNTER — Encounter: Payer: Medicare Other | Admitting: Occupational Therapy

## 2021-09-25 DIAGNOSIS — R262 Difficulty in walking, not elsewhere classified: Secondary | ICD-10-CM

## 2021-09-25 DIAGNOSIS — R2681 Unsteadiness on feet: Secondary | ICD-10-CM

## 2021-09-25 DIAGNOSIS — M6281 Muscle weakness (generalized): Secondary | ICD-10-CM

## 2021-09-25 DIAGNOSIS — R278 Other lack of coordination: Secondary | ICD-10-CM | POA: Diagnosis not present

## 2021-09-25 NOTE — Therapy (Signed)
OUTPATIENT PHYSICAL THERAPY NEURO TREATMENT   Patient Name: Monica Hinton MRN: [id number] DOB:1945-08-09, 76 y.o., female Today's Date: 09/25/2021   PCP: Orpah Melter, MD REFERRING PROVIDER: Elodia Florence., MD     PT End of Session - 09/25/21 1020     Visit Number 14    Number of Visits 21    Date for PT Re-Evaluation 10/18/21    Authorization Type [employer] medicare    Progress Note Due on Visit 20    PT Start Time 1016    PT Stop Time 1057    PT Time Calculation (min) 41 min    Equipment Utilized During Treatment Gait belt    Activity Tolerance Patient tolerated treatment well    Behavior During Therapy WFL for tasks assessed/performed              Past Medical History:  Diagnosis Date   Allergic rhinitis    Asthma    seasonal    Bursitis    r hip    GERD (gastroesophageal reflux disease)    High cholesterol    Hypercholesteremia    Hypertension    Hypothyroidism    Hypothyroidism    Insomnia    Leukocytosis    Obesity    Peripheral vascular disease ([place])    bilateral LE edema ; edma in hands as well    PONV (postoperative nausea and vomiting)    Thrombocytosis    Vitamin D deficiency    Past Surgical History:  Procedure Laterality Date   bladder      bladder tack  20 years ago    COLONOSCOPY     EYE SURGERY Bilateral    catracts   hysterectomy      20 years    LUMBAR FUSION     OPEN SURGICAL REPAIR OF GLUTEAL TENDON Right 09/19/2016   Procedure: Right hip bursectomy and gluteal tendon repair;  Surgeon: Gaynelle Arabian, MD;  Location: [facility];  Service: Orthopedics;  Laterality: Right;   TONSILLECTOMY     age 78    TOTAL ANKLE ARTHROPLASTY Right 04/27/2021   Procedure: TOTAL ANKLE ARTHROPLASTY;  Surgeon: Wylene Simmer, MD;  Location: [place];  Service: Orthopedics;  Laterality: Right;   Patient Active Problem List   Diagnosis Date Noted   Syncope 08/17/2021   Leukocytosis 08/17/2021   History of CVA (cerebrovascular  accident) 08/17/2021   Hormone replacement therapy 07/25/2021   Essential hypertension 07/25/2021   Dyslipidemia 07/25/2021   Asthma, chronic 07/25/2021   Acute CVA (cerebrovascular accident) ([place]) 07/24/2021   Spondylolisthesis of lumbar region 09/10/2019   Tendinopathy of right gluteus medius 09/19/2016    ONSET DATE: 07/23/21  REFERRING DIAG: 63.9 (ICD-10-CM) - Acute CVA (cerebrovascular accident) ([place])   THERAPY DIAG:  Muscle weakness (generalized)  Other lack of coordination  Difficulty in walking, not elsewhere classified  Unsteadiness on feet  Rationale for Evaluation and Treatment Rehabilitation  SUBJECTIVE:  SUBJECTIVE STATEMENT: Pt reports no pain today, she is able to do stairs with alternating gait pattern. Pt reports she is using [facility] for the most part now, just uses rollator at night.   Pt accompanied by: significant other [name]  PERTINENT HISTORY: HTN,HLD,hypothyroidism,asthma PVD chronic b/l lower extremity edema, recent ankle replacement ~04/2021  PAIN:  Are you having pain? No-would not consider her ankle soreness to be painful  PRECAUTIONS: Fall  WEIGHT BEARING RESTRICTIONS No   TODAY'S TREATMENT:   NMR: Standing 2" step-taps with LLE and SPC for balance, 2 x 10 reps with focus on increased stance time on RLE  Static stance with LLE on 2" step, 3 x 30 sec with focus on increased WB on RLE  Sit to stand with 2" step under LLE, progression to 4" step under LLE x 10 reps to encourage increased WB on RLE  Added two above exercises to HEP, see bolded below  Staggered stance with ball toss against rebounder, x 10 reps with each LE forward/back; initially attempted to perform with 0.5 kg and 1 kg weighted ball, pt unable to throw ball with enough force to be able to catch  the ball due to R shoulder issues, transitioned to unweighted ball with improved ability to retrieve ball noted, though pt does have to bend down to floor to retrieve ball with CGA for balance.  GAIT: Gait pattern: step through pattern, decreased hip/knee flexion- Right, and decreased ankle dorsiflexion- Right Distance walked: 115 ft Assistive device utilized: Single point cane "hurricane" Level of assistance: Modified independence Comments: improved with use of "hurricane" vs no AD, increased gait speed  Gait pattern: decreased hip/knee flexion- Right, decreased ankle dorsiflexion- Right, and trendelenburg Distance walked: 115 ft Assistive device utilized: None Level of assistance: SBA and CGA Comments: trial gait with no AD, "waddles", Trendelenburg due to R hip weakness   STAIRS:  Level of Assistance: Modified independence  Stair Negotiation Technique: Alternating Pattern  with Single Rail on Right  Number of Stairs: 12   Height of Stairs: 6  Comments: improved ability to perform stairs with alternating pattern this date    PATIENT EDUCATION: Education details: Continue HEP and walking with quad tip cane Person educated: Patient and Spouse Education method: Explanation, Media planner, and Handouts Education comprehension: verbalized understanding   HOME EXERCISE PROGRAM: Access Code: [email or web address] URL: [email or web address] Date: 09/14/2021 Prepared by: Estevan Ryder  Exercises - Heel Raises with Counter Support  - 1 x daily - 7 x weekly - 3 sets - 10 reps - Alternating Step Taps with Counter Support with 30 sec Hold with LLE on step  - 1 x daily - 7 x weekly - 3 sets - 10 reps - Seated Ankle Alphabet  - 1 x daily - 7 x weekly - 3 sets - 10 reps - Ankle Circles on Wobble Board in Sitting  - 1 x daily - 7 x weekly - 3 sets - 10 reps - Seated Calf Raise on Step  - 1 x daily - 7 x weekly - 3 sets - 10 reps - Forward Step Down with Heel Tap and Counter Support  -  1 x daily - 7 x weekly - 3 sets - 10 reps - Clamshell with Resistance  - 1 x daily - 7 x weekly - 3 sets - 10 reps - Standing Gastroc Stretch on Step with Counter Support  - 1 x daily - 7 x weekly - 1 sets - 3-4 reps - 30-45 seconds hold - Staggered  Sit-to-Stand  - 1 x daily - 7 x weekly - 3 sets - 10 reps  GOALS: Goals reviewed with patient? Yes  SHORT TERM GOALS: Target date: 09/13/2021  Pt will be independent with initial HEP for improved balance and gait mechanics  Baseline: to be provided Goal status: MET  2.  Pt will improve FGA to >/= 12/30 to demonstrate improved balance and reduced fall risk  Baseline: 7/30, 14/30 (8/29) Goal status: MET  3.  Pt will improve gait speed to >/= 1.25m/s to demonstrate improved community ambulation  Baseline: .7m/s with rollator, .68m/s with rollator  Goal status: IN PROGRESS   LONG TERM GOALS: Target date: 10/18/2021  Pt will be independent with final HEP for improved balance and gait mechanics   Baseline: to be provided Goal status: INITIAL  2.  Pt will improve FGA to >/= 17/30 to demonstrate improved balance and reduced fall risk  Baseline: 7/30, 14/30 (8/29) Goal status: INITIAL  3.  Pt will improve gait speed to >/= 1.17m/s to demonstrate improved community ambulation  Baseline: .39m/s with rollator, .1m/s with rollator  Goal status: REVISED  ASSESSMENT:  CLINICAL IMPRESSION: Emphasis of skilled PT session on continuing to work on gait mechanics and increasing SLS and WB on RLE. Pt continues to exhibit ongoing RLE hip, knee, and ankle weakness and decreased ROM leading to impaired gait mechanics. Pt does exhibit improved ability to navigate stairs with alternating gait pattern this date. Pt continues to benefit from skilled therapy services to increased independence with gait and balance and increase WB on RLE. Continue POC.    OBJECTIVE IMPAIRMENTS Abnormal gait, decreased activity tolerance, decreased balance, decreased  endurance, decreased knowledge of condition, decreased knowledge of use of DME, difficulty walking, and decreased strength.   ACTIVITY LIMITATIONS carrying, lifting, bending, standing, squatting, stairs, and locomotion level  PARTICIPATION LIMITATIONS: meal prep, cleaning, laundry, driving, shopping, and community activity  PERSONAL FACTORS Age, Fitness, Past/current experiences, and 3+ comorbidities: HTN HLD, PVD, LE edema, recent R ankle replacement   are also affecting patient's functional outcome.   REHAB POTENTIAL: Good  CLINICAL DECISION MAKING: Stable/uncomplicated  EVALUATION COMPLEXITY: Low  PLAN: PT FREQUENCY: 2x/week  PT DURATION: 10 weeks  PLANNED INTERVENTIONS: Therapeutic exercises, Therapeutic activity, Neuromuscular re-education, Balance training, Gait training, Patient/Family education, Self Care, Joint mobilization, Joint manipulation, Stair training, Vestibular training, Visual/preceptual remediation/compensation, Orthotic/Fit training, DME instructions, Aquatic Therapy, Dry Needling, Cognitive remediation, Electrical stimulation, Spinal manipulation, Spinal mobilization, Cryotherapy, Moist heat, Manual lymph drainage, scar mobilization, Taping, Manual therapy, and Re-evaluation  PLAN FOR NEXT SESSION: continue to work on balance, gait mechanics, R ankle strengthening, eccentric step-downs, increasing stance time on RLE, R hip strengthening, continue stairs w/ quad rubber tip SPC no rail-descent, airex stair taps on RLE, 8" hurdles, SLS on airex RLE, may need to practice cane on gravel-pt has packed gravel driveway and is nervous about this, static balance-EC (add to HEP?), discuss setup of pt getting into her high bed/practice w/ step, add SLS on firm ground with emphasis on abd control   Excell Seltzer, PT, DPT, CSRS    09/25/2021, 11:00 AM

## 2021-09-26 ENCOUNTER — Encounter: Payer: Medicare Other | Admitting: Occupational Therapy

## 2021-09-26 ENCOUNTER — Ambulatory Visit: Payer: Medicare Other | Admitting: Physical Therapy

## 2021-09-26 DIAGNOSIS — M4316 Spondylolisthesis, lumbar region: Secondary | ICD-10-CM | POA: Diagnosis not present

## 2021-09-26 DIAGNOSIS — Z961 Presence of intraocular lens: Secondary | ICD-10-CM | POA: Diagnosis not present

## 2021-09-26 DIAGNOSIS — Z981 Arthrodesis status: Secondary | ICD-10-CM | POA: Diagnosis not present

## 2021-09-27 DIAGNOSIS — Z23 Encounter for immunization: Secondary | ICD-10-CM | POA: Diagnosis not present

## 2021-09-28 ENCOUNTER — Encounter: Payer: Self-pay | Admitting: Occupational Therapy

## 2021-09-28 ENCOUNTER — Ambulatory Visit: Payer: Medicare Other | Admitting: Occupational Therapy

## 2021-09-28 ENCOUNTER — Ambulatory Visit: Payer: Medicare Other

## 2021-09-28 DIAGNOSIS — M6281 Muscle weakness (generalized): Secondary | ICD-10-CM

## 2021-09-28 DIAGNOSIS — R278 Other lack of coordination: Secondary | ICD-10-CM

## 2021-09-28 DIAGNOSIS — R262 Difficulty in walking, not elsewhere classified: Secondary | ICD-10-CM

## 2021-09-28 DIAGNOSIS — R2681 Unsteadiness on feet: Secondary | ICD-10-CM

## 2021-09-28 NOTE — Patient Instructions (Signed)
  Scapular: Stabilization    With palms resting comfortably on table, walk feet back, gently lean back to heels and then forward over hand keeping elbows straight while BRINGING HEAD AND CHEST UP, SHOULDERS DOWN. Hold 15 seconds. Relax. Repeat 10 times per set.  1X/DAY.    ROM: Extension - Wand (Standing)   Stand holding wand behind back. Raise arms as far as possible.  Right hand on handle of cane, thumb forward.   Left hand palm forward,  Keep head and chest up Repeat 10 times per set.  Do 1 sessions per day.

## 2021-09-28 NOTE — Therapy (Signed)
OUTPATIENT PHYSICAL THERAPY NEURO TREATMENT   Patient Name: Monica Hinton MRN: [id number] DOB:1945/10/30, 76 y.o., female Today's Date: 09/28/2021   PCP: Orpah Melter, MD REFERRING PROVIDER: Elodia Florence., MD     PT End of Session - 09/28/21 0755     Visit Number 15    Number of Visits 21    Date for PT Re-Evaluation 10/18/21    Authorization Type [employer] medicare    Progress Note Due on Visit 31    PT Start Time [phone]   patient from OT   PT Stop Time 0929    PT Time Calculation (min) 38 min    Activity Tolerance Patient tolerated treatment well    Behavior During Therapy WFL for tasks assessed/performed              Past Medical History:  Diagnosis Date   Allergic rhinitis    Asthma    seasonal    Bursitis    r hip    GERD (gastroesophageal reflux disease)    High cholesterol    Hypercholesteremia    Hypertension    Hypothyroidism    Hypothyroidism    Insomnia    Leukocytosis    Obesity    Peripheral vascular disease ([place])    bilateral LE edema ; edma in hands as well    PONV (postoperative nausea and vomiting)    Thrombocytosis    Vitamin D deficiency    Past Surgical History:  Procedure Laterality Date   bladder      bladder tack  20 years ago    COLONOSCOPY     EYE SURGERY Bilateral    catracts   hysterectomy      20 years    LUMBAR FUSION     OPEN SURGICAL REPAIR OF GLUTEAL TENDON Right 09/19/2016   Procedure: Right hip bursectomy and gluteal tendon repair;  Surgeon: Gaynelle Arabian, MD;  Location: [facility];  Service: Orthopedics;  Laterality: Right;   TONSILLECTOMY     age 20    TOTAL ANKLE ARTHROPLASTY Right 04/27/2021   Procedure: TOTAL ANKLE ARTHROPLASTY;  Surgeon: Wylene Simmer, MD;  Location: [place];  Service: Orthopedics;  Laterality: Right;   Patient Active Problem List   Diagnosis Date Noted   Syncope 08/17/2021   Leukocytosis 08/17/2021   History of CVA (cerebrovascular accident) 08/17/2021   Hormone  replacement therapy 07/25/2021   Essential hypertension 07/25/2021   Dyslipidemia 07/25/2021   Asthma, chronic 07/25/2021   Acute CVA (cerebrovascular accident) ([place]) 07/24/2021   Spondylolisthesis of lumbar region 09/10/2019   Tendinopathy of right gluteus medius 09/19/2016    ONSET DATE: 07/23/21  REFERRING DIAG: 63.9 (ICD-10-CM) - Acute CVA (cerebrovascular accident) ([place])   THERAPY DIAG:  Muscle weakness (generalized)  Other lack of coordination  Difficulty in walking, not elsewhere classified  Unsteadiness on feet  Rationale for Evaluation and Treatment Rehabilitation  SUBJECTIVE:  SUBJECTIVE STATEMENT: Patient reports doing well. Has difficulty with stagger stance sit <> stand at home. Denies falls/near falls.   Pt accompanied by: significant other [name]  PERTINENT HISTORY: HTN,HLD,hypothyroidism,asthma PVD chronic b/l lower extremity edema, recent ankle replacement ~04/2021  PAIN:  Are you having pain? No-would not consider her ankle soreness to be painful  PRECAUTIONS: Fall  WEIGHT BEARING RESTRICTIONS No   TODAY'S TREATMENT:  MANUAL: -effleurage and scar mobilization to R ankle  NMR: -staggered stance sit <> stand L LE on 4" step no UE 2x5 -staggered stance sit <> stand R LE on 4" step no UE  2x5 -staggered stance L LE in front sit <> stand 2x5  -ant lunge onto 6" step with U UE support and L LE x8     PATIENT EDUCATION: Education details: Continue HEP  Person educated: Patient and Spouse Education method: Explanation, Media planner, and Handouts Education comprehension: verbalized understanding   HOME EXERCISE PROGRAM: Access Code: [email or web address] URL: [email or web address] Date: 09/14/2021 Prepared by: Estevan Ryder  Exercises - Heel Raises with  Counter Support  - 1 x daily - 7 x weekly - 3 sets - 10 reps - Alternating Step Taps with Counter Support with 30 sec Hold with LLE on step  - 1 x daily - 7 x weekly - 3 sets - 10 reps - Seated Ankle Alphabet  - 1 x daily - 7 x weekly - 3 sets - 10 reps - Ankle Circles on Wobble Board in Sitting  - 1 x daily - 7 x weekly - 3 sets - 10 reps - Seated Calf Raise on Step  - 1 x daily - 7 x weekly - 3 sets - 10 reps - Forward Step Down with Heel Tap and Counter Support  - 1 x daily - 7 x weekly - 3 sets - 10 reps - Clamshell with Resistance  - 1 x daily - 7 x weekly - 3 sets - 10 reps - Standing Gastroc Stretch on Step with Counter Support  - 1 x daily - 7 x weekly - 1 sets - 3-4 reps - 30-45 seconds hold - Staggered Sit-to-Stand  - 1 x daily - 7 x weekly - 3 sets - 10 reps  GOALS: Goals reviewed with patient? Yes  SHORT TERM GOALS: Target date: 09/13/2021  Pt will be independent with initial HEP for improved balance and gait mechanics  Baseline: to be provided Goal status: MET  2.  Pt will improve FGA to >/= 12/30 to demonstrate improved balance and reduced fall risk  Baseline: 7/30, 14/30 (8/29) Goal status: MET  3.  Pt will improve gait speed to >/= 1.12m/s to demonstrate improved community ambulation  Baseline: .62m/s with rollator, .36m/s with rollator  Goal status: IN PROGRESS   LONG TERM GOALS: Target date: 10/18/2021  Pt will be independent with final HEP for improved balance and gait mechanics   Baseline: to be provided Goal status: INITIAL  2.  Pt will improve FGA to >/= 17/30 to demonstrate improved balance and reduced fall risk  Baseline: 7/30, 14/30 (8/29) Goal status: INITIAL  3.  Pt will improve gait speed to >/= 1.35m/s to demonstrate improved community ambulation  Baseline: .7m/s with rollator, .51m/s with rollator  Goal status: REVISED  ASSESSMENT:  CLINICAL IMPRESSION: Patient seen for skilled PT session with emphasis on functional LE strengthening and R  ankle AROM. Patient demonstrating significant improvement in R LE with relatively equal strength noted. Improving scar mobilization noted and decrease in swelling. Discussed with  patient to do staggered stance sit <> stands from HEP with either L LE in front of R LE OR 1 leg on elevated step. Both verbalized understanding. Continue POC.     OBJECTIVE IMPAIRMENTS Abnormal gait, decreased activity tolerance, decreased balance, decreased endurance, decreased knowledge of condition, decreased knowledge of use of DME, difficulty walking, and decreased strength.   ACTIVITY LIMITATIONS carrying, lifting, bending, standing, squatting, stairs, and locomotion level  PARTICIPATION LIMITATIONS: meal prep, cleaning, laundry, driving, shopping, and community activity  PERSONAL FACTORS Age, Fitness, Past/current experiences, and 3+ comorbidities: HTN HLD, PVD, LE edema, recent R ankle replacement   are also affecting patient's functional outcome.   REHAB POTENTIAL: Good  CLINICAL DECISION MAKING: Stable/uncomplicated  EVALUATION COMPLEXITY: Low  PLAN: PT FREQUENCY: 2x/week  PT DURATION: 10 weeks  PLANNED INTERVENTIONS: Therapeutic exercises, Therapeutic activity, Neuromuscular re-education, Balance training, Gait training, Patient/Family education, Self Care, Joint mobilization, Joint manipulation, Stair training, Vestibular training, Visual/preceptual remediation/compensation, Orthotic/Fit training, DME instructions, Aquatic Therapy, Dry Needling, Cognitive remediation, Electrical stimulation, Spinal manipulation, Spinal mobilization, Cryotherapy, Moist heat, Manual lymph drainage, scar mobilization, Taping, Manual therapy, and Re-evaluation  PLAN FOR NEXT SESSION: continue to work on balance, gait mechanics, R ankle strengthening, eccentric step-downs, increasing stance time on RLE, R hip strengthening, continue stairs w/ quad rubber tip SPC no rail-descent, airex stair taps on RLE, 8" hurdles, SLS on  airex RLE, may need to practice cane on gravel-pt has packed gravel driveway and is nervous about this, static balance-EC (add to HEP?), discuss setup of pt getting into her high bed/practice w/ step, add SLS on firm ground with emphasis on abd control   Debbora Dus, PT, DPT Debbora Dus, PT, DPT, CBIS 09/28/2021, 9:29 AM

## 2021-09-28 NOTE — Therapy (Signed)
OUTPATIENT OCCUPATIONAL THERAPY NEURO TREATMENT  Patient Name: Monica Hinton MRN: [id number] DOB:August 23, 1945, 76 y.o., female Today's Date: 09/28/2021  PCP: Orpah Melter, MD  REFERRING PROVIDER: PCP: Orpah Melter, MD    OT End of Session - 09/28/21 1228     Visit Number 6    Number of Visits 8    Date for OT Re-Evaluation 10/12/21    Authorization Type UHC MCR    OT Start Time 0805    OT Stop Time 0845    OT Time Calculation (min) 40 min    Activity Tolerance Patient tolerated treatment well    Behavior During Therapy WFL for tasks assessed/performed               Past Medical History:  Diagnosis Date   Allergic rhinitis    Asthma    seasonal    Bursitis    r hip    GERD (gastroesophageal reflux disease)    High cholesterol    Hypercholesteremia    Hypertension    Hypothyroidism    Hypothyroidism    Insomnia    Leukocytosis    Obesity    Peripheral vascular disease ([place])    bilateral LE edema ; edma in hands as well    PONV (postoperative nausea and vomiting)    Thrombocytosis    Vitamin D deficiency    Past Surgical History:  Procedure Laterality Date   bladder      bladder tack  20 years ago    COLONOSCOPY     EYE SURGERY Bilateral    catracts   hysterectomy      20 years    LUMBAR FUSION     OPEN SURGICAL REPAIR OF GLUTEAL TENDON Right 09/19/2016   Procedure: Right hip bursectomy and gluteal tendon repair;  Surgeon: Gaynelle Arabian, MD;  Location: [facility];  Service: Orthopedics;  Laterality: Right;   TONSILLECTOMY     age 65    TOTAL ANKLE ARTHROPLASTY Right 04/27/2021   Procedure: TOTAL ANKLE ARTHROPLASTY;  Surgeon: Wylene Simmer, MD;  Location: [facility];  Service: Orthopedics;  Laterality: Right;   Patient Active Problem List   Diagnosis Date Noted   Syncope 08/17/2021   Leukocytosis 08/17/2021   History of CVA (cerebrovascular accident) 08/17/2021   Hormone replacement therapy 07/25/2021   Essential hypertension  07/25/2021   Dyslipidemia 07/25/2021   Asthma, chronic 07/25/2021   Acute CVA (cerebrovascular accident) ([place]) 07/24/2021   Spondylolisthesis of lumbar region 09/10/2019   Tendinopathy of right gluteus medius 09/19/2016    ONSET DATE: 07/23/2021   REFERRING DIAG: I63.9 (ICD-10-CM) - Acute CVA (cerebrovascular accident) ([place])   MRI with 12 mm acute ischemic nonhemorrhagic posterior L basal ganglia infarct   THERAPY DIAG:  Muscle weakness (generalized)  Other lack of coordination  Unsteadiness on feet  Rationale for Evaluation and Treatment Rehabilitation  SUBJECTIVE:   SUBJECTIVE STATEMENT: Little tired from PT.  Pt reports that R shoulder is more due to arthritis and that she is concerned that it is aggravated by exercises with incr discomfort.   Pt accompanied by: significant other  PERTINENT HISTORY: medical history significant of  HTN,HLD,hypothyroidism,asthma PVD chronic b/l lower extremity edema, L4-5 fusion 2021, Rt ankle replacement 04/27/21  PRECAUTIONS: Fall  WEIGHT BEARING RESTRICTIONS No  PAIN:  Are you having pain? No but has discomfort  PLOF: Independent, retired, has not driven past year due to ankle, ballroom dancing  PATIENT GOALS get back to 100%  OBJECTIVE:   HAND DOMINANCE: Right  TODAY'S TREATMENT  Reviewed HEP and provided min-mod cueing for positioning and proper technique as pt presents with shoulder in IR and with decr scapular stability.  Pt cued to move with "thumb up," elbow straight, and scapular retraction and depression with all exercises.  Reviewed to only perform resistive (band/weighted exercises every other day), but to do ROM exercises/non-resistive exercises every day.  Instructed pt to avoid doing overhead/shoulder flex with weight (she reports doing this at home), but when attempted in clinic, pt with significant compensation patterns.  In supine, gentle joint mobs/stretch to shoulder.     PATIENT EDUCATION: Education  details:  Added scapular retraction/shoulder ext with cane, and  forward/backward wt. Shifts with hands on table for scapular stability.  Reviewed current HEP with mod cueing for proper positioning/technique.  Person educated: Patient and Spouse Education method: Explanation, Demonstration, Tactile cues, Verbal cues, and Handouts Education comprehension: verbalized understanding, returned demonstration, and verbal cues required    [place]: 08/15/21: coordination and putty HEP for Rt hand 08/22/21: HEP for scapula/sh girdle 08/24/21: Theraband HEP   GOALS: Goals reviewed with patient? Yes  LONG TERM GOALS: Target date: 09/09/21  Independent w/ HEP for Rt shoulder ROM/strengthening, grip strengthening, and high level coordination HEP  Baseline:  Goal status: MET  2.  Pt to simulate cooking task and laundry tasks safely using rollator Baseline:  Goal status: MET  3.  Pt to demo Rt shoulder flexion sufficient for reaching into higher cabinets for light objects Goal status: REVISED  4.  Improve Rt hand coordination as evidenced by performing 9 hole peg test in 24 sec or less Baseline: 28.72 sec Goal status: IN PROGRESS  ASSESSMENT:  CLINICAL IMPRESSION: Pt reports discomfort in R shoulder at times and is concerned about exercises aggravating arthritis.  Discussed  reducing frequency of resistive exercises and focus on positioning.    PERFORMANCE DEFICITS in functional skills including IADLs, coordination, dexterity, ROM, strength, mobility, balance, body mechanics, decreased knowledge of use of DME, and UE functional use,.   IMPAIRMENTS are limiting patient from IADLs, leisure, and social participation.   COMORBIDITIES may have co-morbidities  that affects occupational performance. Patient will benefit from skilled OT to address above impairments and improve overall function.  MODIFICATION OR ASSISTANCE TO COMPLETE EVALUATION: No modification of tasks or assist necessary  to complete an evaluation.  OT OCCUPATIONAL PROFILE AND HISTORY: Problem focused assessment: Including review of records relating to presenting problem.  CLINICAL DECISION MAKING: Moderate - several treatment options, min-mod task modification necessary  REHAB POTENTIAL: Good  EVALUATION COMPLEXITY: Low  PLAN: OT FREQUENCY: 2x/week  OT DURATION: 4 weeks (ADDITIONAL) prn beginning 09/11/21 - 10/12/21  PLANNED INTERVENTIONS: self care/ADL training, therapeutic exercise, therapeutic activity, neuromuscular re-education, functional mobility training, patient/family education, coping strategies training, and DME and/or AE instructions  RECOMMENDED OTHER SERVICES: none  CONSULTED AND AGREED WITH PLAN OF CARE: Patient and family member/caregiver  PLAN FOR NEXT SESSION: review updates to HEP and positioning/techniques, check remaining goals and anticipate d/c.    Sheilyn Boehlke, OTR/L 09/28/2021, 12:29 PM

## 2021-10-02 NOTE — Therapy (Signed)
OUTPATIENT OCCUPATIONAL THERAPY NEURO TREATMENT  Patient Name: Monica Hinton MRN: [id number] DOB:1945-12-18, 76 y.o., female Today's Date: 10/03/2021  PCP: Orpah Melter, MD  REFERRING PROVIDER: PCP: Orpah Melter, MD    OT End of Session - 10/03/21 0936     Visit Number 7    Number of Visits 8    Date for OT Re-Evaluation 10/12/21    Authorization Type UHC MCR    OT Start Time 0935    OT Stop Time 1015    OT Time Calculation (min) 40 min    Activity Tolerance Patient tolerated treatment well    Behavior During Therapy WFL for tasks assessed/performed                Past Medical History:  Diagnosis Date   Allergic rhinitis    Asthma    seasonal    Bursitis    r hip    GERD (gastroesophageal reflux disease)    High cholesterol    Hypercholesteremia    Hypertension    Hypothyroidism    Hypothyroidism    Insomnia    Leukocytosis    Obesity    Peripheral vascular disease ([place])    bilateral LE edema ; edma in hands as well    PONV (postoperative nausea and vomiting)    Thrombocytosis    Vitamin D deficiency    Past Surgical History:  Procedure Laterality Date   bladder      bladder tack  20 years ago    COLONOSCOPY     EYE SURGERY Bilateral    catracts   hysterectomy      20 years    LUMBAR FUSION     OPEN SURGICAL REPAIR OF GLUTEAL TENDON Right 09/19/2016   Procedure: Right hip bursectomy and gluteal tendon repair;  Surgeon: Gaynelle Arabian, MD;  Location: [facility];  Service: Orthopedics;  Laterality: Right;   TONSILLECTOMY     age 43    TOTAL ANKLE ARTHROPLASTY Right 04/27/2021   Procedure: TOTAL ANKLE ARTHROPLASTY;  Surgeon: Wylene Simmer, MD;  Location: [facility];  Service: Orthopedics;  Laterality: Right;   Patient Active Problem List   Diagnosis Date Noted   Syncope 08/17/2021   Leukocytosis 08/17/2021   History of CVA (cerebrovascular accident) 08/17/2021   Hormone replacement therapy 07/25/2021   Essential hypertension  07/25/2021   Dyslipidemia 07/25/2021   Asthma, chronic 07/25/2021   Acute CVA (cerebrovascular accident) ([place]) 07/24/2021   Spondylolisthesis of lumbar region 09/10/2019   Tendinopathy of right gluteus medius 09/19/2016    ONSET DATE: 07/23/2021   REFERRING DIAG: I63.9 (ICD-10-CM) - Acute CVA (cerebrovascular accident) ([place])   MRI with 12 mm acute ischemic nonhemorrhagic posterior L basal ganglia infarct   THERAPY DIAG:  Muscle weakness (generalized)  Other lack of coordination  Rationale for Evaluation and Treatment Rehabilitation  SUBJECTIVE:   SUBJECTIVE STATEMENT: Pt reports that she has been doing exercises, but has had some discomfort, not sure if she is doing the exercises properly   Pt accompanied by: significant other  PERTINENT HISTORY: medical history significant of  HTN,HLD,hypothyroidism,asthma PVD chronic b/l lower extremity edema, L4-5 fusion 2021, Rt ankle replacement 04/27/21  PRECAUTIONS: Fall  WEIGHT BEARING RESTRICTIONS No  PAIN:  Are you having pain? No but has discomfort  PLOF: Independent, retired, has not driven past year due to ankle, ballroom dancing  PATIENT GOALS get back to 100%  OBJECTIVE:   HAND DOMINANCE: Right  TODAY'S TREATMENT  Reviewed HEP (consolidated, updated, and re-issued  handouts with extensive notes/directions handwritten on them), provided min-mod cueing for positioning and proper technique as pt presents with shoulder in IR and with decr scapular stability.  (Husband also took picture of proper R hand positioning on cane for all cane exercises.)  Pt cued to move with "thumb up," elbow straight, and scapular retraction and depression with all exercises.    Checked remaining goals and discussed progress.   PATIENT EDUCATION: Education details:  Reviewed, updated, and consolidated HEP--reissued and wrote extensive notes for improved positioning/technique   Person educated: Patient and Spouse Education method: Explanation,  Demonstration, Tactile cues, Verbal cues, and Handouts Education comprehension: verbalized understanding, returned demonstration, and verbal cues required    HOME EXERCISE PROGRAM: 08/15/21: coordination and putty HEP for Rt hand 08/22/21: HEP for scapula/sh girdle 08/24/21: Theraband HEP   GOALS: Goals reviewed with patient? Yes  LONG TERM GOALS: Target date: 09/09/21  Independent w/ HEP for Rt shoulder ROM/strengthening, grip strengthening, and high level coordination HEP  Baseline:  Goal status: MET  2.  Pt to simulate cooking task and laundry tasks safely using rollator Baseline:  Goal status: MET  3.  Pt to demo Rt shoulder flexion sufficient for reaching into higher cabinets for light objects Goal status: Met  4.  Improve Rt hand coordination as evidenced by performing 9 hole peg test in 24 sec or less Baseline: 28.72 sec Goal status: Met.  21.03sec  ASSESSMENT:  CLINICAL IMPRESSION: Pt pleased with progress.  Pt reports no shoulder discomfort with proper positioning.  All goals met and pt appropriate for and agrees with d/c at this time.  PERFORMANCE DEFICITS in functional skills including IADLs, coordination, dexterity, ROM, strength, mobility, balance, body mechanics, decreased knowledge of use of DME, and UE functional use,.   IMPAIRMENTS are limiting patient from IADLs, leisure, and social participation.   COMORBIDITIES may have co-morbidities  that affects occupational performance. Patient will benefit from skilled OT to address above impairments and improve overall function.  MODIFICATION OR ASSISTANCE TO COMPLETE EVALUATION: No modification of tasks or assist necessary to complete an evaluation.  OT OCCUPATIONAL PROFILE AND HISTORY: Problem focused assessment: Including review of records relating to presenting problem.  CLINICAL DECISION MAKING: Moderate - several treatment options, min-mod task modification necessary  REHAB POTENTIAL: Good  EVALUATION  COMPLEXITY: Low  PLAN: OT FREQUENCY: 2x/week  OT DURATION: 4 weeks (ADDITIONAL) prn beginning 09/11/21 - 10/12/21  PLANNED INTERVENTIONS: self care/ADL training, therapeutic exercise, therapeutic activity, neuromuscular re-education, functional mobility training, patient/family education, coping strategies training, and DME and/or AE instructions  RECOMMENDED OTHER SERVICES: none  CONSULTED AND AGREED WITH PLAN OF CARE: Patient and family member/caregiver  PLAN FOR NEXT SESSION: d/c OT  OCCUPATIONAL THERAPY DISCHARGE SUMMARY  Visits from Start of Care: 7  Current functional level related to goals / functional outcomes: See above   Remaining deficits: Decr strength, mild intermittent shoulder discomfort with improper positioning (improves with proper positioning)  Coordination WNL   Education / Equipment: Pt/husband instructed in HEP, proper positioning of RUE.  Pt/husband verbalize understanding of education provided.   Patient agrees to discharge. Patient goals were met. Patient is being discharged due to being pleased with the current functional level. And meeting rehab goals.       Rachna Schonberger, OTR/L 10/03/2021, 9:37 AM

## 2021-10-03 ENCOUNTER — Ambulatory Visit: Payer: Medicare Other | Admitting: Occupational Therapy

## 2021-10-03 ENCOUNTER — Ambulatory Visit: Payer: Medicare Other

## 2021-10-03 ENCOUNTER — Encounter: Payer: Medicare Other | Admitting: Occupational Therapy

## 2021-10-03 ENCOUNTER — Ambulatory Visit: Payer: Medicare Other | Admitting: Physical Therapy

## 2021-10-03 ENCOUNTER — Encounter: Payer: Self-pay | Admitting: Occupational Therapy

## 2021-10-03 DIAGNOSIS — R262 Difficulty in walking, not elsewhere classified: Secondary | ICD-10-CM

## 2021-10-03 DIAGNOSIS — M6281 Muscle weakness (generalized): Secondary | ICD-10-CM

## 2021-10-03 DIAGNOSIS — R2681 Unsteadiness on feet: Secondary | ICD-10-CM | POA: Diagnosis not present

## 2021-10-03 DIAGNOSIS — R278 Other lack of coordination: Secondary | ICD-10-CM

## 2021-10-03 NOTE — Therapy (Signed)
OUTPATIENT PHYSICAL THERAPY NEURO TREATMENT   Patient Name: Monica Hinton MRN: [id number] DOB:11/14/1945, 76 y.o., female Today's Date: 10/03/2021   PCP: Orpah Melter, MD REFERRING PROVIDER: Elodia Florence., MD     PT End of Session - 10/03/21 0846     Visit Number 16    Number of Visits 21    Date for PT Re-Evaluation 10/18/21    Authorization Type UHC medicare    Progress Note Due on Visit 3    PT Start Time 0845    PT Stop Time 0930    PT Time Calculation (min) 45 min    Equipment Utilized During Treatment Gait belt    Activity Tolerance Patient tolerated treatment well    Behavior During Therapy WFL for tasks assessed/performed               Past Medical History:  Diagnosis Date   Allergic rhinitis    Asthma    seasonal    Bursitis    r hip    GERD (gastroesophageal reflux disease)    High cholesterol    Hypercholesteremia    Hypertension    Hypothyroidism    Hypothyroidism    Insomnia    Leukocytosis    Obesity    Peripheral vascular disease ([place])    bilateral LE edema ; edma in hands as well    PONV (postoperative nausea and vomiting)    Thrombocytosis    Vitamin D deficiency    Past Surgical History:  Procedure Laterality Date   bladder      bladder tack  20 years ago    COLONOSCOPY     EYE SURGERY Bilateral    catracts   hysterectomy      20 years    LUMBAR FUSION     OPEN SURGICAL REPAIR OF GLUTEAL TENDON Right 09/19/2016   Procedure: Right hip bursectomy and gluteal tendon repair;  Surgeon: Gaynelle Arabian, MD;  Location: [facility];  Service: Orthopedics;  Laterality: Right;   TONSILLECTOMY     age 38    TOTAL ANKLE ARTHROPLASTY Right 04/27/2021   Procedure: TOTAL ANKLE ARTHROPLASTY;  Surgeon: Wylene Simmer, MD;  Location: [facility];  Service: Orthopedics;  Laterality: Right;   Patient Active Problem List   Diagnosis Date Noted   Syncope 08/17/2021   Leukocytosis 08/17/2021   History of CVA (cerebrovascular  accident) 08/17/2021   Hormone replacement therapy 07/25/2021   Essential hypertension 07/25/2021   Dyslipidemia 07/25/2021   Asthma, chronic 07/25/2021   Acute CVA (cerebrovascular accident) ([place]) 07/24/2021   Spondylolisthesis of lumbar region 09/10/2019   Tendinopathy of right gluteus medius 09/19/2016    ONSET DATE: 07/23/21  REFERRING DIAG: 63.9 (ICD-10-CM) - Acute CVA (cerebrovascular accident) ([place])   THERAPY DIAG:  Muscle weakness (generalized)  Other lack of coordination  Unsteadiness on feet  Difficulty in walking, not elsewhere classified  Rationale for Evaluation and Treatment Rehabilitation  SUBJECTIVE:  SUBJECTIVE STATEMENT: Pt reports no changes since last session, no pain, no falls or near falls. Doing HEP with ongoing questions regarding the staggered sit to stand.  Pt accompanied by: significant other [name]  PERTINENT HISTORY: HTN,HLD,hypothyroidism,asthma PVD chronic b/l lower extremity edema, recent ankle replacement ~04/2021  PAIN:  Are you having pain? No-would not consider her ankle soreness to be painful  PRECAUTIONS: Fall  WEIGHT BEARING RESTRICTIONS No   TODAY'S TREATMENT:  NMR: Standing lunge with LLE on 12" step to stretch R ankle, 5 x 30 sec each, added to HEP  Staggered sit to stand with LLE forwards x 10 reps Staggered sit to stand with 4" step under RLE, under LLE x 10 reps each, focus on eccentric control when sitting  SLS on RLE 5 x 30 sec, on LLE 5 x 30 sec with BUE support on back of chair with goal to work towards decreased UE support, added to HEP  In // bars: -step-ups onto Bosu ball, x 10 reps with LLE, x 5 reps with RLE with BUE support, 10-15 sec hold    PATIENT EDUCATION: Education details: Continue HEP, added to HEP and clarified  staggered sit to stands  Person educated: Patient and Spouse Education method: Explanation, Media planner, and Handouts Education comprehension: verbalized understanding   HOME EXERCISE PROGRAM: Access Code: [email or web address] URL: [email or web address] Date: 09/14/2021 Prepared by: Estevan Ryder  Exercises - Heel Raises with Counter Support  - 1 x daily - 7 x weekly - 3 sets - 10 reps - Alternating Step Taps with Counter Support with 30 sec Hold with LLE on step  - 1 x daily - 7 x weekly - 3 sets - 10 reps - Seated Ankle Alphabet  - 1 x daily - 7 x weekly - 3 sets - 10 reps - Ankle Circles on Wobble Board in Sitting  - 1 x daily - 7 x weekly - 3 sets - 10 reps - Seated Calf Raise on Step  - 1 x daily - 7 x weekly - 3 sets - 10 reps - Forward Step Down with Heel Tap and Counter Support  - 1 x daily - 7 x weekly - 3 sets - 10 reps - Clamshell with Resistance  - 1 x daily - 7 x weekly - 3 sets - 10 reps - Standing Gastroc Stretch on Step with Counter Support  - 1 x daily - 7 x weekly - 1 sets - 3-4 reps - 30-45 seconds hold - Staggered Sit-to-Stand  - 1 x daily - 7 x weekly - 3 sets - 10 reps - Standing Single Leg Stance with Counter Support  - 1 x daily - 7 x weekly - 1 sets - 5 reps - 30 sec hold  Standing Lunge at [place] of Stairs Stand at the bottom of the stairs holding onto your handrail. Lift your left foot up to the 2nd step and lean forwards until you feel a stretch in your R ankle. You can do this stretch about 5 times, holding for about 30 sec each.  GOALS: Goals reviewed with patient? Yes  SHORT TERM GOALS: Target date: 09/13/2021  Pt will be independent with initial HEP for improved balance and gait mechanics  Baseline: to be provided Goal status: MET  2.  Pt will improve FGA to >/= 12/30 to demonstrate improved balance and reduced fall risk  Baseline: 7/30, 14/30 (8/29) Goal status: MET  3.  Pt will improve gait speed to >/= 1.48m/s to demonstrate improved  community  ambulation  Baseline: .37m/s with rollator, .68m/s with rollator  Goal status: IN PROGRESS   LONG TERM GOALS: Target date: 10/18/2021  Pt will be independent with final HEP for improved balance and gait mechanics   Baseline: to be provided Goal status: INITIAL  2.  Pt will improve FGA to >/= 17/30 to demonstrate improved balance and reduced fall risk  Baseline: 7/30, 14/30 (8/29) Goal status: INITIAL  3.  Pt will improve gait speed to >/= 1.32m/s to demonstrate improved community ambulation  Baseline: .47m/s with rollator, .32m/s with rollator  Goal status: REVISED  ASSESSMENT:  CLINICAL IMPRESSION: Emphasis of skilled PT session on working on ankle strengthening, stretching and stability. Added standing lunge and SLS to HEP, handout provided. Also clarified how to perform staggered sit to stands with patient. Pt has onset of BLE fatigue following session this date but reports exercises remain a good challenge. Pt continues to benefit from skilled therapy services to address ongoing LE weakness as well as impaired gait and balance. Continue POC.   OBJECTIVE IMPAIRMENTS Abnormal gait, decreased activity tolerance, decreased balance, decreased endurance, decreased knowledge of condition, decreased knowledge of use of DME, difficulty walking, and decreased strength.   ACTIVITY LIMITATIONS carrying, lifting, bending, standing, squatting, stairs, and locomotion level  PARTICIPATION LIMITATIONS: meal prep, cleaning, laundry, driving, shopping, and community activity  PERSONAL FACTORS Age, Fitness, Past/current experiences, and 3+ comorbidities: HTN HLD, PVD, LE edema, recent R ankle replacement   are also affecting patient's functional outcome.   REHAB POTENTIAL: Good  CLINICAL DECISION MAKING: Stable/uncomplicated  EVALUATION COMPLEXITY: Low  PLAN: PT FREQUENCY: 2x/week  PT DURATION: 10 weeks  PLANNED INTERVENTIONS: Therapeutic exercises, Therapeutic activity,  Neuromuscular re-education, Balance training, Gait training, Patient/Family education, Self Care, Joint mobilization, Joint manipulation, Stair training, Vestibular training, Visual/preceptual remediation/compensation, Orthotic/Fit training, DME instructions, Aquatic Therapy, Dry Needling, Cognitive remediation, Electrical stimulation, Spinal manipulation, Spinal mobilization, Cryotherapy, Moist heat, Manual lymph drainage, scar mobilization, Taping, Manual therapy, and Re-evaluation  PLAN FOR NEXT SESSION: continue to work on balance, gait mechanics, R ankle strengthening, eccentric step-downs, increasing stance time on RLE, R hip strengthening, continue stairs w/ quad rubber tip SPC no rail-descent, airex stair taps on RLE, 8" hurdles, SLS on airex RLE, may need to practice cane on gravel-pt has packed gravel driveway and is nervous about this, static balance-EC (add to HEP?)   Excell Seltzer, PT, DPT, CSRS  10/03/2021, 9:34 AM

## 2021-10-03 NOTE — Patient Instructions (Addendum)
     Lie on back holding wand. Raise arms over head. Hold 5sec. Repeat 10 times per set.  Do 2-3 sessions per day.    Press-Up With Wand   Press wand up until elbows are straight, then reach wand over head to a pain free range. Hold 5 seconds. Repeat 10 times. Do 2-3 sessions per day.    Cane Overhead - Standing   With arms straight, hold cane forward at waist. Raise cane above head. Hold 3 seconds. Repeat 10 times. Do 2 times per day.       ROM: Extension - Wand (Standing)   Stand holding wand behind back. Raise arms as far as possible.  Right hand on handle of cane, thumb forward.   Left hand palm forward,  Keep head and chest up Repeat 10 times per set.  Do 1 sessions per day.     Scapular: Stabilization    With palms resting comfortably on table, walk feet back, gently lean back to heels and then forward over hand keeping elbows straight while BRINGING HEAD AND CHEST UP, SHOULDERS DOWN. Hold 15 seconds. Relax. Repeat 10 times per set.  1X/DAY.  Scapular Retraction: Bilateral    Facing anchor, holding yellow theraband at ends, pull arms back, bringing shoulder blades together. Repeat _10___ times per set.  Do __2__ sessions per day, every other day.       Strengthening: Resisted Extension   Hold yellow theraband in __RT___ hand(s), arm forward. Pull arm back, elbow straight. Repeat _10___ times per set. Do _1-2___ sessions per day, every other day.

## 2021-10-05 ENCOUNTER — Ambulatory Visit: Payer: Medicare Other

## 2021-10-05 DIAGNOSIS — M6281 Muscle weakness (generalized): Secondary | ICD-10-CM | POA: Diagnosis not present

## 2021-10-05 DIAGNOSIS — R262 Difficulty in walking, not elsewhere classified: Secondary | ICD-10-CM | POA: Diagnosis not present

## 2021-10-05 DIAGNOSIS — R278 Other lack of coordination: Secondary | ICD-10-CM

## 2021-10-05 DIAGNOSIS — R2681 Unsteadiness on feet: Secondary | ICD-10-CM

## 2021-10-05 NOTE — Therapy (Signed)
OUTPATIENT PHYSICAL THERAPY NEURO TREATMENT   Patient Name: Monica Hinton MRN: [id number] DOB:[birth year], 76 y.o., female Today's Date: 10/05/2021   PCP: Orpah Melter, MD REFERRING PROVIDER: Elodia Florence., MD     PT End of Session - 10/05/21 0933     Visit Number 17    Number of Visits 21    Date for PT Re-Evaluation 10/18/21    Authorization Type [employer] medicare    Progress Note Due on Visit 84    PT Start Time 0931    PT Stop Time 1015    PT Time Calculation (min) 44 min    Activity Tolerance Patient tolerated treatment well    Behavior During Therapy WFL for tasks assessed/performed               Past Medical History:  Diagnosis Date   Allergic rhinitis    Asthma    seasonal    Bursitis    r hip    GERD (gastroesophageal reflux disease)    High cholesterol    Hypercholesteremia    Hypertension    Hypothyroidism    Hypothyroidism    Insomnia    Leukocytosis    Obesity    Peripheral vascular disease ([place])    bilateral LE edema ; edma in hands as well    PONV (postoperative nausea and vomiting)    Thrombocytosis    Vitamin D deficiency    Past Surgical History:  Procedure Laterality Date   bladder      bladder tack  20 years ago    COLONOSCOPY     EYE SURGERY Bilateral    catracts   hysterectomy      20 years    LUMBAR FUSION     OPEN SURGICAL REPAIR OF GLUTEAL TENDON Right 09/19/2016   Procedure: Right hip bursectomy and gluteal tendon repair;  Surgeon: Gaynelle Arabian, MD;  Location: [facility];  Service: Orthopedics;  Laterality: Right;   TONSILLECTOMY     age 36    TOTAL ANKLE ARTHROPLASTY Right 04/27/2021   Procedure: TOTAL ANKLE ARTHROPLASTY;  Surgeon: Wylene Simmer, MD;  Location: [place];  Service: Orthopedics;  Laterality: Right;   Patient Active Problem List   Diagnosis Date Noted   Syncope 08/17/2021   Leukocytosis 08/17/2021   History of CVA (cerebrovascular accident) 08/17/2021   Hormone replacement  therapy 07/25/2021   Essential hypertension 07/25/2021   Dyslipidemia 07/25/2021   Asthma, chronic 07/25/2021   Acute CVA (cerebrovascular accident) ([place]) 07/24/2021   Spondylolisthesis of lumbar region 09/10/2019   Tendinopathy of right gluteus medius 09/19/2016    ONSET DATE: 07/23/21  REFERRING DIAG: 63.9 (ICD-10-CM) - Acute CVA (cerebrovascular accident) ([place])   THERAPY DIAG:  Muscle weakness (generalized)  Other lack of coordination  Unsteadiness on feet  Difficulty in walking, not elsewhere classified  Rationale for Evaluation and Treatment Rehabilitation  SUBJECTIVE:  SUBJECTIVE STATEMENT: Patient reports doing well. Denies falls/near falls. Taking the steps reciprocally. Attempted driving yesterday without major issue, but short distance.   Pt accompanied by: significant other [name]  PERTINENT HISTORY: HTN,HLD,hypothyroidism,asthma PVD chronic b/l lower extremity edema, recent ankle replacement ~04/2021  PAIN:  Are you having pain? No-would not consider her ankle soreness to be painful  PRECAUTIONS: Fall  WEIGHT BEARING RESTRICTIONS No   TODAY'S TREATMENT:  NMR: -scifit hills level 3 x10 mins -forward rock and reach with R foot trailing with emphasis on toe- off and anterior full weight shift -SLS at counter with R UE support, emphasis on R hip engagement    PATIENT EDUCATION: Education details: Continue HEP Person educated: Patient and Spouse Education method: Explanation, Demonstration, and Handouts Education comprehension: verbalized understanding   HOME EXERCISE PROGRAM: Access Code: [email or web address] URL: [email or web address] Date: 10/05/2021 Prepared by: Estevan Ryder  Exercises - Heel Raises with Counter Support  - 1 x daily - 7 x weekly - 3 sets - 10  reps - Clamshell with Resistance  - 1 x daily - 7 x weekly - 3 sets - 10 reps - Staggered Sit-to-Stand  - 1 x daily - 7 x weekly - 3 sets - 10 reps - Standing Single Leg Stance with Counter Support  - 1 x daily - 7 x weekly - 1 sets - 5 reps - 30 sec hold -forward rock and reach   Standing Lunge at [place] of Stairs Stand at the bottom of the stairs holding onto your handrail. Lift your left foot up to the 2nd step and lean forwards until you feel a stretch in your R ankle. You can do this stretch about 5 times, holding for about 30 sec each.  GOALS: Goals reviewed with patient? Yes  SHORT TERM GOALS: Target date: 09/13/2021  Pt will be independent with initial HEP for improved balance and gait mechanics  Baseline: to be provided Goal status: MET  2.  Pt will improve FGA to >/= 12/30 to demonstrate improved balance and reduced fall risk  Baseline: 7/30, 14/30 (8/29) Goal status: MET  3.  Pt will improve gait speed to >/= 1.41ms to demonstrate improved community ambulation  Baseline: .283m with rollator, .8430mwith rollator  Goal status: IN PROGRESS   LONG TERM GOALS: Target date: 10/18/2021  Pt will be independent with final HEP for improved balance and gait mechanics   Baseline: to be provided Goal status: INITIAL  2.  Pt will improve FGA to >/= 17/30 to demonstrate improved balance and reduced fall risk  Baseline: 7/30, 14/30 (8/29) Goal status: INITIAL  3.  Pt will improve gait speed to >/= 1.2m/17mo demonstrate improved community ambulation  Baseline: .93m/62mth rollator, .41m/s40mh rollator  Goal status: REVISED  ASSESSMENT:  CLINICAL IMPRESSION: Patient seen for skilled PT session with emphasis on R LE NMR and pre-gait exercises. Patient remaining with shortened L step length due to poor R hip engagement. Tolerated rock and reach well with improved toe-off. Continues to have difficulty with R hip engagement in SLS with resultant trendelenburg. Continue POC.     OBJECTIVE IMPAIRMENTS Abnormal gait, decreased activity tolerance, decreased balance, decreased endurance, decreased knowledge of condition, decreased knowledge of use of DME, difficulty walking, and decreased strength.   ACTIVITY LIMITATIONS carrying, lifting, bending, standing, squatting, stairs, and locomotion level  PARTICIPATION LIMITATIONS: meal prep, cleaning, laundry, driving, shopping, and community activity  PERSONAL FACTORS Age, Fitness, Past/current experiences, and 3+ comorbidities: HTN HLD, PVD, LE edema, recent R  ankle replacement   are also affecting patient's functional outcome.   REHAB POTENTIAL: Good  CLINICAL DECISION MAKING: Stable/uncomplicated  EVALUATION COMPLEXITY: Low  PLAN: PT FREQUENCY: 2x/week  PT DURATION: 10 weeks  PLANNED INTERVENTIONS: Therapeutic exercises, Therapeutic activity, Neuromuscular re-education, Balance training, Gait training, Patient/Family education, Self Care, Joint mobilization, Joint manipulation, Stair training, Vestibular training, Visual/preceptual remediation/compensation, Orthotic/Fit training, DME instructions, Aquatic Therapy, Dry Needling, Cognitive remediation, Electrical stimulation, Spinal manipulation, Spinal mobilization, Cryotherapy, Moist heat, Manual lymph drainage, scar mobilization, Taping, Manual therapy, and Re-evaluation  PLAN FOR NEXT SESSION: continue to work on balance, gait mechanics, R ankle strengthening, eccentric step-downs, increasing stance time on RLE, R hip strengthening, continue stairs w/ quad rubber tip SPC no rail-descent, airex stair taps on RLE, 8" hurdles, SLS on airex RLE, may need to practice cane on gravel-pt has packed gravel driveway and is nervous about this, static balance-EC (add to HEP?)   Debbora Dus, PT, DPT Debbora Dus, PT, DPT, CBIS   10/05/2021, 10:18 AM

## 2021-10-10 ENCOUNTER — Ambulatory Visit: Payer: Medicare Other

## 2021-10-10 DIAGNOSIS — M6281 Muscle weakness (generalized): Secondary | ICD-10-CM | POA: Diagnosis not present

## 2021-10-10 DIAGNOSIS — R278 Other lack of coordination: Secondary | ICD-10-CM | POA: Diagnosis not present

## 2021-10-10 DIAGNOSIS — R2681 Unsteadiness on feet: Secondary | ICD-10-CM | POA: Diagnosis not present

## 2021-10-10 DIAGNOSIS — R262 Difficulty in walking, not elsewhere classified: Secondary | ICD-10-CM | POA: Diagnosis not present

## 2021-10-10 NOTE — Therapy (Signed)
OUTPATIENT PHYSICAL THERAPY NEURO TREATMENT   Patient Name: Monica Hinton MRN: [id number] DOB:August 21, 1945, 76 y.o., female Today's Date: 10/10/2021   PCP: Orpah Melter, MD REFERRING PROVIDER: Elodia Florence., MD     PT End of Session - 10/10/21 [id number]     Visit Number 18    Number of Visits 21    Date for PT Re-Evaluation 10/18/21    Authorization Type [employer] medicare    Progress Note Due on Visit 20    PT Start Time 0926    PT Stop Time 1011    PT Time Calculation (min) 45 min    Equipment Utilized During Treatment Gait belt    Activity Tolerance Patient tolerated treatment well    Behavior During Therapy WFL for tasks assessed/performed             Past Medical History:  Diagnosis Date   Allergic rhinitis    Asthma    seasonal    Bursitis    r hip    GERD (gastroesophageal reflux disease)    High cholesterol    Hypercholesteremia    Hypertension    Hypothyroidism    Hypothyroidism    Insomnia    Leukocytosis    Obesity    Peripheral vascular disease ([place])    bilateral LE edema ; edma in hands as well    PONV (postoperative nausea and vomiting)    Thrombocytosis    Vitamin D deficiency    Past Surgical History:  Procedure Laterality Date   bladder      bladder tack  20 years ago    COLONOSCOPY     EYE SURGERY Bilateral    catracts   hysterectomy      20 years    LUMBAR FUSION     OPEN SURGICAL REPAIR OF GLUTEAL TENDON Right 09/19/2016   Procedure: Right hip bursectomy and gluteal tendon repair;  Surgeon: Gaynelle Arabian, MD;  Location: [facility];  Service: Orthopedics;  Laterality: Right;   TONSILLECTOMY     age 59    TOTAL ANKLE ARTHROPLASTY Right 04/27/2021   Procedure: TOTAL ANKLE ARTHROPLASTY;  Surgeon: Wylene Simmer, MD;  Location: [place];  Service: Orthopedics;  Laterality: Right;   Patient Active Problem List   Diagnosis Date Noted   Syncope 08/17/2021   Leukocytosis 08/17/2021   History of CVA (cerebrovascular  accident) 08/17/2021   Hormone replacement therapy 07/25/2021   Essential hypertension 07/25/2021   Dyslipidemia 07/25/2021   Asthma, chronic 07/25/2021   Acute CVA (cerebrovascular accident) ([place]) 07/24/2021   Spondylolisthesis of lumbar region 09/10/2019   Tendinopathy of right gluteus medius 09/19/2016    ONSET DATE: 07/23/21  REFERRING DIAG: 63.9 (ICD-10-CM) - Acute CVA (cerebrovascular accident) ([place])   THERAPY DIAG:  Muscle weakness (generalized)  Other lack of coordination  Unsteadiness on feet  Difficulty in walking, not elsewhere classified  Rationale for Evaluation and Treatment Rehabilitation  SUBJECTIVE:  SUBJECTIVE STATEMENT: Patient reports doing well. Has been increasing her activity well. Denies falls or near falls.   Pt accompanied by: significant other [name]  PERTINENT HISTORY: HTN,HLD,hypothyroidism,asthma PVD chronic b/l lower extremity edema, recent ankle replacement ~04/2021  PAIN:  Are you having pain? No-would not consider her ankle soreness to be painful  PRECAUTIONS: Fall  WEIGHT BEARING RESTRICTIONS No   TODAY'S TREATMENT:  NMR: -exaggerated walking with B hiking poles for improved R LE trailing limb x3 laps -carryover to exaggerated gait with no AD x3 laps  -fwd lunge onto 2nd step B UE support 2x12  -fwd lunge onto airex U UE support (decreased R hip engagement for stability)  -fwd lunge onto [place] U UE support  -hooklying bridge + U LE HSC with physioball 1x12    PATIENT EDUCATION: Education details: Continue HEP Person educated: Patient and Spouse Education method: Explanation, Demonstration, and Handouts Education comprehension: verbalized understanding   HOME EXERCISE PROGRAM: Access Code: [email or web address] URL: [email or web address] Date:  10/05/2021 Prepared by: Estevan Ryder  Exercises - Heel Raises with Counter Support  - 1 x daily - 7 x weekly - 3 sets - 10 reps - Clamshell with Resistance  - 1 x daily - 7 x weekly - 3 sets - 10 reps - Staggered Sit-to-Stand  - 1 x daily - 7 x weekly - 3 sets - 10 reps - Standing Single Leg Stance with Counter Support  - 1 x daily - 7 x weekly - 1 sets - 5 reps - 30 sec hold -forward rock and reach   Standing Lunge at [place] of Stairs Stand at the bottom of the stairs holding onto your handrail. Lift your left foot up to the 2nd step and lean forwards until you feel a stretch in your R ankle. You can do this stretch about 5 times, holding for about 30 sec each.  GOALS: Goals reviewed with patient? Yes  SHORT TERM GOALS: Target date: 09/13/2021  Pt will be independent with initial HEP for improved balance and gait mechanics  Baseline: to be provided Goal status: MET  2.  Pt will improve FGA to >/= 12/30 to demonstrate improved balance and reduced fall risk  Baseline: 7/30, 14/30 (8/29) Goal status: MET  3.  Pt will improve gait speed to >/= 1.45m/s to demonstrate improved community ambulation  Baseline: .8m/s with rollator, .60m/s with rollator  Goal status: IN PROGRESS   LONG TERM GOALS: Target date: 10/18/2021  Pt will be independent with final HEP for improved balance and gait mechanics   Baseline: to be provided Goal status: INITIAL  2.  Pt will improve FGA to >/= 17/30 to demonstrate improved balance and reduced fall risk  Baseline: 7/30, 14/30 (8/29) Goal status: INITIAL  3.  Pt will improve gait speed to >/= 1.70m/s to demonstrate improved community ambulation  Baseline: .6m/s with rollator, .88m/s with rollator  Goal status: REVISED  ASSESSMENT:  CLINICAL IMPRESSION: Patient seen for skilled PT session with emphasis on gait retraining and R LE NMR. Patient able to complete exaggerated gait with appropriate engagement of R hip stabilizers and noted to have  some carryover to not using walking poles. With fatigue trendelenburg does return noting poor endurance of this muscle group. Patient with decreased proprioceptive input to R LE with noted difficulty lunging onto unstable surfaces. Continue POC.    OBJECTIVE IMPAIRMENTS Abnormal gait, decreased activity tolerance, decreased balance, decreased endurance, decreased knowledge of condition, decreased knowledge of use of DME, difficulty walking, and decreased strength.  ACTIVITY LIMITATIONS carrying, lifting, bending, standing, squatting, stairs, and locomotion level  PARTICIPATION LIMITATIONS: meal prep, cleaning, laundry, driving, shopping, and community activity  PERSONAL FACTORS Age, Fitness, Past/current experiences, and 3+ comorbidities: HTN HLD, PVD, LE edema, recent R ankle replacement   are also affecting patient's functional outcome.   REHAB POTENTIAL: Good  CLINICAL DECISION MAKING: Stable/uncomplicated  EVALUATION COMPLEXITY: Low  PLAN: PT FREQUENCY: 2x/week  PT DURATION: 10 weeks  PLANNED INTERVENTIONS: Therapeutic exercises, Therapeutic activity, Neuromuscular re-education, Balance training, Gait training, Patient/Family education, Self Care, Joint mobilization, Joint manipulation, Stair training, Vestibular training, Visual/preceptual remediation/compensation, Orthotic/Fit training, DME instructions, Aquatic Therapy, Dry Needling, Cognitive remediation, Electrical stimulation, Spinal manipulation, Spinal mobilization, Cryotherapy, Moist heat, Manual lymph drainage, scar mobilization, Taping, Manual therapy, and Re-evaluation  PLAN FOR NEXT SESSION: continue to work on balance, gait mechanics, R ankle strengthening, eccentric step-downs, increasing stance time on RLE, R hip strengthening, continue stairs w/ quad rubber tip SPC no rail-descent, airex stair taps on RLE, 8" hurdles, SLS on airex RLE, may need to practice cane on gravel-pt has packed gravel driveway and is nervous about  this, static balance-EC (add to HEP?)   Debbora Dus, PT, DPT Debbora Dus, PT, DPT, CBIS   10/10/2021, 10:14 AM

## 2021-10-12 ENCOUNTER — Ambulatory Visit: Payer: Medicare Other

## 2021-10-12 DIAGNOSIS — M6281 Muscle weakness (generalized): Secondary | ICD-10-CM

## 2021-10-12 DIAGNOSIS — R278 Other lack of coordination: Secondary | ICD-10-CM | POA: Diagnosis not present

## 2021-10-12 DIAGNOSIS — R262 Difficulty in walking, not elsewhere classified: Secondary | ICD-10-CM | POA: Diagnosis not present

## 2021-10-12 DIAGNOSIS — R2681 Unsteadiness on feet: Secondary | ICD-10-CM | POA: Diagnosis not present

## 2021-10-12 NOTE — Therapy (Signed)
OUTPATIENT PHYSICAL THERAPY NEURO TREATMENT   Patient Name: Monica Hinton MRN: [id number] DOB:23-Dec-1945, 76 y.o., female Today's Date: 10/12/2021  PCP: Orpah Melter, MD REFERRING PROVIDER: Elodia Florence., MD     PT End of Session - 10/12/21 [id number]     Visit Number 19    Number of Visits 21    Date for PT Re-Evaluation 10/18/21    Authorization Type [employer] medicare    Progress Note Due on Visit 20    PT Start Time 0930    PT Stop Time 1013    PT Time Calculation (min) 43 min    Equipment Utilized During Treatment Gait belt    Activity Tolerance Patient tolerated treatment well    Behavior During Therapy WFL for tasks assessed/performed             Past Medical History:  Diagnosis Date   Allergic rhinitis    Asthma    seasonal    Bursitis    r hip    GERD (gastroesophageal reflux disease)    High cholesterol    Hypercholesteremia    Hypertension    Hypothyroidism    Hypothyroidism    Insomnia    Leukocytosis    Obesity    Peripheral vascular disease ([place])    bilateral LE edema ; edma in hands as well    PONV (postoperative nausea and vomiting)    Thrombocytosis    Vitamin D deficiency    Past Surgical History:  Procedure Laterality Date   bladder      bladder tack  20 years ago    COLONOSCOPY     EYE SURGERY Bilateral    catracts   hysterectomy      20 years    LUMBAR FUSION     OPEN SURGICAL REPAIR OF GLUTEAL TENDON Right 09/19/2016   Procedure: Right hip bursectomy and gluteal tendon repair;  Surgeon: Gaynelle Arabian, MD;  Location: [facility];  Service: Orthopedics;  Laterality: Right;   TONSILLECTOMY     age 80    TOTAL ANKLE ARTHROPLASTY Right 04/27/2021   Procedure: TOTAL ANKLE ARTHROPLASTY;  Surgeon: Wylene Simmer, MD;  Location: [place];  Service: Orthopedics;  Laterality: Right;   Patient Active Problem List   Diagnosis Date Noted   Syncope 08/17/2021   Leukocytosis 08/17/2021   History of CVA (cerebrovascular  accident) 08/17/2021   Hormone replacement therapy 07/25/2021   Essential hypertension 07/25/2021   Dyslipidemia 07/25/2021   Asthma, chronic 07/25/2021   Acute CVA (cerebrovascular accident) ([place]) 07/24/2021   Spondylolisthesis of lumbar region 09/10/2019   Tendinopathy of right gluteus medius 09/19/2016    ONSET DATE: 07/23/21  REFERRING DIAG: 63.9 (ICD-10-CM) - Acute CVA (cerebrovascular accident) ([place])   THERAPY DIAG:  Muscle weakness (generalized)  Other lack of coordination  Unsteadiness on feet  Difficulty in walking, not elsewhere classified  Rationale for Evaluation and Treatment Rehabilitation  SUBJECTIVE:  SUBJECTIVE STATEMENT: Patient reports doing well. Walked over grass without issue holding onto husband.   Pt accompanied by: significant other [name]  PERTINENT HISTORY: HTN,HLD,hypothyroidism,asthma PVD chronic b/l lower extremity edema, recent ankle replacement ~04/2021  PAIN:  Are you having pain? No-would not consider her ankle soreness to be painful  PRECAUTIONS: Fall  WEIGHT BEARING RESTRICTIONS No   TODAY'S TREATMENT:  Gait: -230' exaggerated gait with increased instance of shortened L step length  -treadmill x8 mins (1.81mh-1.4mph) limited ability to engage R hip stabilizers, premature weight shift back to L)   NMR:  -sit <> stand with blue theraband 2x8 (good carryover to no theraband noted)  -lateral walking with minisquat + blue theraband at counter 2x4 laps -fwd/backward walking at counter with blue theraband -scifit hills level 3 x10 mins   PATIENT EDUCATION: Education details: Continue HEP, PT POC Person educated: Patient and Spouse Education method: Explanation, Demonstration, and Handouts Education comprehension: verbalized understanding   HOME  EXERCISE PROGRAM: Access Code: [email or web address] URL: [email or web address] Date: 10/05/2021 Prepared by: JEstevan Ryder Exercises - Heel Raises with Counter Support  - 1 x daily - 7 x weekly - 3 sets - 10 reps - Clamshell with Resistance  - 1 x daily - 7 x weekly - 3 sets - 10 reps - Staggered Sit-to-Stand  - 1 x daily - 7 x weekly - 3 sets - 10 reps - Standing Single Leg Stance with Counter Support  - 1 x daily - 7 x weekly - 1 sets - 5 reps - 30 sec hold -forward rock and reach   Standing Lunge at [place] Stairs Stand at the bottom of the stairs holding onto your handrail. Lift your left foot up to the 2nd step and lean forwards until you feel a stretch in your R ankle. You can do this stretch about 5 times, holding for about 30 sec each.  GOALS: Goals reviewed with patient? Yes  SHORT TERM GOALS: Target date: 09/13/2021  Pt will be independent with initial HEP for improved balance and gait mechanics  Baseline: to be provided Goal status: MET  2.  Pt will improve FGA to >/= 12/30 to demonstrate improved balance and reduced fall risk  Baseline: 7/30, 14/30 (8/29) Goal status: MET  3.  Pt will improve gait speed to >/= 1.01m to demonstrate improved community ambulation  Baseline: .2975mwith rollator, .53m64mith rollator  Goal status: IN PROGRESS   LONG TERM GOALS: Target date: 10/18/2021  Pt will be independent with final HEP for improved balance and gait mechanics   Baseline: to be provided Goal status: INITIAL  2.  Pt will improve FGA to >/= 17/30 to demonstrate improved balance and reduced fall risk  Baseline: 7/30, 14/30 (8/29) Goal status: INITIAL  3.  Pt will improve gait speed to >/= 1.62m/s61m demonstrate improved community ambulation  Baseline: .37m/s70mh rollator, .53m/s 15m rollator  Goal status: REVISED  ASSESSMENT:  CLINICAL IMPRESSION: Patient seen for skilled PT session with emphasis on R LE NMR and gait retraining. Patient remains with  decreased R hip lateral stabilizer engagement. On treadmill, patient relying on L UE to assist with full weight shift R. Discussed continuing with HEP on her own and utilizing intrinsic feedback on posture outside of PT. Continue POC.    OBJECTIVE IMPAIRMENTS Abnormal gait, decreased activity tolerance, decreased balance, decreased endurance, decreased knowledge of condition, decreased knowledge of use of DME, difficulty walking, and decreased strength.   ACTIVITY LIMITATIONS carrying, lifting, bending, standing, squatting,  stairs, and locomotion level  PARTICIPATION LIMITATIONS: meal prep, cleaning, laundry, driving, shopping, and community activity  PERSONAL FACTORS Age, Fitness, Past/current experiences, and 3+ comorbidities: HTN HLD, PVD, LE edema, recent R ankle replacement   are also affecting patient's functional outcome.   REHAB POTENTIAL: Good  CLINICAL DECISION MAKING: Stable/uncomplicated  EVALUATION COMPLEXITY: Low  PLAN: PT FREQUENCY: 2x/week  PT DURATION: 10 weeks  PLANNED INTERVENTIONS: Therapeutic exercises, Therapeutic activity, Neuromuscular re-education, Balance training, Gait training, Patient/Family education, Self Care, Joint mobilization, Joint manipulation, Stair training, Vestibular training, Visual/preceptual remediation/compensation, Orthotic/Fit training, DME instructions, Aquatic Therapy, Dry Needling, Cognitive remediation, Electrical stimulation, Spinal manipulation, Spinal mobilization, Cryotherapy, Moist heat, Manual lymph drainage, scar mobilization, Taping, Manual therapy, and Re-evaluation  PLAN FOR NEXT SESSION: continue to work on balance, gait mechanics, R ankle strengthening, eccentric step-downs, increasing stance time on RLE, R hip strengthening, continue stairs w/ quad rubber tip SPC no rail-descent, airex stair taps on RLE, 8" hurdles, SLS on airex RLE, may need to practice cane on gravel-pt has packed gravel driveway and is nervous about this,  static balance-EC (add to HEP?)   Debbora Dus, PT, DPT Debbora Dus, PT, DPT, CBIS   10/12/2021, 10:16 AM

## 2021-10-17 ENCOUNTER — Ambulatory Visit: Payer: Medicare Other | Attending: Family Medicine

## 2021-10-17 DIAGNOSIS — R278 Other lack of coordination: Secondary | ICD-10-CM | POA: Diagnosis not present

## 2021-10-17 DIAGNOSIS — R2681 Unsteadiness on feet: Secondary | ICD-10-CM | POA: Insufficient documentation

## 2021-10-17 DIAGNOSIS — M6281 Muscle weakness (generalized): Secondary | ICD-10-CM | POA: Diagnosis not present

## 2021-10-17 DIAGNOSIS — R262 Difficulty in walking, not elsewhere classified: Secondary | ICD-10-CM | POA: Diagnosis not present

## 2021-10-17 NOTE — Therapy (Signed)
OUTPATIENT PHYSICAL THERAPY NEURO TREATMENT/PROGRESS NOTE   Patient Name: Monica Hinton MRN: [id number] DOB:[birth year], 76 y.o., female Today's Date: 10/17/2021  PCP: Orpah Melter, MD REFERRING PROVIDER: Elodia Florence., MD   Physical Therapy Progress Note   Dates of Reporting Period:09/12/21- 10/17/21  See Note below for Objective Data and Assessment of Progress/Goals.  Thank you for the referral of this patient. Debbora Dus, PT, DPT, CBIS    PT End of Session - 10/17/21 0931     Visit Number 20    Number of Visits 21    Date for PT Re-Evaluation 10/18/21    Authorization Type [employer] medicare    Progress Note Due on Visit 20    PT Start Time 0930    PT Stop Time 1015    PT Time Calculation (min) 45 min    Equipment Utilized During Treatment Gait belt    Activity Tolerance Patient tolerated treatment well    Behavior During Therapy WFL for tasks assessed/performed             Past Medical History:  Diagnosis Date   Allergic rhinitis    Asthma    seasonal    Bursitis    r hip    GERD (gastroesophageal reflux disease)    High cholesterol    Hypercholesteremia    Hypertension    Hypothyroidism    Hypothyroidism    Insomnia    Leukocytosis    Obesity    Peripheral vascular disease (HCC)    bilateral LE edema ; edma in hands as well    PONV (postoperative nausea and vomiting)    Thrombocytosis    Vitamin D deficiency    Past Surgical History:  Procedure Laterality Date   bladder      bladder tack  20 years ago    COLONOSCOPY     EYE SURGERY Bilateral    catracts   hysterectomy      20 years    LUMBAR FUSION     OPEN SURGICAL REPAIR OF GLUTEAL TENDON Right 09/19/2016   Procedure: Right hip bursectomy and gluteal tendon repair;  Surgeon: Gaynelle Arabian, MD;  Location: [facility];  Service: Orthopedics;  Laterality: Right;   TONSILLECTOMY     age 66    TOTAL ANKLE ARTHROPLASTY Right 04/27/2021   Procedure: TOTAL ANKLE ARTHROPLASTY;   Surgeon: Wylene Simmer, MD;  Location: [facility];  Service: Orthopedics;  Laterality: Right;   Patient Active Problem List   Diagnosis Date Noted   Syncope 08/17/2021   Leukocytosis 08/17/2021   History of CVA (cerebrovascular accident) 08/17/2021   Hormone replacement therapy 07/25/2021   Essential hypertension 07/25/2021   Dyslipidemia 07/25/2021   Asthma, chronic 07/25/2021   Acute CVA (cerebrovascular accident) ([place]) 07/24/2021   Spondylolisthesis of lumbar region 09/10/2019   Tendinopathy of right gluteus medius 09/19/2016    ONSET DATE: 07/23/21  REFERRING DIAG: 63.9 (ICD-10-CM) - Acute CVA (cerebrovascular accident) ([place])   THERAPY DIAG:  Muscle weakness (generalized)  Other lack of coordination  Unsteadiness on feet  Difficulty in walking, not elsewhere classified  Rationale for Evaluation and Treatment Rehabilitation  SUBJECTIVE:  SUBJECTIVE STATEMENT: Patient reports doing well. Did some exercises this weekend and some walking without issue.   Pt accompanied by: significant other [name]  PERTINENT HISTORY: HTN,HLD,hypothyroidism,asthma PVD chronic b/l lower extremity edema, recent ankle replacement ~04/2021  PAIN:  Are you having pain? No-would not consider her ankle soreness to be painful  PRECAUTIONS: Fall  WEIGHT BEARING RESTRICTIONS No   TODAY'S TREATMENT:  Gait: -230' exaggerated gait with increased instance of shortened L step length   NMR: -SLS in // bars, emphasis on R hip stabilizer engagement -tandem stance on foam balance beam (R UE support only)  -tandem gait on foam balance beam R UE support  -standing on foam  -scar mobilization to R anterior ankle    PATIENT EDUCATION: Education details: Continue HEP, PT POC Person educated: Patient and  Spouse Education method: Explanation, Demonstration, and Handouts Education comprehension: verbalized understanding   HOME EXERCISE PROGRAM: Access Code: [email or web address] URL: [email or web address] Date: 10/05/2021 Prepared by: Estevan Ryder  Exercises - Heel Raises with Counter Support  - 1 x daily - 7 x weekly - 3 sets - 10 reps - Clamshell with Resistance  - 1 x daily - 7 x weekly - 3 sets - 10 reps - Staggered Sit-to-Stand  - 1 x daily - 7 x weekly - 3 sets - 10 reps - Standing Single Leg Stance with Counter Support  - 1 x daily - 7 x weekly - 1 sets - 5 reps - 30 sec hold -forward rock and reach   Standing Lunge at [place] of Stairs Stand at the bottom of the stairs holding onto your handrail. Lift your left foot up to the 2nd step and lean forwards until you feel a stretch in your R ankle. You can do this stretch about 5 times, holding for about 30 sec each.  GOALS: Goals reviewed with patient? Yes  SHORT TERM GOALS: Target date: 09/13/2021  Pt will be independent with initial HEP for improved balance and gait mechanics  Baseline: to be provided Goal status: MET  2.  Pt will improve FGA to >/= 12/30 to demonstrate improved balance and reduced fall risk  Baseline: 7/30, 14/30 (8/29) Goal status: MET  3.  Pt will improve gait speed to >/= 1.11ms to demonstrate improved community ambulation  Baseline: .240m with rollator, .8434mwith rollator  Goal status: IN PROGRESS   LONG TERM GOALS: Target date: 10/18/2021  Pt will be independent with final HEP for improved balance and gait mechanics   Baseline: to be provided Goal status: INITIAL  2.  Pt will improve FGA to >/= 17/30 to demonstrate improved balance and reduced fall risk  Baseline: 7/30, 14/30 (8/29) Goal status: INITIAL  3.  Pt will improve gait speed to >/= 1.49m/40mo demonstrate improved community ambulation  Baseline: .36m/149mth rollator, .44m/s47mh rollator  Goal status:  REVISED  ASSESSMENT:  CLINICAL IMPRESSION: Patient seen for skilled PT session with emphasis on R LE NMR and gait retraining. Patient with some improvement noted in R hip engagement, but limited endurance to maintain this throughout longer distance gait/therapeutic tasks. She does seem to do better with visual feedback, but again limited in endurance. Continue POC.    OBJECTIVE IMPAIRMENTS Abnormal gait, decreased activity tolerance, decreased balance, decreased endurance, decreased knowledge of condition, decreased knowledge of use of DME, difficulty walking, and decreased strength.   ACTIVITY LIMITATIONS carrying, lifting, bending, standing, squatting, stairs, and locomotion level  PARTICIPATION LIMITATIONS: meal prep, cleaning, laundry, driving, shopping, and community activity  PERSONAL  FACTORS Age, Fitness, Past/current experiences, and 3+ comorbidities: HTN HLD, PVD, LE edema, recent R ankle replacement   are also affecting patient's functional outcome.   REHAB POTENTIAL: Good  CLINICAL DECISION MAKING: Stable/uncomplicated  EVALUATION COMPLEXITY: Low  PLAN: PT FREQUENCY: 2x/week  PT DURATION: 10 weeks  PLANNED INTERVENTIONS: Therapeutic exercises, Therapeutic activity, Neuromuscular re-education, Balance training, Gait training, Patient/Family education, Self Care, Joint mobilization, Joint manipulation, Stair training, Vestibular training, Visual/preceptual remediation/compensation, Orthotic/Fit training, DME instructions, Aquatic Therapy, Dry Needling, Cognitive remediation, Electrical stimulation, Spinal manipulation, Spinal mobilization, Cryotherapy, Moist heat, Manual lymph drainage, scar mobilization, Taping, Manual therapy, and Re-evaluation  PLAN FOR NEXT SESSION: continue to work on balance, gait mechanics, R ankle strengthening, eccentric step-downs, increasing stance time on RLE, R hip strengthening, continue stairs w/ quad rubber tip SPC no rail-descent, airex stair  taps on RLE, 8" hurdles, SLS on airex RLE, may need to practice cane on gravel-pt has packed gravel driveway and is nervous about this, static balance-EC (add to HEP?); check goals and possible re-cert?   Debbora Dus, PT, DPT Debbora Dus, PT, DPT, CBIS   10/17/2021, 10:31 AM

## 2021-10-18 DIAGNOSIS — M19071 Primary osteoarthritis, right ankle and foot: Secondary | ICD-10-CM | POA: Diagnosis not present

## 2021-10-18 DIAGNOSIS — Z96661 Presence of right artificial ankle joint: Secondary | ICD-10-CM | POA: Diagnosis not present

## 2021-10-19 ENCOUNTER — Ambulatory Visit: Payer: Medicare Other

## 2021-10-19 DIAGNOSIS — M6281 Muscle weakness (generalized): Secondary | ICD-10-CM | POA: Diagnosis not present

## 2021-10-19 DIAGNOSIS — R2681 Unsteadiness on feet: Secondary | ICD-10-CM | POA: Diagnosis not present

## 2021-10-19 DIAGNOSIS — R278 Other lack of coordination: Secondary | ICD-10-CM

## 2021-10-19 DIAGNOSIS — R262 Difficulty in walking, not elsewhere classified: Secondary | ICD-10-CM | POA: Diagnosis not present

## 2021-10-19 NOTE — Therapy (Signed)
OUTPATIENT PHYSICAL THERAPY NEURO TREATMENT/RE-CERT   Patient Name: Monica Hinton MRN: [id number] DOB:Jul 21, 1945, 76 y.o., female Today's Date: 10/19/2021  PCP: Orpah Melter, MD REFERRING PROVIDER: Elodia Florence., MD     PT End of Session - 10/19/21 [phone]     Visit Number 21    Number of Visits 27   re-cert   Date for PT Re-Evaluation 11/94/17   re-cert   Authorization Type [employer] medicare    Progress Note Due on Visit 30    PT Start Time 0930    PT Stop Time 1010    PT Time Calculation (min) 40 min    Equipment Utilized During Treatment Gait belt    Activity Tolerance Patient tolerated treatment well    Behavior During Therapy WFL for tasks assessed/performed             Past Medical History:  Diagnosis Date   Allergic rhinitis    Asthma    seasonal    Bursitis    r hip    GERD (gastroesophageal reflux disease)    High cholesterol    Hypercholesteremia    Hypertension    Hypothyroidism    Hypothyroidism    Insomnia    Leukocytosis    Obesity    Peripheral vascular disease ([place])    bilateral LE edema ; edma in hands as well    PONV (postoperative nausea and vomiting)    Thrombocytosis    Vitamin D deficiency    Past Surgical History:  Procedure Laterality Date   bladder      bladder tack  20 years ago    COLONOSCOPY     EYE SURGERY Bilateral    catracts   hysterectomy      20 years    LUMBAR FUSION     OPEN SURGICAL REPAIR OF GLUTEAL TENDON Right 09/19/2016   Procedure: Right hip bursectomy and gluteal tendon repair;  Surgeon: Gaynelle Arabian, MD;  Location: [facility];  Service: Orthopedics;  Laterality: Right;   TONSILLECTOMY     age 89    TOTAL ANKLE ARTHROPLASTY Right 04/27/2021   Procedure: TOTAL ANKLE ARTHROPLASTY;  Surgeon: Wylene Simmer, MD;  Location: [facility];  Service: Orthopedics;  Laterality: Right;   Patient Active Problem List   Diagnosis Date Noted   Syncope 08/17/2021   Leukocytosis 08/17/2021   History of  CVA (cerebrovascular accident) 08/17/2021   Hormone replacement therapy 07/25/2021   Essential hypertension 07/25/2021   Dyslipidemia 07/25/2021   Asthma, chronic 07/25/2021   Acute CVA (cerebrovascular accident) ([place]) 07/24/2021   Spondylolisthesis of lumbar region 09/10/2019   Tendinopathy of right gluteus medius 09/19/2016    ONSET DATE: 07/23/21  REFERRING DIAG: 63.9 (ICD-10-CM) - Acute CVA (cerebrovascular accident) ([place])   THERAPY DIAG:  Muscle weakness (generalized)  Other lack of coordination  Unsteadiness on feet  Difficulty in walking, not elsewhere classified  Rationale for Evaluation and Treatment Rehabilitation  SUBJECTIVE:  SUBJECTIVE STATEMENT: Patient reports doing well. Says that ankle MD said that her ankle has significantly improved, but did recommend continuing PT for general R LE strengthening. Previously discussed with patient that building strength takes time and so long as she's doing her HEP, which she is, strength should continue to return.   Pt accompanied by: significant other [name]  PERTINENT HISTORY: HTN,HLD,hypothyroidism,asthma PVD chronic b/l lower extremity edema, recent ankle replacement ~04/2021  PAIN:  Are you having pain? No PRECAUTIONS: Fall  WEIGHT BEARING RESTRICTIONS No   TODAY'S TREATMENT:   [facility] PT Assessment - 10/19/21 0001       Standardized Balance Assessment   10 Meter Walk 1.39m/s      Functional Gait  Assessment   Gait assessed  Yes    Gait Level Surface Walks 20 ft in less than 7 sec but greater than 5.5 sec, uses assistive device, slower speed, mild gait deviations, or deviates 6-10 in outside of the 12 in walkway width.    Change in Gait Speed Able to change speed, demonstrates mild gait deviations, deviates 6-10 in outside of the 12  in walkway width, or no gait deviations, unable to achieve a major change in velocity, or uses a change in velocity, or uses an assistive device.    Gait with Horizontal Head Turns Performs head turns smoothly with slight change in gait velocity (eg, minor disruption to smooth gait path), deviates 6-10 in outside 12 in walkway width, or uses an assistive device.    Gait with Vertical Head Turns Performs task with slight change in gait velocity (eg, minor disruption to smooth gait path), deviates 6 - 10 in outside 12 in walkway width or uses assistive device    Gait and Pivot Turn Pivot turns safely in greater than 3 sec and stops with no loss of balance, or pivot turns safely within 3 sec and stops with mild imbalance, requires small steps to catch balance.    Step Over Obstacle Is able to step over one shoe box (4.5 in total height) but must slow down and adjust steps to clear box safely. May require verbal cueing.    Gait with Narrow Base of Support Ambulates less than 4 steps heel to toe or cannot perform without assistance.    Gait with Eyes Closed Walks 20 ft, uses assistive device, slower speed, mild gait deviations, deviates 6-10 in outside 12 in walkway width. Ambulates 20 ft in less than 9 sec but greater than 7 sec.    Ambulating Backwards Walks 20 ft, slow speed, abnormal gait pattern, evidence for imbalance, deviates 10-15 in outside 12 in walkway width.    Steps Alternating feet, must use rail.    Total Score 16            Self care/ home management:  -extensive discussion regarding PT POC and weaning off of therapy + rehab potential toward remaining deficits   PATIENT EDUCATION: Education details: Continue HEP, PT POC Person educated: Patient and Spouse Education method: Explanation, Demonstration, and Handouts Education comprehension: verbalized understanding   HOME EXERCISE PROGRAM: Access Code: [email or web address] URL: [email or web address] Date: 10/05/2021 Prepared  by: Estevan Ryder  Exercises - Heel Raises with Counter Support  - 1 x daily - 7 x weekly - 3 sets - 10 reps - Clamshell with Resistance  - 1 x daily - 7 x weekly - 3 sets - 10 reps - Staggered Sit-to-Stand  - 1 x daily - 7 x weekly - 3 sets - 10 reps -  Standing Single Leg Stance with Counter Support  - 1 x daily - 7 x weekly - 1 sets - 5 reps - 30 sec hold -forward rock and reach  - Supine Hamstring Curl on Swiss Ball  - 1 x daily - 7 x weekly - 3 sets - 10 reps  Standing Lunge at [place] of Stairs Stand at the bottom of the stairs holding onto your handrail. Lift your left foot up to the 2nd step and lean forwards until you feel a stretch in your R ankle. You can do this stretch about 5 times, holding for about 30 sec each.  GOALS: Goals reviewed with patient? Yes  SHORT TERM GOALS: Target date: 09/13/2021  Pt will be independent with initial HEP for improved balance and gait mechanics  Baseline: to be provided Goal status: MET  2.  Pt will improve FGA to >/= 12/30 to demonstrate improved balance and reduced fall risk  Baseline: 7/30, 14/30 (8/29) Goal status: MET  3.  Pt will improve gait speed to >/= 1.87m/s to demonstrate improved community ambulation  Baseline: .53m/s with rollator, .74m/s with rollator  Goal status: IN PROGRESS   LONG TERM GOALS: Target date: 10/18/2021  Pt will be independent with final HEP for improved balance and gait mechanics   Baseline: to be provided; provided Goal status: MET  2.  Pt will improve FGA to >/= 17/30 to demonstrate improved balance and reduced fall risk  Baseline: 7/30, 14/30 (8/29); 16/30 Goal status: NOT MET  3.  Pt will improve gait speed to >/= 1.37m/s to demonstrate improved community ambulation  Baseline: .83m/s with rollator, .19m/s with rollator;  1.23m/s with no AD Goal status: MET   NEW SHORT TERM GOALS: Target date: 12/01/21 Pt will be independent with updated HEP for improved balance    Baseline: to be updated  prn   Goal status: NEW    2. Pt will improve FGA to >/= 21/30 to demonstrate improved balance and reduced fall risk   Baseline: 16/30   Goal status: NEW  NEW LONG TERM GOALS: Target date: 01/11/22  1. Pt will be independent with final HEP for improved balance Baseline: to be updated Goal status: NEW  2. Pt will improve FGA to >/= 26/30 to demonstrate improved balance and reduced fall risk  Baseline: 16/30  Goal status: NEW     ASSESSMENT:  CLINICAL IMPRESSION: Patient seen for skilled PT session with emphasis on re-cert and goal assessment. She met 2/3 LTG and is making good progress toward remaining goal. Patient demonstrates increased fall risk as noted by score of 16/30 on  Functional Gait Assessment.   <22/30 = predictive of falls, <20/30 = fall in 6 months, <18/30 = predictive of falls in PD MCID: 5 points stroke population, 4 points geriatric population (ANPTA Core Set of Outcome Measures for Adults with Neurologic Conditions, 2018). 10 Meter Walk Test: Patient instructed to walk 10 meters (32.8 ft) as quickly and as safely as possible at their normal speed x2 and at a fast speed x2. Time measured from 2 meter mark to 8 meter mark to accommodate ramp-up and ramp-down.  Normal speed: 1.28m/s with no AD Cut off scores: <0.4 m/s = household Ambulator, 0.4-0.8 m/s = limited community Ambulator, >0.8 m/s = community Ambulator, >1.2 m/s = crossing a street, <1.0 = increased fall risk MCID 0.05 m/s (small), 0.13 m/s (moderate), 0.06 m/s (significant)  (ANPTA Core Set of Outcome Measures for Adults with Neurologic Conditions, 2018). She is still considered  a fall risk and would benefit from further PT services to progress R LE NMR and functional strength. Continue POC.    OBJECTIVE IMPAIRMENTS Abnormal gait, decreased activity tolerance, decreased balance, decreased endurance, decreased knowledge of condition, decreased knowledge of use of DME, difficulty walking, and decreased strength.    ACTIVITY LIMITATIONS carrying, lifting, bending, standing, squatting, stairs, and locomotion level  PARTICIPATION LIMITATIONS: meal prep, cleaning, laundry, driving, shopping, and community activity  PERSONAL FACTORS Age, Fitness, Past/current experiences, and 3+ comorbidities: HTN HLD, PVD, LE edema, recent R ankle replacement   are also affecting patient's functional outcome.   REHAB POTENTIAL: Good  CLINICAL DECISION MAKING: Stable/uncomplicated  EVALUATION COMPLEXITY: Low  PLAN: PT FREQUENCY: 2x/week1x/week every other week   PT DURATION: 10 weeks 12 weeks (total of 6 more visits)   PLANNED INTERVENTIONS: Therapeutic exercises, Therapeutic activity, Neuromuscular re-education, Balance training, Gait training, Patient/Family education, Self Care, Joint mobilization, Joint manipulation, Stair training, Vestibular training, Visual/preceptual remediation/compensation, Orthotic/Fit training, DME instructions, Aquatic Therapy, Dry Needling, Cognitive remediation, Electrical stimulation, Spinal manipulation, Spinal mobilization, Cryotherapy, Moist heat, Manual lymph drainage, scar mobilization, Taping, Manual therapy, and Re-evaluation  PLAN FOR NEXT SESSION: continue to work on balance, gait mechanics, R ankle strengthening, eccentric step-downs, increasing stance time on RLE, R hip strengthening, continue stairs w/ quad rubber tip SPC no rail-descent, airex stair taps on RLE, 8" hurdles, SLS on airex RLE, may need to practice cane on gravel-pt has packed gravel driveway and is nervous about this, static balance-EC (add to HEP?)   Debbora Dus, PT, DPT Debbora Dus, PT, DPT, CBIS   10/19/2021, 10:16 AM

## 2021-10-30 ENCOUNTER — Encounter: Payer: Self-pay | Admitting: Physical Therapy

## 2021-10-30 ENCOUNTER — Ambulatory Visit: Payer: Medicare Other | Admitting: Physical Therapy

## 2021-10-30 DIAGNOSIS — R2681 Unsteadiness on feet: Secondary | ICD-10-CM

## 2021-10-30 DIAGNOSIS — M6281 Muscle weakness (generalized): Secondary | ICD-10-CM

## 2021-10-30 DIAGNOSIS — R278 Other lack of coordination: Secondary | ICD-10-CM | POA: Diagnosis not present

## 2021-10-30 DIAGNOSIS — R262 Difficulty in walking, not elsewhere classified: Secondary | ICD-10-CM

## 2021-10-30 NOTE — Therapy (Signed)
OUTPATIENT PHYSICAL THERAPY NEURO TREATMENT/RE-CERT   Patient Name: SARYNA KNEELAND MRN: [id number] DOB:1945/07/31, 76 y.o., female Today's Date: 10/30/2021  PCP: Orpah Melter, MD REFERRING PROVIDER: Elodia Florence., MD     PT End of Session - 10/30/21 1535     Visit Number 22    Number of Visits 27   re-cert   Date for PT Re-Evaluation 01/60/10   re-cert   Authorization Type [employer] medicare    Progress Note Due on Visit 30    PT Start Time 1533    PT Stop Time 1614    PT Time Calculation (min) 41 min    Equipment Utilized During Treatment Gait belt    Activity Tolerance Patient tolerated treatment well    Behavior During Therapy WFL for tasks assessed/performed             Past Medical History:  Diagnosis Date   Allergic rhinitis    Asthma    seasonal    Bursitis    r hip    GERD (gastroesophageal reflux disease)    High cholesterol    Hypercholesteremia    Hypertension    Hypothyroidism    Hypothyroidism    Insomnia    Leukocytosis    Obesity    Peripheral vascular disease ([place])    bilateral LE edema ; edma in hands as well    PONV (postoperative nausea and vomiting)    Thrombocytosis    Vitamin D deficiency    Past Surgical History:  Procedure Laterality Date   bladder      bladder tack  20 years ago    COLONOSCOPY     EYE SURGERY Bilateral    catracts   hysterectomy      20 years    LUMBAR FUSION     OPEN SURGICAL REPAIR OF GLUTEAL TENDON Right 09/19/2016   Procedure: Right hip bursectomy and gluteal tendon repair;  Surgeon: Gaynelle Arabian, MD;  Location: [facility];  Service: Orthopedics;  Laterality: Right;   TONSILLECTOMY     age 80    TOTAL ANKLE ARTHROPLASTY Right 04/27/2021   Procedure: TOTAL ANKLE ARTHROPLASTY;  Surgeon: Wylene Simmer, MD;  Location: [place];  Service: Orthopedics;  Laterality: Right;   Patient Active Problem List   Diagnosis Date Noted   Syncope 08/17/2021   Leukocytosis 08/17/2021   History of  CVA (cerebrovascular accident) 08/17/2021   Hormone replacement therapy 07/25/2021   Essential hypertension 07/25/2021   Dyslipidemia 07/25/2021   Asthma, chronic 07/25/2021   Acute CVA (cerebrovascular accident) ([place]) 07/24/2021   Spondylolisthesis of lumbar region 09/10/2019   Tendinopathy of right gluteus medius 09/19/2016    ONSET DATE: 07/23/21  REFERRING DIAG: 63.9 (ICD-10-CM) - Acute CVA (cerebrovascular accident) ([place])   THERAPY DIAG:  Muscle weakness (generalized)  Other lack of coordination  Unsteadiness on feet  Difficulty in walking, not elsewhere classified  Rationale for Evaluation and Treatment Rehabilitation  SUBJECTIVE:  SUBJECTIVE STATEMENT: Pt states she is doing well, going to [facility] tomorrow and plans to walk on the sand w/ tennis shoes and cane.  She is doing about 2 flights of stairs a day and has made it [place]2 [place].  Pt accompanied by: significant other [name]  PERTINENT HISTORY: HTN,HLD,hypothyroidism,asthma PVD chronic b/l lower extremity edema, recent ankle replacement ~04/2021  PAIN:  Are you having pain? No PRECAUTIONS: Fall  WEIGHT BEARING RESTRICTIONS No   TODAY'S TREATMENT:  -Single leg bridge RLE 2x10, 1x10 on LLE w/ pt reporting left knee has been bothering her -pt has not been practicing this from HEP -Reviewed SLS at counter several repetitions from 20-30 sec on RLE, cued for glut med engagement, decreased propping of LLE on RLE, and dec UE support to 2 finger support - pt reports not performing at home -Attempted SLS on airex w/ light UE support, pt very anxious so activity d/c'd -Single leg squat w/ BUE support in // bars 2x6 > Y balance w/ BUE support 2x5, pt requires prolonged seated rest -STS w/ 10# slamball  push, pt unable to sustain push to far target so regressed to decreased weighted ball (4.4lb) w/ improved performance x12  PATIENT EDUCATION: Education details: Continue HEP. Person educated: Patient and Spouse Education method: Explanation, Demonstration, and Handouts Education comprehension: verbalized understanding   HOME EXERCISE PROGRAM: Access Code: [email or web address] URL: [email or web address] Date: 10/05/2021 Prepared by: Estevan Ryder  Exercises - Heel Raises with Counter Support  - 1 x daily - 7 x weekly - 3 sets - 10 reps - Clamshell with Resistance  - 1 x daily - 7 x weekly - 3 sets - 10 reps - Staggered Sit-to-Stand  - 1 x daily - 7 x weekly - 3 sets - 10 reps - Standing Single Leg Stance with Counter Support  - 1 x daily - 7 x weekly - 1 sets - 5 reps - 30 sec hold -forward rock and reach  - Supine Hamstring Curl on Swiss Ball  - 1 x daily - 7 x weekly - 3 sets - 10 reps  Standing Lunge at [place] of Stairs Stand at the bottom of the stairs holding onto your handrail. Lift your left foot up to the 2nd step and lean forwards until you feel a stretch in your R ankle. You can do this stretch about 5 times, holding for about 30 sec each.  GOALS: Goals reviewed with patient? Yes   NEW SHORT TERM GOALS: Target date: 12/01/21 Pt will be independent with updated HEP for improved balance    Baseline: to be updated prn   Goal status: NEW    2. Pt will improve FGA to >/= 21/30 to demonstrate improved balance and reduced fall risk   Baseline: 16/30   Goal status: NEW  NEW LONG TERM GOALS: Target date: 01/11/22  1. Pt will be independent with final HEP for improved balance Baseline: to be updated Goal status: NEW  2. Pt will improve FGA to >/= 26/30 to demonstrate improved balance and reduced fall risk  Baseline: 16/30  Goal status: NEW     ASSESSMENT:  CLINICAL IMPRESSION: Focus of skilled session on continuing to address right lower extremity strength and  balance w/ emphasis on weight acceptance through ankle.  Pt does well w/ single leg tasks today when using UE for support as she continues to lack confidence limiting initiation of task without UE support.  Will continue to address compliance to  HEP and progress towards LTGs as able in coming sessions.  OBJECTIVE IMPAIRMENTS Abnormal gait, decreased activity tolerance, decreased balance, decreased endurance, decreased knowledge of condition, decreased knowledge of use of DME, difficulty walking, and decreased strength.   ACTIVITY LIMITATIONS carrying, lifting, bending, standing, squatting, stairs, and locomotion level  PARTICIPATION LIMITATIONS: meal prep, cleaning, laundry, driving, shopping, and community activity  PERSONAL FACTORS Age, Fitness, Past/current experiences, and 3+ comorbidities: HTN HLD, PVD, LE edema, recent R ankle replacement   are also affecting patient's functional outcome.   REHAB POTENTIAL: Good  CLINICAL DECISION MAKING: Stable/uncomplicated  EVALUATION COMPLEXITY: Low  PLAN: PT FREQUENCY: 2x/week1x/week every other week   PT DURATION: 10 weeks 12 weeks (total of 6 more visits)   PLANNED INTERVENTIONS: Therapeutic exercises, Therapeutic activity, Neuromuscular re-education, Balance training, Gait training, Patient/Family education, Self Care, Joint mobilization, Joint manipulation, Stair training, Vestibular training, Visual/preceptual remediation/compensation, Orthotic/Fit training, DME instructions, Aquatic Therapy, Dry Needling, Cognitive remediation, Electrical stimulation, Spinal manipulation, Spinal mobilization, Cryotherapy, Moist heat, Manual lymph drainage, scar mobilization, Taping, Manual therapy, and Re-evaluation  PLAN FOR NEXT SESSION: continue to work on balance, gait mechanics, R ankle strengthening, eccentric step-downs, increasing stance time on RLE, R hip strengthening, continue stairs w/ quad rubber tip SPC no rail-descent, airex stair taps on RLE,  8" hurdles, SLS on airex RLE, may need to practice cane on gravel-pt has packed gravel driveway and is nervous about this, static balance-EC (add to HEP?), Stair taps w/ red theraband, slam ball on airex/foam beam   Bary Richard, PT, DPT   10/30/2021, 4:55 PM

## 2021-11-13 ENCOUNTER — Ambulatory Visit: Payer: Medicare Other

## 2021-11-13 DIAGNOSIS — R278 Other lack of coordination: Secondary | ICD-10-CM

## 2021-11-13 DIAGNOSIS — R2681 Unsteadiness on feet: Secondary | ICD-10-CM | POA: Diagnosis not present

## 2021-11-13 DIAGNOSIS — R262 Difficulty in walking, not elsewhere classified: Secondary | ICD-10-CM | POA: Diagnosis not present

## 2021-11-13 DIAGNOSIS — M6281 Muscle weakness (generalized): Secondary | ICD-10-CM | POA: Diagnosis not present

## 2021-11-13 NOTE — Therapy (Signed)
OUTPATIENT PHYSICAL THERAPY NEURO TREATMENT   Patient Name: Monica Hinton MRN: [id number] DOB:[birth year], 76 y.o., female Today's Date: 11/13/2021  PCP: Orpah Melter, MD REFERRING PROVIDER: Elodia Florence., MD     PT End of Session - 11/13/21 [phone]     Visit Number 23    Number of Visits 27    Date for PT Re-Evaluation 01/11/22    Authorization Type UHC medicare    Progress Note Due on Visit 30    PT Start Time 0930    PT Stop Time 1015    PT Time Calculation (min) 45 min    Equipment Utilized During Treatment Gait belt    Activity Tolerance Patient tolerated treatment well    Behavior During Therapy WFL for tasks assessed/performed             Past Medical History:  Diagnosis Date   Allergic rhinitis    Asthma    seasonal    Bursitis    r hip    GERD (gastroesophageal reflux disease)    High cholesterol    Hypercholesteremia    Hypertension    Hypothyroidism    Hypothyroidism    Insomnia    Leukocytosis    Obesity    Peripheral vascular disease ([place])    bilateral LE edema ; edma in hands as well    PONV (postoperative nausea and vomiting)    Thrombocytosis    Vitamin D deficiency    Past Surgical History:  Procedure Laterality Date   bladder      bladder tack  20 years ago    COLONOSCOPY     EYE SURGERY Bilateral    catracts   hysterectomy      20 years    LUMBAR FUSION     OPEN SURGICAL REPAIR OF GLUTEAL TENDON Right 09/19/2016   Procedure: Right hip bursectomy and gluteal tendon repair;  Surgeon: Gaynelle Arabian, MD;  Location: [facility];  Service: Orthopedics;  Laterality: Right;   TONSILLECTOMY     age 43    TOTAL ANKLE ARTHROPLASTY Right 04/27/2021   Procedure: TOTAL ANKLE ARTHROPLASTY;  Surgeon: Wylene Simmer, MD;  Location: [place];  Service: Orthopedics;  Laterality: Right;   Patient Active Problem List   Diagnosis Date Noted   Syncope 08/17/2021   Leukocytosis 08/17/2021   History of CVA (cerebrovascular  accident) 08/17/2021   Hormone replacement therapy 07/25/2021   Essential hypertension 07/25/2021   Dyslipidemia 07/25/2021   Asthma, chronic 07/25/2021   Acute CVA (cerebrovascular accident) ([place]) 07/24/2021   Spondylolisthesis of lumbar region 09/10/2019   Tendinopathy of right gluteus medius 09/19/2016    ONSET DATE: 07/23/21  REFERRING DIAG: 63.9 (ICD-10-CM) - Acute CVA (cerebrovascular accident) ([place])   THERAPY DIAG:  Muscle weakness (generalized)  Other lack of coordination  Unsteadiness on feet  Difficulty in walking, not elsewhere classified  Rationale for Evaluation and Treatment Rehabilitation  SUBJECTIVE:  SUBJECTIVE STATEMENT: Patient reports doing well. Walking a lot up to 2 miles a day throughout the day. Denies falls/near falls.   Pt accompanied by: significant other [name]  PERTINENT HISTORY: HTN,HLD,hypothyroidism,asthma PVD chronic b/l lower extremity edema, recent ankle replacement ~04/2021  PAIN:  Are you having pain? No PRECAUTIONS: Fall  WEIGHT BEARING RESTRICTIONS No   TODAY'S TREATMENT:  -treadmill x10 mins   -first 5 mins U UE gait  -2nd 5 mins 30s on/off exaggerated gait ball kicks -questions on TPA  -x4 laps with cups on toes to promote DF and equal step length   PATIENT EDUCATION: Education details: Continue HEP, tPA Person educated: Patient and Spouse Education method: Explanation, Media planner, and Handouts Education comprehension: verbalized understanding   HOME EXERCISE PROGRAM: Access Code: [email or web address] URL: [email or web address] Date: 10/05/2021 Prepared by: Estevan Ryder  Exercises - Heel Raises with Counter Support  - 1 x daily - 7 x weekly - 3 sets - 10 reps - Clamshell with Resistance  - 1 x daily - 7 x weekly - 3 sets - 10  reps - Staggered Sit-to-Stand  - 1 x daily - 7 x weekly - 3 sets - 10 reps - Standing Single Leg Stance with Counter Support  - 1 x daily - 7 x weekly - 1 sets - 5 reps - 30 sec hold -forward rock and reach  - Supine Hamstring Curl on Swiss Ball  - 1 x daily - 7 x weekly - 3 sets - 10 reps  Standing Lunge at [place] of Stairs Stand at the bottom of the stairs holding onto your handrail. Lift your left foot up to the 2nd step and lean forwards until you feel a stretch in your R ankle. You can do this stretch about 5 times, holding for about 30 sec each.  GOALS: Goals reviewed with patient? Yes   NEW SHORT TERM GOALS: Target date: 12/01/21 Pt will be independent with updated HEP for improved balance    Baseline: to be updated prn   Goal status: NEW    2. Pt will improve FGA to >/= 21/30 to demonstrate improved balance and reduced fall risk   Baseline: 16/30   Goal status: NEW  NEW LONG TERM GOALS: Target date: 01/11/22  1. Pt will be independent with final HEP for improved balance Baseline: to be updated Goal status: NEW  2. Pt will improve FGA to >/= 26/30 to demonstrate improved balance and reduced fall risk  Baseline: 16/30  Goal status: NEW     ASSESSMENT:  CLINICAL IMPRESSION: Patient seen for skilled PT session with emphasis on gait retraining. Discussed with patient anxiety component that may be contributing to gait dysfunction as it pertains to her R lateral hip stabilizers. Patient continues to have minimal carryover from gait retraining to when intervention is removed. Continue POC.   OBJECTIVE IMPAIRMENTS Abnormal gait, decreased activity tolerance, decreased balance, decreased endurance, decreased knowledge of condition, decreased knowledge of use of DME, difficulty walking, and decreased strength.   ACTIVITY LIMITATIONS carrying, lifting, bending, standing, squatting, stairs, and locomotion level  PARTICIPATION LIMITATIONS: meal prep, cleaning, laundry, driving,  shopping, and community activity  PERSONAL FACTORS Age, Fitness, Past/current experiences, and 3+ comorbidities: HTN HLD, PVD, LE edema, recent R ankle replacement   are also affecting patient's functional outcome.   REHAB POTENTIAL: Good  CLINICAL DECISION MAKING: Stable/uncomplicated  EVALUATION COMPLEXITY: Low  PLAN: PT FREQUENCY: 2x/week1x/week every other week   PT DURATION: 10 weeks 12 weeks (total of 6 more visits)  PLANNED INTERVENTIONS: Therapeutic exercises, Therapeutic activity, Neuromuscular re-education, Balance training, Gait training, Patient/Family education, Self Care, Joint mobilization, Joint manipulation, Stair training, Vestibular training, Visual/preceptual remediation/compensation, Orthotic/Fit training, DME instructions, Aquatic Therapy, Dry Needling, Cognitive remediation, Electrical stimulation, Spinal manipulation, Spinal mobilization, Cryotherapy, Moist heat, Manual lymph drainage, scar mobilization, Taping, Manual therapy, and Re-evaluation  PLAN FOR NEXT SESSION: continue to work on balance, gait mechanics, R ankle strengthening, eccentric step-downs, increasing stance time on RLE, R hip strengthening, continue stairs w/ quad rubber tip SPC no rail-descent, airex stair taps on RLE, 8" hurdles, SLS on airex RLE, may need to practice cane on gravel-pt has packed gravel driveway and is nervous about this, static balance-EC (add to HEP?), Stair taps w/ red theraband, slam ball on airex/foam beam   Debbora Dus, PT, DPT Debbora Dus, PT, DPT, CBIS    11/13/2021, 10:15 AM

## 2021-11-16 ENCOUNTER — Other Ambulatory Visit (HOSPITAL_BASED_OUTPATIENT_CLINIC_OR_DEPARTMENT_OTHER): Payer: Self-pay

## 2021-11-20 DIAGNOSIS — L218 Other seborrheic dermatitis: Secondary | ICD-10-CM | POA: Diagnosis not present

## 2021-11-20 DIAGNOSIS — L282 Other prurigo: Secondary | ICD-10-CM | POA: Diagnosis not present

## 2021-11-27 ENCOUNTER — Ambulatory Visit: Payer: Medicare Other | Attending: Family Medicine

## 2021-11-27 DIAGNOSIS — R2681 Unsteadiness on feet: Secondary | ICD-10-CM | POA: Insufficient documentation

## 2021-11-27 DIAGNOSIS — M6281 Muscle weakness (generalized): Secondary | ICD-10-CM | POA: Insufficient documentation

## 2021-11-27 DIAGNOSIS — R262 Difficulty in walking, not elsewhere classified: Secondary | ICD-10-CM | POA: Insufficient documentation

## 2021-11-27 DIAGNOSIS — R278 Other lack of coordination: Secondary | ICD-10-CM | POA: Diagnosis not present

## 2021-11-27 NOTE — Therapy (Signed)
OUTPATIENT PHYSICAL THERAPY NEURO TREATMENT   Patient Name: Monica Hinton MRN: [id number] DOB:November 19, 1945, 75 y.o., female Today's Date: 11/27/2021  PCP: Orpah Melter, MD REFERRING PROVIDER: Elodia Florence., MD     PT End of Session - 11/27/21 [phone]     Visit Number 24    Number of Visits 27    Date for PT Re-Evaluation 01/11/22    Authorization Type UHC medicare    Progress Note Due on Visit 30    PT Start Time 0930    PT Stop Time 1013    PT Time Calculation (min) 43 min    Equipment Utilized During Treatment Gait belt    Activity Tolerance Patient tolerated treatment well    Behavior During Therapy WFL for tasks assessed/performed             Past Medical History:  Diagnosis Date   Allergic rhinitis    Asthma    seasonal    Bursitis    r hip    GERD (gastroesophageal reflux disease)    High cholesterol    Hypercholesteremia    Hypertension    Hypothyroidism    Hypothyroidism    Insomnia    Leukocytosis    Obesity    Peripheral vascular disease ([place])    bilateral LE edema ; edma in hands as well    PONV (postoperative nausea and vomiting)    Thrombocytosis    Vitamin D deficiency    Past Surgical History:  Procedure Laterality Date   bladder      bladder tack  20 years ago    COLONOSCOPY     EYE SURGERY Bilateral    catracts   hysterectomy      20 years    LUMBAR FUSION     OPEN SURGICAL REPAIR OF GLUTEAL TENDON Right 09/19/2016   Procedure: Right hip bursectomy and gluteal tendon repair;  Surgeon: Gaynelle Arabian, MD;  [facility];  Service: Orthopedics;  Laterality: Right;   TONSILLECTOMY     age 67    TOTAL ANKLE ARTHROPLASTY Right 04/27/2021   Procedure: TOTAL ANKLE ARTHROPLASTY;  Surgeon: Wylene Simmer, MD;  Location: [place];  Service: Orthopedics;  Laterality: Right;   Patient Active Problem List   Diagnosis Date Noted   Syncope 08/17/2021   Leukocytosis 08/17/2021   History of CVA (cerebrovascular  accident) 08/17/2021   Hormone replacement therapy 07/25/2021   Essential hypertension 07/25/2021   Dyslipidemia 07/25/2021   Asthma, chronic 07/25/2021   Acute CVA (cerebrovascular accident) ([place]) 07/24/2021   Spondylolisthesis of lumbar region 09/10/2019   Tendinopathy of right gluteus medius 09/19/2016    ONSET DATE: 07/23/21  REFERRING DIAG: 63.9 (ICD-10-CM) - Acute CVA (cerebrovascular accident) ([place])   THERAPY DIAG:  Muscle weakness (generalized)  Other lack of coordination  Unsteadiness on feet  Difficulty in walking, not elsewhere classified  Rationale for Evaluation and Treatment Rehabilitation  SUBJECTIVE:  SUBJECTIVE STATEMENT: Patient reports doing well. Has been doing a lot of stairs. Feels walking and confidence has been improving.   Pt accompanied by: significant other [name]  PERTINENT HISTORY: HTN,HLD,hypothyroidism,asthma PVD chronic b/l lower extremity edema, recent ankle replacement ~04/2021  PAIN:  Are you having pain? No PRECAUTIONS: Fall  WEIGHT BEARING RESTRICTIONS No   TODAY'S TREATMENT:  -treadmill x10 mins   -1 min warm up  -x3 mins "normal" walking   -x3 mins ball kicks 1 min on, 1 min off  -x3 mins incline walking up to 5%  -sitting on heels -> tall kneeling progressed to resistance     PATIENT EDUCATION: Education details: Continue HEP Person educated: Patient and Spouse Education method: Explanation, Media planner, and Handouts Education comprehension: verbalized understanding   HOME EXERCISE PROGRAM: Access Code: [email or web address] URL: [email or web address] Date: 10/05/2021 Prepared by: Estevan Ryder  Exercises - Heel Raises with Counter Support  - 1 x daily - 7 x weekly - 3 sets - 10 reps - Clamshell with Resistance  - 1 x daily - 7 x  weekly - 3 sets - 10 reps - Staggered Sit-to-Stand  - 1 x daily - 7 x weekly - 3 sets - 10 reps - Standing Single Leg Stance with Counter Support  - 1 x daily - 7 x weekly - 1 sets - 5 reps - 30 sec hold -forward rock and reach  - Supine Hamstring Curl on Swiss Ball  - 1 x daily - 7 x weekly - 3 sets - 10 reps  Standing Lunge at [place] of Stairs Stand at the bottom of the stairs holding onto your handrail. Lift your left foot up to the 2nd step and lean forwards until you feel a stretch in your R ankle. You can do this stretch about 5 times, holding for about 30 sec each.  GOALS: Goals reviewed with patient? Yes   NEW SHORT TERM GOALS: Target date: 12/01/21 Pt will be independent with updated HEP for improved balance    Baseline: to be updated prn   Goal status: NEW    2. Pt will improve FGA to >/= 21/30 to demonstrate improved balance and reduced fall risk   Baseline: 16/30   Goal status: NEW  NEW LONG TERM GOALS: Target date: 01/11/22  1. Pt will be independent with final HEP for improved balance Baseline: to be updated Goal status: NEW  2. Pt will improve FGA to >/= 26/30 to demonstrate improved balance and reduced fall risk  Baseline: 16/30  Goal status: NEW     ASSESSMENT:  CLINICAL IMPRESSION: Patient seen for skilled PT session with emphasis on gait retraining and R hip NMR. Progressing well with gait- noted increase in trendelenburg with increasing fatigue or simple dual task. May benefit from trialing tall kneeling -> half kneeling transitioning. Continue POC.   OBJECTIVE IMPAIRMENTS Abnormal gait, decreased activity tolerance, decreased balance, decreased endurance, decreased knowledge of condition, decreased knowledge of use of DME, difficulty walking, and decreased strength.   ACTIVITY LIMITATIONS carrying, lifting, bending, standing, squatting, stairs, and locomotion level  PARTICIPATION LIMITATIONS: meal prep, cleaning, laundry, driving, shopping, and community  activity  PERSONAL FACTORS Age, Fitness, Past/current experiences, and 3+ comorbidities: HTN HLD, PVD, LE edema, recent R ankle replacement   are also affecting patient's functional outcome.   REHAB POTENTIAL: Good  CLINICAL DECISION MAKING: Stable/uncomplicated  EVALUATION COMPLEXITY: Low  PLAN: PT FREQUENCY: 2x/week1x/week every other week   PT DURATION: 10 weeks 12 weeks (total of 6 more visits)  PLANNED INTERVENTIONS: Therapeutic exercises, Therapeutic activity, Neuromuscular re-education, Balance training, Gait training, Patient/Family education, Self Care, Joint mobilization, Joint manipulation, Stair training, Vestibular training, Visual/preceptual remediation/compensation, Orthotic/Fit training, DME instructions, Aquatic Therapy, Dry Needling, Cognitive remediation, Electrical stimulation, Spinal manipulation, Spinal mobilization, Cryotherapy, Moist heat, Manual lymph drainage, scar mobilization, Taping, Manual therapy, and Re-evaluation  PLAN FOR NEXT SESSION: continue to work on balance, gait mechanics, R ankle strengthening, eccentric step-downs, increasing stance time on RLE, R hip strengthening, continue stairs w/ quad rubber tip SPC no rail-descent, airex stair taps on RLE, 8" hurdles, SLS on airex RLE, may need to practice cane on gravel-pt has packed gravel driveway and is nervous about this, static balance-EC (add to HEP?), Stair taps w/ red theraband, slam ball on airex/foam beam, tall kneeling-> half kneeling    Debbora Dus, PT, DPT Debbora Dus, PT, DPT, CBIS 11/27/2021, 10:15 AM

## 2021-12-11 ENCOUNTER — Ambulatory Visit: Payer: Medicare Other

## 2021-12-11 DIAGNOSIS — R278 Other lack of coordination: Secondary | ICD-10-CM

## 2021-12-11 DIAGNOSIS — R262 Difficulty in walking, not elsewhere classified: Secondary | ICD-10-CM

## 2021-12-11 DIAGNOSIS — R2681 Unsteadiness on feet: Secondary | ICD-10-CM | POA: Diagnosis not present

## 2021-12-11 DIAGNOSIS — M6281 Muscle weakness (generalized): Secondary | ICD-10-CM | POA: Diagnosis not present

## 2021-12-11 NOTE — Therapy (Signed)
OUTPATIENT PHYSICAL THERAPY NEURO TREATMENT   Patient Name: Monica Hinton MRN: [id number] DOB:04-20-1945, 76 y.o., female Today's Date: 12/11/2021  PCP: Orpah Melter, MD REFERRING PROVIDER: Elodia Florence., MD     PT End of Session - 12/11/21 [phone]     Visit Number 25    Number of Visits 27    Date for PT Re-Evaluation 01/11/22    Authorization Type [employer] medicare    Progress Note Due on Visit 13    PT Start Time 0931    PT Stop Time 1015    PT Time Calculation (min) 44 min    Equipment Utilized During Treatment Gait belt    Activity Tolerance Patient tolerated treatment well    Behavior During Therapy WFL for tasks assessed/performed             Past Medical History:  Diagnosis Date   Allergic rhinitis    Asthma    seasonal    Bursitis    r hip    GERD (gastroesophageal reflux disease)    High cholesterol    Hypercholesteremia    Hypertension    Hypothyroidism    Hypothyroidism    Insomnia    Leukocytosis    Obesity    Peripheral vascular disease ([place])    bilateral LE edema ; edma in hands as well    PONV (postoperative nausea and vomiting)    Thrombocytosis    Vitamin D deficiency    Past Surgical History:  Procedure Laterality Date   bladder      bladder tack  20 years ago    COLONOSCOPY     EYE SURGERY Bilateral    catracts   hysterectomy      20 years    LUMBAR FUSION     OPEN SURGICAL REPAIR OF GLUTEAL TENDON Right 09/19/2016   Procedure: Right hip bursectomy and gluteal tendon repair;  Surgeon: Gaynelle Arabian, MD;  Location: [facility];  Service: Orthopedics;  Laterality: Right;   TONSILLECTOMY     age 86    TOTAL ANKLE ARTHROPLASTY Right 04/27/2021   Procedure: TOTAL ANKLE ARTHROPLASTY;  Surgeon: Wylene Simmer, MD;  Location: [facility];  Service: Orthopedics;  Laterality: Right;   Patient Active Problem List   Diagnosis Date Noted   Syncope 08/17/2021   Leukocytosis 08/17/2021   History of CVA (cerebrovascular  accident) 08/17/2021   Hormone replacement therapy 07/25/2021   Essential hypertension 07/25/2021   Dyslipidemia 07/25/2021   Asthma, chronic 07/25/2021   Acute CVA (cerebrovascular accident) ([place]) 07/24/2021   Spondylolisthesis of lumbar region 09/10/2019   Tendinopathy of right gluteus medius 09/19/2016    ONSET DATE: 07/23/21  REFERRING DIAG: 63.9 (ICD-10-CM) - Acute CVA (cerebrovascular accident) ([place])   THERAPY DIAG:  Muscle weakness (generalized)  Other lack of coordination  Unsteadiness on feet  Difficulty in walking, not elsewhere classified  Rationale for Evaluation and Treatment Rehabilitation  SUBJECTIVE:  SUBJECTIVE STATEMENT: Patient reports doing well- denies falls/ near falls. Has been walking without a cane at times without issue.   Pt accompanied by: significant other [name]  PERTINENT HISTORY: HTN,HLD,hypothyroidism,asthma PVD chronic b/l lower extremity edema, recent ankle replacement ~04/2021  PAIN:  Are you having pain? No PRECAUTIONS: Fall  WEIGHT BEARING RESTRICTIONS No   TODAY'S TREATMENT:   [facility] PT Assessment - 12/11/21 0001       Functional Gait  Assessment   Gait assessed  Yes    Gait Level Surface Walks 20 ft in less than 7 sec but greater than 5.5 sec, uses assistive device, slower speed, mild gait deviations, or deviates 6-10 in outside of the 12 in walkway width.    Change in Gait Speed Able to smoothly change walking speed without loss of balance or gait deviation. Deviate no more than 6 in outside of the 12 in walkway width.    Gait with Horizontal Head Turns Performs head turns smoothly with no change in gait. Deviates no more than 6 in outside 12 in walkway width    Gait with Vertical Head Turns Performs head turns with no change in gait. Deviates no  more than 6 in outside 12 in walkway width.    Gait and Pivot Turn Pivot turns safely in greater than 3 sec and stops with no loss of balance, or pivot turns safely within 3 sec and stops with mild imbalance, requires small steps to catch balance.    Step Over Obstacle Is able to step over one shoe box (4.5 in total height) but must slow down and adjust steps to clear box safely. May require verbal cueing.    Gait with Narrow Base of Support Ambulates 4-7 steps.    Gait with Eyes Closed Walks 20 ft, uses assistive device, slower speed, mild gait deviations, deviates 6-10 in outside 12 in walkway width. Ambulates 20 ft in less than 9 sec but greater than 7 sec.    Ambulating Backwards Walks 20 ft, uses assistive device, slower speed, mild gait deviations, deviates 6-10 in outside 12 in walkway width.    Steps Alternating feet, must use rail.    Total Score 21           -Tandem gait at counter, U UE support  -> backward tandem U UE support  -lateral stepping in // bars on foam balance beam x2 laps  -scifit hills level 3 x10 mins B LE only     PATIENT EDUCATION: Education details: Continue HEP, exam findings Person educated: Patient and Spouse Education method: Explanation, Demonstration, and Handouts Education comprehension: verbalized understanding   HOME EXERCISE PROGRAM: Access Code: [email or web address] URL: [email or web address] Date: 10/05/2021 Prepared by: Estevan Ryder  Exercises - Heel Raises with Counter Support  - 1 x daily - 7 x weekly - 3 sets - 10 reps - Clamshell with Resistance  - 1 x daily - 7 x weekly - 3 sets - 10 reps - Staggered Sit-to-Stand  - 1 x daily - 7 x weekly - 3 sets - 10 reps - Standing Single Leg Stance with Counter Support  - 1 x daily - 7 x weekly - 1 sets - 5 reps - 30 sec hold -forward rock and reach  - Supine Hamstring Curl on Swiss Ball  - 1 x daily - 7 x weekly - 3 sets - 10 reps  Standing Lunge at [place] of Stairs Stand at the bottom  of the stairs holding onto your handrail. Lift your left  foot up to the 2nd step and lean forwards until you feel a stretch in your R ankle. You can do this stretch about 5 times, holding for about 30 sec each.  GOALS: Goals reviewed with patient? Yes   NEW SHORT TERM GOALS: Target date: 12/01/21 Pt will be independent with updated HEP for improved balance    Baseline: to be updated prn; provided   Goal status: MET    2. Pt will improve FGA to >/= 21/30 to demonstrate improved balance and reduced fall risk   Baseline: 16/30; 21/30   Goal status: MET  NEW LONG TERM GOALS: Target date: 01/11/22  1. Pt will be independent with final HEP for improved balance Baseline: to be updated Goal status: NEW  2. Pt will improve FGA to >/= 26/30 to demonstrate improved balance and reduced fall risk  Baseline: 16/30  Goal status: NEW     ASSESSMENT:  CLINICAL IMPRESSION: Patient seen for skilled PT session with emphasis on goal assessment. Patient demonstrates increased fall risk as noted by score of 21/30 on  Functional Gait Assessment.   <22/30 = predictive of falls, <20/30 = fall in 6 months, <18/30 = predictive of falls in PD MCID: 5 points stroke population, 4 points geriatric population (ANPTA Core Set of Outcome Measures for Adults with Neurologic Conditions, 2018). Patient declining to complete tall kneeling-> half kneeling tasks due to knee pain. Patient improving with engagement of R lateral hip stabilizers. Continue POC.     OBJECTIVE IMPAIRMENTS Abnormal gait, decreased activity tolerance, decreased balance, decreased endurance, decreased knowledge of condition, decreased knowledge of use of DME, difficulty walking, and decreased strength.   ACTIVITY LIMITATIONS carrying, lifting, bending, standing, squatting, stairs, and locomotion level  PARTICIPATION LIMITATIONS: meal prep, cleaning, laundry, driving, shopping, and community activity  PERSONAL FACTORS Age, Fitness, Past/current  experiences, and 3+ comorbidities: HTN HLD, PVD, LE edema, recent R ankle replacement   are also affecting patient's functional outcome.   REHAB POTENTIAL: Good  CLINICAL DECISION MAKING: Stable/uncomplicated  EVALUATION COMPLEXITY: Low  PLAN: PT FREQUENCY: 2x/week1x/week every other week   PT DURATION: 10 weeks 12 weeks (total of 6 more visits)   PLANNED INTERVENTIONS: Therapeutic exercises, Therapeutic activity, Neuromuscular re-education, Balance training, Gait training, Patient/Family education, Self Care, Joint mobilization, Joint manipulation, Stair training, Vestibular training, Visual/preceptual remediation/compensation, Orthotic/Fit training, DME instructions, Aquatic Therapy, Dry Needling, Cognitive remediation, Electrical stimulation, Spinal manipulation, Spinal mobilization, Cryotherapy, Moist heat, Manual lymph drainage, scar mobilization, Taping, Manual therapy, and Re-evaluation  PLAN FOR NEXT SESSION: continue to work on balance, gait mechanics, R ankle strengthening, eccentric step-downs, increasing stance time on RLE, R hip strengthening, continue stairs w/ quad rubber tip SPC no rail-descent, airex stair taps on RLE, 8" hurdles, SLS on airex RLE, may need to practice cane on gravel-pt has packed gravel driveway and is nervous about this, static balance-EC (add to HEP?), Stair taps w/ red theraband, slam ball on airex/foam beam, tandem gait   Debbora Dus, PT, DPT Debbora Dus, PT, DPT, CBIS 12/11/2021, 10:17 AM

## 2021-12-25 ENCOUNTER — Ambulatory Visit: Payer: Medicare Other | Attending: Family Medicine

## 2021-12-25 DIAGNOSIS — M6281 Muscle weakness (generalized): Secondary | ICD-10-CM | POA: Diagnosis not present

## 2021-12-25 DIAGNOSIS — R278 Other lack of coordination: Secondary | ICD-10-CM | POA: Insufficient documentation

## 2021-12-25 DIAGNOSIS — R262 Difficulty in walking, not elsewhere classified: Secondary | ICD-10-CM | POA: Insufficient documentation

## 2021-12-25 DIAGNOSIS — R2681 Unsteadiness on feet: Secondary | ICD-10-CM | POA: Diagnosis not present

## 2021-12-25 NOTE — Therapy (Signed)
OUTPATIENT PHYSICAL THERAPY NEURO TREATMENT   Patient Name: Monica Hinton MRN: [id number] DOB:October 05, 1945, 76 y.o., female Today's Date: 12/25/2021  PCP: Orpah Melter, MD REFERRING PROVIDER: Elodia Florence., MD     PT End of Session - 12/25/21 [phone]     Visit Number 26    Number of Visits 27    Date for PT Re-Evaluation 01/11/22    Authorization Type [employer] medicare    Progress Note Due on Visit 82    PT Start Time 0935    PT Stop Time 1018    PT Time Calculation (min) 43 min    Activity Tolerance Patient tolerated treatment well    Behavior During Therapy WFL for tasks assessed/performed             Past Medical History:  Diagnosis Date   Allergic rhinitis    Asthma    seasonal    Bursitis    r hip    GERD (gastroesophageal reflux disease)    High cholesterol    Hypercholesteremia    Hypertension    Hypothyroidism    Hypothyroidism    Insomnia    Leukocytosis    Obesity    Peripheral vascular disease ([place])    bilateral LE edema ; edma in hands as well    PONV (postoperative nausea and vomiting)    Thrombocytosis    Vitamin D deficiency    Past Surgical History:  Procedure Laterality Date   bladder      bladder tack  20 years ago    COLONOSCOPY     EYE SURGERY Bilateral    catracts   hysterectomy      20 years    LUMBAR FUSION     OPEN SURGICAL REPAIR OF GLUTEAL TENDON Right 09/19/2016   Procedure: Right hip bursectomy and gluteal tendon repair;  Surgeon: Gaynelle Arabian, MD;  Location: [facility];  Service: Orthopedics;  Laterality: Right;   TONSILLECTOMY     age 85    TOTAL ANKLE ARTHROPLASTY Right 04/27/2021   Procedure: TOTAL ANKLE ARTHROPLASTY;  Surgeon: Wylene Simmer, MD;  Location: [facility];  Service: Orthopedics;  Laterality: Right;   Patient Active Problem List   Diagnosis Date Noted   Syncope 08/17/2021   Leukocytosis 08/17/2021   History of CVA (cerebrovascular accident) 08/17/2021   Hormone replacement therapy  07/25/2021   Essential hypertension 07/25/2021   Dyslipidemia 07/25/2021   Asthma, chronic 07/25/2021   Acute CVA (cerebrovascular accident) ([place]) 07/24/2021   Spondylolisthesis of lumbar region 09/10/2019   Tendinopathy of right gluteus medius 09/19/2016    ONSET DATE: 07/23/21  REFERRING DIAG: 63.9 (ICD-10-CM) - Acute CVA (cerebrovascular accident) ([place])   THERAPY DIAG:  Muscle weakness (generalized)  Other lack of coordination  Unsteadiness on feet  Difficulty in walking, not elsewhere classified  Rationale for Evaluation and Treatment Rehabilitation  SUBJECTIVE:  SUBJECTIVE STATEMENT: Patient reports doing well- denies falls/ near falls. Has been primarily using cane in the community still.   Pt accompanied by: significant other [name]  PERTINENT HISTORY: HTN,HLD,hypothyroidism,asthma PVD chronic b/l lower extremity edema, recent ankle replacement ~04/2021  PAIN:  Are you having pain? No PRECAUTIONS: Fall  WEIGHT BEARING RESTRICTIONS No   TODAY'S TREATMENT:  -treadmill x6 mins with DF external cuing to L foot to promote softened L step  -multiple laps trialing heel wedge in R to assist with relative leg length discrepancy  -tandem gait on foam balance beam with toe tap to dot x3 laps in // bars    PATIENT EDUCATION: Education details: Continue HEP, PT POC, use of heel wedge  Person educated: Patient and Spouse Education method: Explanation, Demonstration, and Handouts Education comprehension: verbalized understanding   HOME EXERCISE PROGRAM: Access Code: [email or web address] URL: [email or web address] Date: 10/05/2021 Prepared by: Estevan Ryder  Exercises - Heel Raises with Counter Support  - 1 x daily - 7 x weekly - 3 sets - 10 reps - Clamshell with Resistance  - 1 x  daily - 7 x weekly - 3 sets - 10 reps - Staggered Sit-to-Stand  - 1 x daily - 7 x weekly - 3 sets - 10 reps - Standing Single Leg Stance with Counter Support  - 1 x daily - 7 x weekly - 1 sets - 5 reps - 30 sec hold -forward rock and reach  - Supine Hamstring Curl on Swiss Ball  - 1 x daily - 7 x weekly - 3 sets - 10 reps  Standing Lunge at [place] of Stairs Stand at the bottom of the stairs holding onto your handrail. Lift your left foot up to the 2nd step and lean forwards until you feel a stretch in your R ankle. You can do this stretch about 5 times, holding for about 30 sec each.  GOALS: Goals reviewed with patient? Yes   NEW SHORT TERM GOALS: Target date: 12/01/21 Pt will be independent with updated HEP for improved balance    Baseline: to be updated prn; provided   Goal status: MET    2. Pt will improve FGA to >/= 21/30 to demonstrate improved balance and reduced fall risk   Baseline: 16/30; 21/30   Goal status: MET  NEW LONG TERM GOALS: Target date: 01/11/22  1. Pt will be independent with final HEP for improved balance Baseline: to be updated Goal status: NEW  2. Pt will improve FGA to >/= 26/30 to demonstrate improved balance and reduced fall risk  Baseline: 16/30  Goal status: NEW     ASSESSMENT:  CLINICAL IMPRESSION: Patient seen for skilled PT session with emphasis on gait training and R hemibody NMR. Patient continues to demonstrate R Trendelenburg gait pattern, though it is slowly subsiding. With simple dual task, this gait deviation does return. PT adding 3/8" heel wedge in R shoe with mild resolution of developed relative leg length discrepancy. Continue POC.     OBJECTIVE IMPAIRMENTS Abnormal gait, decreased activity tolerance, decreased balance, decreased endurance, decreased knowledge of condition, decreased knowledge of use of DME, difficulty walking, and decreased strength.   ACTIVITY LIMITATIONS carrying, lifting, bending, standing, squatting, stairs, and  locomotion level  PARTICIPATION LIMITATIONS: meal prep, cleaning, laundry, driving, shopping, and community activity  PERSONAL FACTORS Age, Fitness, Past/current experiences, and 3+ comorbidities: HTN HLD, PVD, LE edema, recent R ankle replacement   are also affecting patient's functional outcome.   REHAB POTENTIAL: Good  CLINICAL DECISION MAKING: Stable/uncomplicated  EVALUATION COMPLEXITY: Low  PLAN: PT FREQUENCY: 2x/week1x/week every other week   PT DURATION: 10 weeks 12 weeks (total of 6 more visits)   PLANNED INTERVENTIONS: Therapeutic exercises, Therapeutic activity, Neuromuscular re-education, Balance training, Gait training, Patient/Family education, Self Care, Joint mobilization, Joint manipulation, Stair training, Vestibular training, Visual/preceptual remediation/compensation, Orthotic/Fit training, DME instructions, Aquatic Therapy, Dry Needling, Cognitive remediation, Electrical stimulation, Spinal manipulation, Spinal mobilization, Cryotherapy, Moist heat, Manual lymph drainage, scar mobilization, Taping, Manual therapy, and Re-evaluation  PLAN FOR NEXT SESSION: goal assessment and dc   Debbora Dus, PT, DPT Debbora Dus, PT, DPT, CBIS 12/25/2021, 11:24 AM

## 2021-12-26 NOTE — Progress Notes (Unsigned)
[facility] [address] [place]. [place] [address] [phone]       STROKE FOLLOW UP NOTE  [name] Date of Birth:  18-Dec-1945 Medical Record Number:  [id number]    Reason for visit: stroke follow up    SUBJECTIVE:   CHIEF COMPLAINT:  No chief complaint on file.   HPI:   Update 12/27/2021 JM: Patient returns for 53-monthstroke follow-up.  She continues to work with PT for continued right-sided weakness with continued improvement since prior visit.  Ambulates with ***, denies any recent falls.  Denies any new stroke/TIA symptoms.  Remains on aspirin and Crestor Blood pressure well-controlled Routinely follows with PCP Dr. MOlen Pel  History provided for reference purposes only Initial visit 09/05/2021 JM: Patient is being seen for initial hospital follow-up.  She is accompanied by her husband.  Has been doing well since discharge.  Reports gradual improvement of right-sided weakness, feels RUE just about back to baseline and RLE mild weakness with heaviness sensation but greatly improving. She does note chronic right shoulder arthritis and total ankle replacement back in April.  Currently working with neuro rehab PT/OT, routinely does HEP and walks daily at local gym. Ambulates with rollator walker.  No recent falls.  Denies new stroke/TIA symptoms. She doe mention 6 weeks prior to her stroke, she experienced 2 ocular migraines, has not had any additional migraines since discharge.  Completed 3 weeks DAPT, remains on aspirin alone as well as Crestor, denies side effects Blood pressure today 146/81 -routine monitors at home and typically 124/78  No further concerns at this time  Stroke admission 07/23/2021 [name] a 76y.o. female with history of  hypertension, hyperlipidemia, hypothyroidism and PVD and GERD who presented on 07/23/2021 with right-sided weakness upon awakening.  Last known well would be 10 PM the night before.  Personally  reviewed hospitalization pertinent progress notes, lab work and imaging.  Evaluated by Dr. 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  CTA head/neck unremarkable.  EF 55 to 60%.  LDL 43.  A1c 5.4.  Recommended DAPT for 3 weeks and aspirin alone as well as continuation of Crestor 10 mg daily.  No prior stroke history.  Therapy evaluations recommended outpatient PT/OT.      PERTINENT IMAGING  Per hospitalization 07/23/2021 CTA head & neck Normal CTA of the head and neck. MRI  1. 12 mm acute ischemic nonhemorrhagic posterior left basal ganglia infarct. 2. Underlying mild to moderate chronic microvascular ischemic disease. 2D Echo EF 55 to 60% LDL 43 HgbA1c 5.4    ROS:   14 system review of systems performed and negative with exception of those listed in HPI  PMH:  Past Medical History:  Diagnosis Date   Allergic rhinitis    Asthma    seasonal    Bursitis    r hip    GERD (gastroesophageal reflux disease)    High cholesterol    Hypercholesteremia    Hypertension    Hypothyroidism    Hypothyroidism    Insomnia    Leukocytosis    Obesity    Peripheral vascular disease (HCC)    bilateral LE edema ; edma in hands as well    PONV (postoperative nausea and vomiting)    Thrombocytosis    Vitamin D deficiency     PSH:  Past Surgical History:  Procedure Laterality Date   bladder      bladder tack  20 years ago    COLONOSCOPY     EYE SURGERY Bilateral    catracts   hysterectomy      20 years    LUMBAR FUSION     OPEN SURGICAL REPAIR OF GLUTEAL TENDON Right 09/19/2016   Procedure: Right hip bursectomy and gluteal tendon repair;  Surgeon: Gaynelle Arabian, MD;  Location: [facility];  Service: Orthopedics;  Laterality: Right;   TONSILLECTOMY     age 45    TOTAL ANKLE ARTHROPLASTY Right 04/27/2021   Procedure: TOTAL ANKLE ARTHROPLASTY;  Surgeon: Wylene Simmer, MD;  Location: [facility];  Service: Orthopedics;   Laterality: Right;    Social History:  Social History   Socioeconomic History   Marital status: Married    Spouse name: Not on file   Number of children: Not on file   Years of education: Not on file   Highest education level: Not on file  Occupational History   Not on file  Tobacco Use   Smoking status: Former    Packs/day: 0.30    Years: 10.00    Total pack years: 3.00    Types: Cigarettes   Smokeless tobacco: Never   Tobacco comments:    25 years ago quit   Vaping Use   Vaping Use: Never used  Substance and Sexual Activity   Alcohol use: Yes    Comment: occasionally wine    Drug use: No   Sexual activity: Not Currently  Other Topics Concern   Not on file  Social History Narrative   Not on file   Social Determinants of Health   Financial Resource Strain: Not on file  Food Insecurity: Not on file  Transportation Needs: Not on file  Physical Activity: Not on file  Stress: Not on file  Social Connections: Not on file  Intimate Partner Violence: Not on file    Family History:  Family History  Problem Relation Age of Onset   Colon cancer Mother        12s    Medications:   Current Outpatient Medications on File Prior to Visit  Medication Sig Dispense Refill   acetaminophen (TYLENOL) 500 MG tablet Take 1,000 mg by mouth 3 (three) times daily.     aspirin 81 MG chewable tablet Chew 1 tablet (81 mg total) by mouth daily. 30 tablet 11   gabapentin (NEURONTIN) 300 MG capsule Take 600 mg by mouth 3 (three) times daily.     glycerin adult 2 g suppository Place 1 suppository rectally as needed for constipation. 12 suppository 0   levothyroxine (SYNTHROID) 112 MCG tablet Take 112 mcg by mouth daily before breakfast.     losartan (COZAAR) 100 MG tablet Take 100 mg by mouth daily.     montelukast (SINGULAIR) 10 MG tablet Take 1 tablet by mouth daily.     RABEprazole (ACIPHEX) 20 MG tablet Take 20 mg by mouth daily before breakfast.      rosuvastatin (CRESTOR) 10 MG  tablet Take 10 mg by mouth daily.      triamcinolone (NASACORT ALLERGY 24HR) 55 MCG/ACT AERO nasal inhaler Place 2 sprays into the nose daily.     Vitamin D, Ergocalciferol, (DRISDOL) 1.25 MG (50000 UNIT) CAPS capsule Take 50,000 Units by mouth every Monday. Patient takes on Wednesday     No current facility-administered medications on file prior to visit.    Allergies:   Allergies  Allergen Reactions   Yellow Jacket Venom [Bee Venom] Anaphylaxis    Pt has  EPI PEN   Codeine Nausea And Vomiting and Other (See Comments)   Milk (Cow)    Other     Lactose Intolerant     Penicillins Hives and Other (See Comments)    Many years - dr does not put pt on this   Procaine Other (See Comments)    Other reaction(s): fever,chills   Simvastatin Other (See Comments)    Other reaction(s): rash   Chlorhexidine Rash    With preop washes - patient states fine to use Chloraprep      OBJECTIVE:  Physical Exam  There were no vitals filed for this visit.  There is no height or weight on file to calculate BMI. No results found.  General: well developed, well nourished, very pleasant elderly Caucasian female, seated, in no evident distress Head: head normocephalic and atraumatic.   Neck: supple with no carotid or supraclavicular bruits Cardiovascular: regular rate and rhythm, no murmurs Skin:  no rash/petichiae Vascular:  Normal pulses all extremities   Neurologic Exam Mental Status: Awake and fully alert.  Fluent speech and language.  Oriented to place and time. Recent and remote memory intact. Attention span, concentration and fund of knowledge appropriate. Mood and affect appropriate.  Cranial Nerves: Pupils equal, briskly reactive to light. Extraocular movements full without nystagmus. Visual fields full to confrontation. Hearing intact. Facial sensation intact. Face, tongue, palate moves normally and symmetrically.  Motor: Normal bulk and tone. Normal strength in all tested extremity  muscles except mild decreased right hand dexterity and deltoid weakness and mild R HF and ADF weakness Sensory.: intact to touch , pinprick , position and vibratory sensation.  Coordination: Rapid alternating movements normal in all extremities except slightly decreased right hand. Finger-to-nose and heel-to-shin performed accurately bilaterally. Gait and Station: Arises from chair without difficulty. Stance is normal. Gait demonstrates normal stride length and balance with use of RW. Tandem walk and heel toe attempted.  Reflexes: 1+ and symmetric. Toes downgoing.         ASSESSMENT: Monica Hinton is a 76 y.o. year old female with posterior left BG ischemic stroke on 07/23/2021 likely secondary to small vessel disease. Vascular risk factors include HTN, HLD, advanced age and ocular migraines.      PLAN:  Left BG stroke:  Residual deficit: Mild right-sided weakness. Making gradual recovery.  Continue therapies. Continue aspirin 81 mg daily  and Crestor 10 mg daily for secondary stroke prevention.   Discussed secondary stroke prevention measures and importance of close PCP follow up for aggressive stroke risk factor management including BP goal<130/90, HLD with LDL goal<70 and DM with A1c.<7 .  Stroke labs 07/2021: LDL 43, A1c 5.4 I have gone over the pathophysiology of stroke, warning signs and symptoms, risk factors and their management in some detail with instructions to go to the closest emergency room for symptoms of concern.    Follow up in 4 months or call earlier if needed   CC:  PCP: Orpah Melter, MD    I spent 59 minutes of face-to-face and non-face-to-face time with patient and husband.  This included previsit chart review, lab review, study review, electronic health record documentation, patient education and discussion regarding above diagnoses and treatment plan and answered all the questions to patient satisfaction   Frann Rider, [facility]  [facility] [address] [place], [place] [address]  Phone [phone] Fax [phone] Note: This document was prepared with digital dictation and possible smart phrase technology. Any transcriptional errors that result  from this process are unintentional.

## 2021-12-27 ENCOUNTER — Ambulatory Visit: Payer: Medicare Other | Admitting: Adult Health

## 2021-12-27 ENCOUNTER — Encounter: Payer: Self-pay | Admitting: Adult Health

## 2021-12-27 VITALS — BP 132/77 | HR 72 | Ht 63.0 in | Wt 181.8 lb

## 2021-12-27 DIAGNOSIS — I6381 Other cerebral infarction due to occlusion or stenosis of small artery: Secondary | ICD-10-CM

## 2021-12-27 DIAGNOSIS — I69398 Other sequelae of cerebral infarction: Secondary | ICD-10-CM | POA: Diagnosis not present

## 2021-12-27 DIAGNOSIS — R269 Unspecified abnormalities of gait and mobility: Secondary | ICD-10-CM

## 2021-12-27 DIAGNOSIS — G8191 Hemiplegia, unspecified affecting right dominant side: Secondary | ICD-10-CM

## 2021-12-27 NOTE — Patient Instructions (Addendum)
Continue working with therapies for hopeful ongoing recovery  Continue aspirin 81 mg daily  and Crestor for secondary stroke prevention  Continue to follow up with PCP regarding blood pressure and cholesterol management  Maintain strict control of hypertension with blood pressure goal below 130/90 and cholesterol with LDL cholesterol (bad cholesterol) goal below 70 mg/dL.   Signs of a Stroke? Follow the BEFAST method:  Balance Watch for a sudden loss of balance, trouble with coordination or vertigo Eyes Is there a sudden loss of vision in one or both eyes? Or double vision?  Face: Ask the person to smile. Does one side of the face droop or is it numb?  Arms: Ask the person to raise both arms. Does one arm drift downward? Is there weakness or numbness of a leg? Speech: Ask the person to repeat a simple phrase. Does the speech sound slurred/strange? Is the person confused ? Time: If you observe any of these signs, call 911.       Thank you for coming to see [place] at [facility]. I hope we have been able to provide you high quality care today.  You may receive a patient satisfaction survey over the next few weeks. We would appreciate your feedback and comments so that we may continue to improve ourselves and the health of our patients.

## 2022-01-01 DIAGNOSIS — I6930 Unspecified sequelae of cerebral infarction: Secondary | ICD-10-CM | POA: Diagnosis not present

## 2022-01-01 DIAGNOSIS — Z0000 Encounter for general adult medical examination without abnormal findings: Secondary | ICD-10-CM | POA: Diagnosis not present

## 2022-01-01 DIAGNOSIS — J309 Allergic rhinitis, unspecified: Secondary | ICD-10-CM | POA: Diagnosis not present

## 2022-01-01 DIAGNOSIS — E7800 Pure hypercholesterolemia, unspecified: Secondary | ICD-10-CM | POA: Diagnosis not present

## 2022-01-01 DIAGNOSIS — I10 Essential (primary) hypertension: Secondary | ICD-10-CM | POA: Diagnosis not present

## 2022-01-01 DIAGNOSIS — E039 Hypothyroidism, unspecified: Secondary | ICD-10-CM | POA: Diagnosis not present

## 2022-01-01 DIAGNOSIS — R7303 Prediabetes: Secondary | ICD-10-CM | POA: Diagnosis not present

## 2022-01-01 DIAGNOSIS — K219 Gastro-esophageal reflux disease without esophagitis: Secondary | ICD-10-CM | POA: Diagnosis not present

## 2022-01-12 ENCOUNTER — Ambulatory Visit: Payer: Medicare Other

## 2022-01-12 DIAGNOSIS — R2681 Unsteadiness on feet: Secondary | ICD-10-CM

## 2022-01-12 DIAGNOSIS — R278 Other lack of coordination: Secondary | ICD-10-CM | POA: Diagnosis not present

## 2022-01-12 DIAGNOSIS — R262 Difficulty in walking, not elsewhere classified: Secondary | ICD-10-CM

## 2022-01-12 DIAGNOSIS — M6281 Muscle weakness (generalized): Secondary | ICD-10-CM

## 2022-01-12 NOTE — Therapy (Signed)
OUTPATIENT PHYSICAL THERAPY NEURO TREATMENT/ RE-CERT/ DISCHARGE SUMMARY    Patient Name: Monica Hinton MRN: [id number] DOB:[birth year], 76 y.o., female Today's Date: 01/12/2022  PCP: Orpah Melter, MD REFERRING PROVIDER: Elodia Florence., MD   PHYSICAL THERAPY DISCHARGE SUMMARY  Visits from Start of Care: 27  Current functional level related to goals / functional outcomes: See below   Remaining deficits: See below   Education / Equipment: PT POC, HEP, rehab prognosis    Patient agrees to discharge. Patient goals were partially met. Patient is being discharged due to meeting the stated rehab goals.   PT End of Session - 01/12/22 1001     Visit Number 27    Number of Visits 27    Date for PT Re-Evaluation 01/11/22    Authorization Type [employer] medicare    Progress Note Due on Visit 30    PT Start Time 1011    PT Stop Time 1040   dc   PT Time Calculation (min) 29 min    Activity Tolerance Patient tolerated treatment well    Behavior During Therapy WFL for tasks assessed/performed             Past Medical History:  Diagnosis Date   Allergic rhinitis    Asthma    seasonal    Bursitis    r hip    GERD (gastroesophageal reflux disease)    High cholesterol    Hypercholesteremia    Hypertension    Hypothyroidism    Hypothyroidism    Insomnia    Leukocytosis    Obesity    Peripheral vascular disease (HCC)    bilateral LE edema ; edma in hands as well    PONV (postoperative nausea and vomiting)    Thrombocytosis    Vitamin D deficiency    Past Surgical History:  Procedure Laterality Date   bladder      bladder tack  20 years ago    COLONOSCOPY     EYE SURGERY Bilateral    catracts   hysterectomy      20 years    LUMBAR FUSION     OPEN SURGICAL REPAIR OF GLUTEAL TENDON Right 09/19/2016   Procedure: Right hip bursectomy and gluteal tendon repair;  Surgeon: Gaynelle Arabian, MD;  Location: [facility];  Service: Orthopedics;  Laterality: Right;    TONSILLECTOMY     age 76    TOTAL ANKLE ARTHROPLASTY Right 04/27/2021   Procedure: TOTAL ANKLE ARTHROPLASTY;  Surgeon: Wylene Simmer, MD;  Location: [facility];  Service: Orthopedics;  Laterality: Right;   Patient Active Problem List   Diagnosis Date Noted   Syncope 08/17/2021   Leukocytosis 08/17/2021   History of CVA (cerebrovascular accident) 08/17/2021   Hormone replacement therapy 07/25/2021   Essential hypertension 07/25/2021   Dyslipidemia 07/25/2021   Asthma, chronic 07/25/2021   Acute CVA (cerebrovascular accident) ([place]) 07/24/2021   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 No falls/near falls. "Took back" her kitchen, but reports she feels as though her endurance is still lacking. Has been going without the cane for ~80% of the time within the home.   Pt accompanied by: significant other [name]  PERTINENT HISTORY: HTN,HLD,hypothyroidism,asthma PVD chronic b/l lower extremity edema, recent ankle replacement ~04/2021  PAIN:  Are you having pain? No  PRECAUTIONS: Fall  WEIGHT BEARING RESTRICTIONS No   TODAY'S TREATMENT:  -treadmill x8 mins for improved endurance   [facility] PT Assessment - 01/12/22 0001       Functional Gait  Assessment   Gait assessed  Yes    Gait Level Surface Walks 20 ft in less than 7 sec but greater than 5.5 sec, uses assistive  device, slower speed, mild gait deviations, or deviates 6-10 in outside of the 12 in walkway width.    Change in Gait Speed Able to smoothly change walking speed without loss of balance or gait deviation. Deviate no more than 6 in outside of the 12 in walkway width.    Gait with Horizontal Head Turns Performs head turns smoothly with no change in gait. Deviates no more than 6 in outside 12 in walkway width    Gait with Vertical Head Turns Performs head turns with no change in gait. Deviates no more than 6 in outside 12 in walkway width.    Gait and Pivot Turn Pivot turns safely within 3 sec and stops quickly with no loss of balance.    Step Over Obstacle Is able to step over one shoe box (4.5 in total height) without changing gait speed. No evidence of imbalance.    Gait with Narrow Base of Support Ambulates 4-7 steps.    Gait with Eyes Closed Walks 20 ft, no assistive devices, good speed, no evidence of imbalance, normal gait pattern, deviates no more than 6 in outside 12 in walkway width. Ambulates 20 ft in less than 7 sec.    Ambulating Backwards Walks 20 ft, uses assistive device, slower speed, mild gait deviations, deviates 6-10 in outside 12 in walkway width.    Steps Alternating feet, must use rail.    Total Score 24             PATIENT EDUCATION: Education details: Continue HEP, PT POC, exam findings Person educated: Patient and Spouse Education method: Explanation, Demonstration, and Handouts Education comprehension: verbalized understanding   HOME EXERCISE PROGRAM: Access Code: [email or web address] URL: [email or web address] Date: 10/05/2021 Prepared by: Estevan Ryder  Exercises - Heel Raises with Counter Support  - 1 x daily - 7 x weekly - 3 sets - 10 reps - Clamshell with Resistance  - 1 x daily - 7 x weekly - 3 sets - 10 reps - Staggered Sit-to-Stand  - 1 x daily - 7 x weekly - 3 sets - 10 reps - Standing Single Leg Stance with Counter Support  - 1 x daily - 7 x  weekly - 1 sets - 5 reps - 30 sec hold -forward rock and reach  - Supine Hamstring Curl on Swiss Ball  - 1 x daily - 7 x weekly - 3 sets - 10 reps  Standing Lunge at [place] of Stairs Stand at the bottom of the stairs holding onto your handrail. Lift your left foot up to the 2nd step and lean forwards until you feel a stretch in your R ankle. You can do this stretch about 5 times, holding for about 30 sec each.  GOALS:  Goals reviewed with patient? Yes   NEW SHORT TERM GOALS: Target date: 12/01/21 Pt will be independent with updated HEP for improved balance    Baseline: to be updated prn; provided   Goal status: MET    2. Pt will improve FGA to >/= 21/30 to demonstrate improved balance and reduced fall risk   Baseline: 16/30; 21/30   Goal status: MET  NEW LONG TERM GOALS: Target date: 01/11/22  1. Pt will be independent with final HEP for improved balance Baseline: to be updated; updated Goal status: MET  2. Pt will improve FGA to >/= 26/30 to demonstrate improved balance and reduced fall risk  Baseline: 16/30; 24/30  Goal status: PARTIALLY MET     ASSESSMENT:  CLINICAL IMPRESSION: Patient seen for skilled PT session with emphasis on goal assessment and dc. She met 1/2 LTG and made excellent progress toward remaining goal. Patient demonstrates increased fall risk as noted by score of 24/30 on  Functional Gait Assessment.   <22/30 = predictive of falls, <20/30 = fall in 6 months, <18/30 = predictive of falls in PD MCID: 5 points stroke population, 4 points geriatric population (ANPTA Core Set of Outcome Measures for Adults with Neurologic Conditions, 2018). Patient to dc from PT at this time.      OBJECTIVE IMPAIRMENTS Abnormal gait, decreased activity tolerance, decreased balance, decreased endurance, decreased knowledge of condition, decreased knowledge of use of DME, difficulty walking, and decreased strength.   ACTIVITY LIMITATIONS carrying, lifting, bending, standing,  squatting, stairs, and locomotion level  PARTICIPATION LIMITATIONS: meal prep, cleaning, laundry, driving, shopping, and community activity  PERSONAL FACTORS Age, Fitness, Past/current experiences, and 3+ comorbidities: HTN HLD, PVD, LE edema, recent R ankle replacement   are also affecting patient's functional outcome.   REHAB POTENTIAL: Good  CLINICAL DECISION MAKING: Stable/uncomplicated  EVALUATION COMPLEXITY: Low  PLAN: PT FREQUENCY: 2x/week1x/week every other week   PT DURATION: 10 weeks 12 weeks (total of 6 more visits)   PLANNED INTERVENTIONS: Therapeutic exercises, Therapeutic activity, Neuromuscular re-education, Balance training, Gait training, Patient/Family education, Self Care, Joint mobilization, Joint manipulation, Stair training, Vestibular training, Visual/preceptual remediation/compensation, Orthotic/Fit training, DME instructions, Aquatic Therapy, Dry Needling, Cognitive remediation, Electrical stimulation, Spinal manipulation, Spinal mobilization, Cryotherapy, Moist heat, Manual lymph drainage, scar mobilization, Taping, Manual therapy, and Re-evaluation  PLAN FOR NEXT SESSION: dc from PT  Debbora Dus, PT, DPT Debbora Dus, PT, DPT, CBIS 01/12/2022, 10:53 AM

## 2022-02-01 DIAGNOSIS — M6281 Muscle weakness (generalized): Secondary | ICD-10-CM | POA: Diagnosis not present

## 2022-02-01 DIAGNOSIS — R2689 Other abnormalities of gait and mobility: Secondary | ICD-10-CM | POA: Diagnosis not present

## 2022-02-05 DIAGNOSIS — M6281 Muscle weakness (generalized): Secondary | ICD-10-CM | POA: Diagnosis not present

## 2022-02-13 DIAGNOSIS — M6281 Muscle weakness (generalized): Secondary | ICD-10-CM | POA: Diagnosis not present

## 2022-02-14 DIAGNOSIS — Z1231 Encounter for screening mammogram for malignant neoplasm of breast: Secondary | ICD-10-CM | POA: Diagnosis not present

## 2022-02-15 DIAGNOSIS — M6281 Muscle weakness (generalized): Secondary | ICD-10-CM | POA: Diagnosis not present

## 2022-02-21 DIAGNOSIS — M6281 Muscle weakness (generalized): Secondary | ICD-10-CM | POA: Diagnosis not present

## 2022-02-23 DIAGNOSIS — M6281 Muscle weakness (generalized): Secondary | ICD-10-CM | POA: Diagnosis not present

## 2022-02-27 DIAGNOSIS — M6281 Muscle weakness (generalized): Secondary | ICD-10-CM | POA: Diagnosis not present

## 2022-03-01 DIAGNOSIS — M6281 Muscle weakness (generalized): Secondary | ICD-10-CM | POA: Diagnosis not present

## 2022-03-05 DIAGNOSIS — M6281 Muscle weakness (generalized): Secondary | ICD-10-CM | POA: Diagnosis not present

## 2022-03-07 DIAGNOSIS — M6281 Muscle weakness (generalized): Secondary | ICD-10-CM | POA: Diagnosis not present

## 2022-03-12 DIAGNOSIS — M6281 Muscle weakness (generalized): Secondary | ICD-10-CM | POA: Diagnosis not present

## 2022-03-13 DIAGNOSIS — E7800 Pure hypercholesterolemia, unspecified: Secondary | ICD-10-CM | POA: Diagnosis not present

## 2022-03-13 DIAGNOSIS — Z79899 Other long term (current) drug therapy: Secondary | ICD-10-CM | POA: Diagnosis not present

## 2022-03-13 DIAGNOSIS — E538 Deficiency of other specified B group vitamins: Secondary | ICD-10-CM | POA: Diagnosis not present

## 2022-03-13 DIAGNOSIS — E559 Vitamin D deficiency, unspecified: Secondary | ICD-10-CM | POA: Diagnosis not present

## 2022-03-15 DIAGNOSIS — M6281 Muscle weakness (generalized): Secondary | ICD-10-CM | POA: Diagnosis not present

## 2022-03-19 DIAGNOSIS — M6281 Muscle weakness (generalized): Secondary | ICD-10-CM | POA: Diagnosis not present

## 2022-03-21 DIAGNOSIS — M6281 Muscle weakness (generalized): Secondary | ICD-10-CM | POA: Diagnosis not present

## 2022-03-27 DIAGNOSIS — M6281 Muscle weakness (generalized): Secondary | ICD-10-CM | POA: Diagnosis not present

## 2022-03-29 DIAGNOSIS — M6281 Muscle weakness (generalized): Secondary | ICD-10-CM | POA: Diagnosis not present

## 2022-04-25 DIAGNOSIS — M19071 Primary osteoarthritis, right ankle and foot: Secondary | ICD-10-CM | POA: Diagnosis not present

## 2022-05-06 DIAGNOSIS — R051 Acute cough: Secondary | ICD-10-CM | POA: Diagnosis not present

## 2022-05-22 DIAGNOSIS — Z79899 Other long term (current) drug therapy: Secondary | ICD-10-CM | POA: Diagnosis not present

## 2022-05-22 DIAGNOSIS — I10 Essential (primary) hypertension: Secondary | ICD-10-CM | POA: Diagnosis not present

## 2022-05-22 DIAGNOSIS — E7800 Pure hypercholesterolemia, unspecified: Secondary | ICD-10-CM | POA: Diagnosis not present

## 2022-05-22 DIAGNOSIS — I69354 Hemiplegia and hemiparesis following cerebral infarction affecting left non-dominant side: Secondary | ICD-10-CM | POA: Diagnosis not present

## 2022-05-22 DIAGNOSIS — Z9989 Dependence on other enabling machines and devices: Secondary | ICD-10-CM | POA: Diagnosis not present

## 2022-05-22 DIAGNOSIS — D649 Anemia, unspecified: Secondary | ICD-10-CM | POA: Diagnosis not present

## 2022-05-22 DIAGNOSIS — R7303 Prediabetes: Secondary | ICD-10-CM | POA: Diagnosis not present

## 2022-05-22 DIAGNOSIS — R609 Edema, unspecified: Secondary | ICD-10-CM | POA: Diagnosis not present

## 2022-05-22 DIAGNOSIS — E039 Hypothyroidism, unspecified: Secondary | ICD-10-CM | POA: Diagnosis not present

## 2022-05-29 DIAGNOSIS — D1801 Hemangioma of skin and subcutaneous tissue: Secondary | ICD-10-CM | POA: Diagnosis not present

## 2022-05-29 DIAGNOSIS — D225 Melanocytic nevi of trunk: Secondary | ICD-10-CM | POA: Diagnosis not present

## 2022-05-29 DIAGNOSIS — L218 Other seborrheic dermatitis: Secondary | ICD-10-CM | POA: Diagnosis not present

## 2022-05-29 DIAGNOSIS — L819 Disorder of pigmentation, unspecified: Secondary | ICD-10-CM | POA: Diagnosis not present

## 2022-05-29 DIAGNOSIS — L821 Other seborrheic keratosis: Secondary | ICD-10-CM | POA: Diagnosis not present

## 2022-06-13 DIAGNOSIS — M4316 Spondylolisthesis, lumbar region: Secondary | ICD-10-CM | POA: Diagnosis not present

## 2022-07-10 DIAGNOSIS — M4316 Spondylolisthesis, lumbar region: Secondary | ICD-10-CM | POA: Diagnosis not present

## 2022-07-31 DIAGNOSIS — M48062 Spinal stenosis, lumbar region with neurogenic claudication: Secondary | ICD-10-CM | POA: Diagnosis not present

## 2022-07-31 DIAGNOSIS — M5136 Other intervertebral disc degeneration, lumbar region: Secondary | ICD-10-CM | POA: Diagnosis not present

## 2022-08-15 ENCOUNTER — Ambulatory Visit: Payer: Medicare Other | Attending: Cardiology | Admitting: Cardiology

## 2022-08-15 ENCOUNTER — Encounter: Payer: Self-pay | Admitting: Cardiology

## 2022-08-15 VITALS — BP 122/70 | HR 73 | Ht 63.5 in | Wt 189.0 lb

## 2022-08-15 DIAGNOSIS — I10 Essential (primary) hypertension: Secondary | ICD-10-CM

## 2022-08-15 NOTE — Patient Instructions (Addendum)
Medication Instructions:  Your physician recommends that you continue on your current medications as directed. Please refer to the Current Medication list given to you today.  *If you need a refill on your cardiac medications before your next appointment, please call your pharmacy*  Lab Work: Your physician recommends that you return for lab work in: tomorrow get fasting lipid panel.  If you have labs (blood work) drawn today and your tests are completely normal, you will receive your results only by: MyChart Message (if you have MyChart) OR A paper copy in the mail If you have any lab test that is abnormal or we need to change your treatment, we will call you to review the results.  Testing/Procedures: Cardiac CT scanning for calcium score (CAT scanning), is a noninvasive, special x-ray that produces cross-sectional images of the body using x-rays and a computer. CT scans help physicians diagnose and treat medical conditions. For some CT exams, a contrast material is used to enhance visibility in the area of the body being studied. CT scans provide greater clarity and reveal more details than regular x-ray exams.  Follow-Up: At [facility], you and your health needs are our priority.  As part of our continuing mission to provide you with exceptional heart care, we have created designated Provider Care Teams.  These Care Teams include your primary Cardiologist (physician) and Advanced Practice Providers (APPs -  Physician Assistants and Nurse Practitioners) who all work together to provide you with the care you need, when you need it.  We recommend signing up for the patient portal called "MyChart".  Sign up information is provided on this After Visit Summary.  MyChart is used to connect with patients for Virtual Visits (Telemedicine).  Patients are able to view lab/test results, encounter notes, upcoming appointments, etc.  Non-urgent messages can be sent to your provider as well.   To  learn more about what you can do with MyChart, go to [email or web address].    Your next appointment:   As needed  Provider:   Donato Schultz, MD

## 2022-08-15 NOTE — Progress Notes (Signed)
  Cardiology Office Note:  .   Date:  08/15/2022  ID:  Monica Hinton, DOB 03-Oct-1945, MRN [id number] PCP: Joycelyn Rua, MD  [facility] Providers Cardiologist:  Donato Schultz, MD    History of Present Illness: .   Monica Hinton is a 77 y.o. female former patient of Dr. Michaelle Copas with stroke 07/23/2021 right-sided weakness 12 mm ischemic nonhemorrhagic posterior left basal ganglier infarct echo was normal 60% A1c 5.4 neurology stopped her estradiol.  Dual antiplatelet therapy for 21 days and then aspirin.  Crestor.  Zetia.  No concern for embolic stroke.  Hypertension Hyperlipidemia  [name] husband I take care of post MI.  ROS: No fever chills nausea vomiting.  Has had some swelling, weight gain, ankle replacement  Studies Reviewed: Marland Kitchen   EKG Interpretation Date/Time:  Wednesday August 15 2022 08:01:45 EDT Ventricular Rate:  73 PR Interval:  214 QRS Duration:  68 QT Interval:  386 QTC Calculation: 425 R Axis:   82  Text Interpretation: Sinus rhythm with 1st degree A-V block Low voltage QRS When compared with ECG of 17-Aug-2021 12:20, PREVIOUS ECG IS PRESENT Confirmed by Donato Schultz ([id number]) on 08/15/2022 8:12:25 AM    Echocardiogram reviewed as above normal EF Risk Assessment/Calculations:            Physical Exam:   VS:  BP 122/70   Pulse 73   Ht 5' 3.5" (1.613 m)   Wt 189 lb (85.7 kg)   SpO2 96%   BMI 32.95 kg/m    Wt Readings from Last 3 Encounters:  08/15/22 189 lb (85.7 kg)  12/27/21 181 lb 12.8 oz (82.5 kg)  09/05/21 173 lb (78.5 kg)    GEN: Well nourished, well developed in no acute distress NECK: No JVD; No carotid bruits CARDIAC: RRR, no murmurs, rubs, gallops RESPIRATORY:  Clear to auscultation without rales, wheezing or rhonchi  ABDOMEN: Soft, non-tender, non-distended EXTREMITIES:  No edema; No deformity   ASSESSMENT AND PLAN: .    Stroke - July 2023.  Continue with goal-directed medical therapy.  Hypertension - Medications reviewed.  Continue  with current medication management.  HCTZ 25 mg.  This can help with fluid management as well.  I do not appreciate a significant amount of edema.  She does have some stiffness in her hands when clenching her fist.  Hyperlipidemia - Continue with statin therapy LDL goal less than 55.  She is interested in checking a fasting lipid panel tomorrow.  Worried about the triglycerides being slightly elevated.  226 previously.  FHX CAD --Father early 71's -We will check a coronary calcium score.  She is interested.  Ultimately, continue with aggressive prevention efforts.  Given her prior stroke, she is at increased cardiovascular risks.  Discussed with her.  She is doing all the right things including rosuvastatin and Zetia aspirin and excellent blood pressure control.  She is going to try to go to the swimming pool for exercise more especially given her ankle surgery.  Encouragement.  Obviously if further assistance is necessary, she may see me in the future.  For now continue with prevention strategies with Dr. Lenise Arena and team.       Dispo: PRN, will follow-up with lab work and studies  Signed, Donato Schultz, MD

## 2022-08-16 ENCOUNTER — Ambulatory Visit: Payer: Medicare Other | Attending: Cardiology

## 2022-08-16 DIAGNOSIS — I10 Essential (primary) hypertension: Secondary | ICD-10-CM | POA: Diagnosis not present

## 2022-08-16 LAB — LIPID PANEL
Chol/HDL Ratio: 2.9 ratio (ref 0.0–4.4)
Cholesterol, Total: 158 mg/dL (ref 100–199)
HDL: 55 mg/dL (ref >39)
LDL Chol Calc (NIH): 76 mg/dL (ref 0–99)
Triglycerides: 159 mg/dL — ABNORMAL HIGH (ref 0–149)
VLDL Cholesterol Cal: 27 mg/dL (ref 5–40)

## 2022-08-18 ENCOUNTER — Encounter: Payer: Self-pay | Admitting: Cardiology

## 2022-08-18 DIAGNOSIS — I10 Essential (primary) hypertension: Secondary | ICD-10-CM

## 2022-08-24 ENCOUNTER — Ambulatory Visit (HOSPITAL_COMMUNITY)
Admission: RE | Admit: 2022-08-24 | Discharge: 2022-08-24 | Disposition: A | Discharge status: Home / Self Care | Payer: Medicare Other | Source: Ambulatory Visit | Attending: Cardiology | Admitting: Cardiology

## 2022-08-24 DIAGNOSIS — I10 Essential (primary) hypertension: Secondary | ICD-10-CM

## 2022-08-28 NOTE — Telephone Encounter (Signed)
Please see the MyChart message reply(ies) for my assessment and plan.    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  There is no specific treatment for this other than continued modified cardiac risk factors which include good blood pressure control and good cholesterol management.  Your recent echocardiogram in July does not show any significant valvular abnormalities with the mitral valve.  Overall I would not envision that this is going to be of any concern for you in the future.  We will continue to monitor you clinically.  This patient gave consent for this Medical Advice Message and is aware that it may result in a bill to [employer], as well as the possibility of receiving a bill for a co-payment or deductible. They are an established patient, but are not seeking medical advice exclusively about a problem treated during an in person or video visit in the last seven days. I did not recommend an in person or video visit within seven days of my reply.    I spent a total of 6 minutes cumulative time within 7 days through [employer].  Donato Schultz, MD

## 2022-08-30 DIAGNOSIS — M5136 Other intervertebral disc degeneration, lumbar region: Secondary | ICD-10-CM | POA: Diagnosis not present

## 2022-09-06 DIAGNOSIS — M5136 Other intervertebral disc degeneration, lumbar region: Secondary | ICD-10-CM | POA: Diagnosis not present

## 2022-09-12 DIAGNOSIS — E559 Vitamin D deficiency, unspecified: Secondary | ICD-10-CM | POA: Diagnosis not present

## 2022-09-12 DIAGNOSIS — E010 Iodine-deficiency related diffuse (endemic) goiter: Secondary | ICD-10-CM | POA: Diagnosis not present

## 2022-09-12 DIAGNOSIS — E039 Hypothyroidism, unspecified: Secondary | ICD-10-CM | POA: Diagnosis not present

## 2022-09-12 DIAGNOSIS — E7800 Pure hypercholesterolemia, unspecified: Secondary | ICD-10-CM | POA: Diagnosis not present

## 2022-09-12 DIAGNOSIS — I10 Essential (primary) hypertension: Secondary | ICD-10-CM | POA: Diagnosis not present

## 2022-09-12 DIAGNOSIS — R7303 Prediabetes: Secondary | ICD-10-CM | POA: Diagnosis not present

## 2022-09-13 ENCOUNTER — Other Ambulatory Visit: Payer: Self-pay | Admitting: Endocrinology

## 2022-09-13 DIAGNOSIS — M5136 Other intervertebral disc degeneration, lumbar region: Secondary | ICD-10-CM | POA: Diagnosis not present

## 2022-09-13 DIAGNOSIS — E010 Iodine-deficiency related diffuse (endemic) goiter: Secondary | ICD-10-CM

## 2022-09-14 DIAGNOSIS — E039 Hypothyroidism, unspecified: Secondary | ICD-10-CM | POA: Diagnosis not present

## 2022-09-14 DIAGNOSIS — R7303 Prediabetes: Secondary | ICD-10-CM | POA: Diagnosis not present

## 2022-09-19 DIAGNOSIS — M5136 Other intervertebral disc degeneration, lumbar region: Secondary | ICD-10-CM | POA: Diagnosis not present

## 2022-09-21 ENCOUNTER — Ambulatory Visit
Admission: RE | Admit: 2022-09-21 | Discharge: 2022-09-21 | Disposition: A | Discharge status: Home / Self Care | Payer: Medicare Other | Source: Ambulatory Visit | Attending: Endocrinology | Admitting: Endocrinology

## 2022-09-21 DIAGNOSIS — E049 Nontoxic goiter, unspecified: Secondary | ICD-10-CM | POA: Diagnosis not present

## 2022-09-21 DIAGNOSIS — E010 Iodine-deficiency related diffuse (endemic) goiter: Secondary | ICD-10-CM

## 2022-09-21 DIAGNOSIS — E034 Atrophy of thyroid (acquired): Secondary | ICD-10-CM | POA: Diagnosis not present

## 2022-09-25 DIAGNOSIS — M5136 Other intervertebral disc degeneration, lumbar region: Secondary | ICD-10-CM | POA: Diagnosis not present

## 2022-09-25 DIAGNOSIS — M4316 Spondylolisthesis, lumbar region: Secondary | ICD-10-CM | POA: Diagnosis not present

## 2022-09-27 DIAGNOSIS — I10 Essential (primary) hypertension: Secondary | ICD-10-CM | POA: Diagnosis not present

## 2022-09-27 DIAGNOSIS — R7303 Prediabetes: Secondary | ICD-10-CM | POA: Diagnosis not present

## 2022-09-27 DIAGNOSIS — E7800 Pure hypercholesterolemia, unspecified: Secondary | ICD-10-CM | POA: Diagnosis not present

## 2022-10-01 DIAGNOSIS — M5136 Other intervertebral disc degeneration, lumbar region: Secondary | ICD-10-CM | POA: Diagnosis not present

## 2022-10-09 DIAGNOSIS — Z961 Presence of intraocular lens: Secondary | ICD-10-CM | POA: Diagnosis not present

## 2022-10-10 DIAGNOSIS — M5136 Other intervertebral disc degeneration, lumbar region: Secondary | ICD-10-CM | POA: Diagnosis not present

## 2022-10-15 DIAGNOSIS — M5136 Other intervertebral disc degeneration, lumbar region: Secondary | ICD-10-CM | POA: Diagnosis not present

## 2022-10-17 ENCOUNTER — Telehealth: Payer: Self-pay | Admitting: Adult Health

## 2022-10-17 NOTE — Telephone Encounter (Signed)
Pt called wanting to follow up with NP due to her not getting any better with pain in her R ankle.

## 2022-10-17 NOTE — Telephone Encounter (Signed)
I agree with your recommendation.  Previously seen for stroke follow-up and released as overall stable. If PCP concerned about a neuropathy issue, can place referral to be evaluate by an MD for further evaluation. She was also recently seen by neurosurgery for low back pain with radiculopathy in right leg with noted improvement working with PT. Thank you.

## 2022-10-17 NOTE — Telephone Encounter (Signed)
Called the patient back to get more information. She states originally she had a ankle surgery which at the time of her visit with Shanda Bumps last time she had not been fully healed. Her ankle got better but since then she has had some new problems that feel like it could potentially be neuropathy. Advised the patient that with this being a new problem she would need to follow up with PCP. The patient became frustrated because she was told she could come in and discuss with Shanda Bumps and have her assess for this. I apologized for her having been placed on the schedule but that was the reason for my call was to get more information and see if this is something that she could be seen for. She states she has seen her PCP and foot doctor and they both agree this is a stroke issue and she needs to follow up here. She became frustrated and I sympathized with her frustration informing her that because this is a new concern she would need to have her PCP send a referral in for her if they feel this is a neurology concern. If that is the case she may need to be evaluated by the MD over NP for the new problem. Patient said ok and hung up. I have cancelled the appointment that was scheduled for 10/3

## 2022-10-18 ENCOUNTER — Ambulatory Visit: Payer: Medicare Other | Admitting: Adult Health

## 2022-10-22 DIAGNOSIS — M5136 Other intervertebral disc degeneration, lumbar region with discogenic back pain only: Secondary | ICD-10-CM | POA: Diagnosis not present

## 2022-10-29 DIAGNOSIS — M5136 Other intervertebral disc degeneration, lumbar region with discogenic back pain only: Secondary | ICD-10-CM | POA: Diagnosis not present

## 2022-11-08 DIAGNOSIS — M5136 Other intervertebral disc degeneration, lumbar region with discogenic back pain only: Secondary | ICD-10-CM | POA: Diagnosis not present

## 2022-11-12 DIAGNOSIS — R262 Difficulty in walking, not elsewhere classified: Secondary | ICD-10-CM | POA: Diagnosis not present

## 2022-11-12 DIAGNOSIS — E7800 Pure hypercholesterolemia, unspecified: Secondary | ICD-10-CM | POA: Diagnosis not present

## 2022-11-12 DIAGNOSIS — R202 Paresthesia of skin: Secondary | ICD-10-CM | POA: Diagnosis not present

## 2022-11-13 DIAGNOSIS — M5136 Other intervertebral disc degeneration, lumbar region with discogenic back pain only: Secondary | ICD-10-CM | POA: Diagnosis not present

## 2022-11-15 ENCOUNTER — Encounter: Payer: Self-pay | Admitting: Neurology

## 2022-11-15 DIAGNOSIS — E7800 Pure hypercholesterolemia, unspecified: Secondary | ICD-10-CM | POA: Diagnosis not present

## 2022-11-21 DIAGNOSIS — M5136 Other intervertebral disc degeneration, lumbar region with discogenic back pain only: Secondary | ICD-10-CM | POA: Diagnosis not present

## 2022-12-12 ENCOUNTER — Encounter: Payer: Self-pay | Admitting: Neurology

## 2022-12-12 ENCOUNTER — Ambulatory Visit: Payer: Medicare Other | Admitting: Neurology

## 2022-12-12 VITALS — BP 132/71 | HR 82 | Ht 63.5 in | Wt 182.0 lb

## 2022-12-12 DIAGNOSIS — I6381 Other cerebral infarction due to occlusion or stenosis of small artery: Secondary | ICD-10-CM

## 2022-12-12 DIAGNOSIS — R200 Anesthesia of skin: Secondary | ICD-10-CM

## 2022-12-12 NOTE — Patient Instructions (Signed)
Nerve of the legs  ELECTROMYOGRAM AND NERVE CONDUCTION STUDIES (EMG/NCS) INSTRUCTIONS  How to Prepare The neurologist conducting the EMG will need to know if you have certain medical conditions. Tell the neurologist and other EMG lab personnel if you: Have a pacemaker or any other electrical medical device Take blood-thinning medications Have hemophilia, a blood-clotting disorder that causes prolonged bleeding Bathing Take a shower or bath shortly before your exam in order to remove oils from your skin. Don't apply lotions or creams before the exam.  What to Expect You'll likely be asked to change into a hospital gown for the procedure and lie down on an examination table. The following explanations can help you understand what will happen during the exam.  Electrodes. The neurologist or a technician places surface electrodes at various locations on your skin depending on where you're experiencing symptoms. Or the neurologist may insert needle electrodes at different sites depending on your symptoms.  Sensations. The electrodes will at times transmit a tiny electrical current that you may feel as a twinge or spasm. The needle electrode may cause discomfort or pain that usually ends shortly after the needle is removed. If you are concerned about discomfort or pain, you may want to talk to the neurologist about taking a short break during the exam.  Instructions. During the needle EMG, the neurologist will assess whether there is any spontaneous electrical activity when the muscle is at rest - activity that isn't present in healthy muscle tissue - and the degree of activity when you slightly contract the muscle.  He or she will give you instructions on resting and contracting a muscle at appropriate times. Depending on what muscles and nerves the neurologist is examining, he or she may ask you to change positions during the exam.  After your EMG You may experience some temporary, minor bruising  where the needle electrode was inserted into your muscle. This bruising should fade within several days. If it persists, contact your primary care doctor.

## 2022-12-12 NOTE — Progress Notes (Signed)
[facility] Neu[facility] Clinic Note - Initial Visit   Date: 12/12/2022   Monica Hinton MRN: [id number] DOB: [birth year]   Dear Dr. Lenise Hinton:  Thank you for your kind referral of Monica Hinton for consultation of right foot numbness. Although her history is well known to you, please allow [place] to reiterate it for the purpose of our medical record. The patient was accompanied to the clinic by husband who also provides collateral information.     Monica Hinton is a 77 y.o. right-handed female with hyperlipidemia, GERD, hypothyroidism, left basal ganglia stroke (2023) presenting for evaluation of right foot numbness.   IMPRESSION/PLAN: Right foot numbness involving predominately the dorsum of the foot.  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   NCS/EMG of the right > left leg will be ordered to better characterize the nature of her symptoms.   Left basal ganglia stroke manifesting with right arm and leg weakness, most likely due to small vessel disease.  Vessel imaging was normal.  She is on aspirin 81mg  and crestor 40mg .  BP managed by PCP and is well-controlled.  Further recommendations pending results.  ------------------------------------------------------------- History of present illness: She had left basal ganglia stroke in July 2023 manifesting with right arm and leg weakness.  Several months prior to her stroke, she had total right ankle replacement which took a year to completely recover and she began noticing reduced sensation over the top of her right foot and toes.  She did not have sensory loss with her stroke.  She does have a history of L4-5 fusion in 2021 by Dr. Lovell Hinton. The numbness is constant without exacerbating or alleviating factors.  She has recovered well from her stroke and has mild ongoing right hip weakness.  She has completed a long course of physical therapy with no improvement in hip  weakness or foot numbness. She uses a cane for support.   Out-side paper records, electronic medical record, and images have been reviewed where available and summarized as:  MRI brain wo contrast 07/24/2021: 1. 12 mm acute ischemic nonhemorrhagic posterior left basal ganglia infarct. 2. Underlying mild to moderate chronic microvascular ischemic disease.     Lab Results  Component Value Date   HGBA1C 5.4 07/24/2021   No results found for: "VITAMINB12" Lab Results  Component Value Date   TSH 0.404 08/17/2021   No results found for: "ESRSEDRATE", "POCTSEDRATE"  Past Medical History:  Diagnosis Date   Allergic rhinitis    Asthma    seasonal    Bursitis    r hip    GERD (gastroesophageal reflux disease)    High cholesterol    Hypercholesteremia    Hypertension    Hypothyroidism    Hypothyroidism    Insomnia    Leukocytosis    Obesity    Peripheral vascular disease (HCC)    bilateral LE edema ; edma in hands as well    PONV (postoperative nausea and vomiting)    Thrombocytosis    Vitamin D deficiency     Past Surgical History:  Procedure Laterality Date   bladder      bladder tack  20 years ago    COLONOSCOPY     EYE SURGERY Bilateral    catracts   hysterectomy      20 years    LUMBAR FUSION     OPEN SURGICAL REPAIR OF GLUTEAL TENDON Right 09/19/2016   Procedure: Right hip bursectomy and gluteal tendon repair;  Surgeon:  Monica Gross, MD;  Location: [facility];  Service: Orthopedics;  Laterality: Right;   TONSILLECTOMY     age 48    TOTAL ANKLE ARTHROPLASTY Right 04/27/2021   Procedure: TOTAL ANKLE ARTHROPLASTY;  Surgeon: Monica Arthurs, MD;  Location: [facility];  Service: Orthopedics;  Laterality: Right;     Medications:  Outpatient Encounter Medications as of 12/12/2022  Medication Sig   acetaminophen (TYLENOL) 500 MG tablet Take 1,000 mg by mouth 3 (three) times daily.   aspirin EC 81 MG tablet Take 81 mg by mouth daily. Swallow whole.    Biotin 5000 MCG TABS Take by mouth.   cetirizine (ZYRTEC) 10 MG tablet Take 10 mg by mouth daily.   ezetimibe (ZETIA) 10 MG tablet Take 10 mg by mouth daily.   gabapentin (NEURONTIN) 300 MG capsule Take 600 mg by mouth 3 (three) times daily.   hydrochlorothiazide (HYDRODIURIL) 25 MG tablet 12.5 mg daily.   levothyroxine (SYNTHROID) 112 MCG tablet Take 112 mcg by mouth daily before breakfast.   losartan (COZAAR) 100 MG tablet Take 100 mg by mouth daily.   montelukast (SINGULAIR) 10 MG tablet Take 1 tablet by mouth daily.   RABEprazole (ACIPHEX) 20 MG tablet Take 20 mg by mouth daily before breakfast.    rosuvastatin (CRESTOR) 40 MG tablet Take 40 mg by mouth daily.   triamcinolone (NASACORT ALLERGY 24HR) 55 MCG/ACT AERO nasal inhaler Place 2 sprays into the nose daily.   Vitamin D, Ergocalciferol, (DRISDOL) 1.25 MG (50000 UNIT) CAPS capsule Take 50,000 Units by mouth every Monday. Patient takes on Wednesday   No facility-administered encounter medications on file as of 12/12/2022.    Allergies:  Allergies  Allergen Reactions   Yellow Jacket Venom [Bee Venom] Anaphylaxis    Pt has EPI PEN   Codeine Nausea And Vomiting and Other (See Comments)   Milk (Cow)    Other     Lactose Intolerant     Penicillins Hives and Other (See Comments)    Many years - dr does not put pt on this   Procaine Other (See Comments)    Other reaction(s): fever,chills   Simvastatin Other (See Comments)    Other reaction(s): rash    Family History: Family History  Problem Relation Age of Onset   Colon cancer Mother        46s   Hip fracture Mother    Stroke Mother    Heart attack Father     Social History: Social History   Tobacco Use   Smoking status: Former    Current packs/day: 0.30    Average packs/day: 0.3 packs/day for 10.0 years (3.0 ttl pk-yrs)    Types: Cigarettes   Smokeless tobacco: Never   Tobacco comments:    25 years ago quit   Vaping Use   Vaping status: Never Used  Substance  Use Topics   Alcohol use: Yes    Comment: occasionally wine    Drug use: No   Social History   Social History Narrative   Are you right handed or left handed? Right Handed    Are you currently employed ? No    What is your current occupation?   Do you live at home alone? No    Who lives with you?  Husband [name]    What type of home do you live in: 1 story or 2 story? Lives in a two story home - rarely goes upstairs         Vital  Signs:  BP 132/71   Pulse 82   Ht 5' 3.5" (1.613 m)   Wt 182 lb (82.6 kg)   SpO2 97%   BMI 31.73 kg/m   Neurological Exam: MENTAL STATUS including orientation to time, place, person, recent and remote memory, attention span and concentration, language, and fund of knowledge is normal.  Speech is not dysarthric.  CRANIAL NERVES: II:  No visual field defects.     III-IV-VI: Pupils equal round and reactive to light.  Normal conjugate, extra-ocular eye movements in all directions of gaze.  No nystagmus.  No ptosis.   V:  Normal facial sensation.    VII:  Normal facial symmetry and movements.   VIII:  Normal hearing and vestibular function.   IX-X:  Normal palatal movement.   XI:  Normal shoulder shrug and head rotation.   XII:  Normal tongue strength and range of motion, no deviation or fasciculation.  MOTOR:  No atrophy, fasciculations or abnormal movements.  No pronator drift.   Upper Extremity:  Right  Left  Deltoid  5/5   5/5   Biceps  5/5   5/5   Triceps  5/5   5/5   Wrist extensors  5/5   5/5   Wrist flexors  5/5   5/5   Finger extensors  5/5   5/5   Finger flexors  5/5   5/5   Dorsal interossei  5/5   5/5   Abductor pollicis  5/5   5/5   Tone (Ashworth scale)  0  0   Lower Extremity:  Right  Left  Hip flexors  5/5   5/5   Knee flexors  5/5   5/5   Knee extensors  5/5   5/5   Dorsiflexors  5/5   5/5   Plantarflexors  5/5   5/5   Toe extensors  5/5   5/5   Toe flexors  5/5   5/5   Tone (Ashworth scale)  0  0   MSRs:                                            Right        Left brachioradialis 2+  2+  biceps 2+  2+  triceps 2+  2+  patellar 2+  2+  ankle jerk 1+  2+  Hoffman no  no  plantar response down  down   SENSORY:  Normal and symmetric perception of light touch, pinprick, vibration, and proprioception.    COORDINATION/GAIT: Normal finger-to- nose-finger.  Intact rapid alternating movements bilaterally.  Gait shows small steps, antalgic, assisted with a cane.     Thank you for allowing me to participate in patient's care.  If I can answer any additional questions, I would be pleased to do so.    Sincerely,    Annalisse Minkoff K. Allena Katz, DO

## 2022-12-25 DIAGNOSIS — E559 Vitamin D deficiency, unspecified: Secondary | ICD-10-CM | POA: Diagnosis not present

## 2022-12-25 DIAGNOSIS — E039 Hypothyroidism, unspecified: Secondary | ICD-10-CM | POA: Diagnosis not present

## 2022-12-25 DIAGNOSIS — I10 Essential (primary) hypertension: Secondary | ICD-10-CM | POA: Diagnosis not present

## 2022-12-25 DIAGNOSIS — E7800 Pure hypercholesterolemia, unspecified: Secondary | ICD-10-CM | POA: Diagnosis not present

## 2022-12-25 DIAGNOSIS — R7303 Prediabetes: Secondary | ICD-10-CM | POA: Diagnosis not present

## 2023-01-01 DIAGNOSIS — E7800 Pure hypercholesterolemia, unspecified: Secondary | ICD-10-CM | POA: Diagnosis not present

## 2023-01-01 DIAGNOSIS — E010 Iodine-deficiency related diffuse (endemic) goiter: Secondary | ICD-10-CM | POA: Diagnosis not present

## 2023-01-01 DIAGNOSIS — I10 Essential (primary) hypertension: Secondary | ICD-10-CM | POA: Diagnosis not present

## 2023-01-01 DIAGNOSIS — E871 Hypo-osmolality and hyponatremia: Secondary | ICD-10-CM | POA: Diagnosis not present

## 2023-01-01 DIAGNOSIS — E039 Hypothyroidism, unspecified: Secondary | ICD-10-CM | POA: Diagnosis not present

## 2023-01-01 DIAGNOSIS — E559 Vitamin D deficiency, unspecified: Secondary | ICD-10-CM | POA: Diagnosis not present

## 2023-01-01 DIAGNOSIS — R7303 Prediabetes: Secondary | ICD-10-CM | POA: Diagnosis not present

## 2023-01-03 DIAGNOSIS — R7303 Prediabetes: Secondary | ICD-10-CM | POA: Diagnosis not present

## 2023-01-03 DIAGNOSIS — Z0000 Encounter for general adult medical examination without abnormal findings: Secondary | ICD-10-CM | POA: Diagnosis not present

## 2023-01-03 DIAGNOSIS — E7800 Pure hypercholesterolemia, unspecified: Secondary | ICD-10-CM | POA: Diagnosis not present

## 2023-01-03 DIAGNOSIS — I10 Essential (primary) hypertension: Secondary | ICD-10-CM | POA: Diagnosis not present

## 2023-01-03 DIAGNOSIS — Z9989 Dependence on other enabling machines and devices: Secondary | ICD-10-CM | POA: Diagnosis not present

## 2023-01-03 DIAGNOSIS — I6930 Unspecified sequelae of cerebral infarction: Secondary | ICD-10-CM | POA: Diagnosis not present

## 2023-01-03 DIAGNOSIS — E039 Hypothyroidism, unspecified: Secondary | ICD-10-CM | POA: Diagnosis not present

## 2023-01-03 DIAGNOSIS — R262 Difficulty in walking, not elsewhere classified: Secondary | ICD-10-CM | POA: Diagnosis not present

## 2023-01-03 DIAGNOSIS — I69351 Hemiplegia and hemiparesis following cerebral infarction affecting right dominant side: Secondary | ICD-10-CM | POA: Diagnosis not present

## 2023-01-03 DIAGNOSIS — K219 Gastro-esophageal reflux disease without esophagitis: Secondary | ICD-10-CM | POA: Diagnosis not present

## 2023-01-03 DIAGNOSIS — J309 Allergic rhinitis, unspecified: Secondary | ICD-10-CM | POA: Diagnosis not present

## 2023-01-24 ENCOUNTER — Encounter: Payer: Self-pay | Admitting: Neurology

## 2023-01-24 ENCOUNTER — Ambulatory Visit: Payer: Medicare Other | Admitting: Neurology

## 2023-01-24 DIAGNOSIS — I6381 Other cerebral infarction due to occlusion or stenosis of small artery: Secondary | ICD-10-CM | POA: Diagnosis not present

## 2023-01-24 DIAGNOSIS — M5417 Radiculopathy, lumbosacral region: Secondary | ICD-10-CM

## 2023-01-24 DIAGNOSIS — G5731 Lesion of lateral popliteal nerve, right lower limb: Secondary | ICD-10-CM

## 2023-01-24 DIAGNOSIS — R200 Anesthesia of skin: Secondary | ICD-10-CM

## 2023-01-24 NOTE — Progress Notes (Signed)
 Follow-up Visit   Date: 01/24/2023    Monica Hinton MRN: [id number] DOB: Feb 20, 1945    Monica Hinton is a 78 y.o. right-handed Caucasian female with hyperlipidemia, GERD, hypothyroidism, left basal ganglia stroke (2023) returning to the clinic for EDX to evaluate right foot numbness.  The patient was accompanied to the clinic by self.  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 Results discussed with patient.  She has surgery overlying where the nerve travels at the ankle and it's likely contributed by this. Unfortunately, there is no treatment for nerve injury and management is supportive.  Gait disorder due to right hip weakness, residual from prior stroke.  She has completed PT and uses a cane for assistance.  History of lumbar decompression with EMG showing old L5-S1 radiculopathy, which is mild.   Return to clinic as needed  --------------------------------------------- History of present illness: She had left basal ganglia stroke in July 2023 manifesting with right arm and leg weakness.  Several months prior to her stroke, she had total right ankle replacement which took a year to completely recover and she began noticing reduced sensation over the top of her right foot and toes.  She did not have sensory loss with her stroke.  She does have a history of L4-5 fusion in 2021 by Dr. Mavis. The numbness is constant without exacerbating or alleviating factors.  She has recovered well from her stroke and has mild ongoing right hip weakness.  She has completed a long course of physical therapy with no improvement in hip weakness or foot numbness. She uses a cane for support.     UPDATE 01/24/2023:  She is here for EDX of the legs.  There has been no change in her symptoms.  She continues to have constant numbness over the right foot and difficulty with balance.  She uses a cane for assistance.   Medications:  Current  Outpatient Medications on File Prior to Visit  Medication Sig Dispense Refill   acetaminophen  (TYLENOL ) 500 MG tablet Take 1,000 mg by mouth 3 (three) times daily.     aspirin  EC 81 MG tablet Take 81 mg by mouth daily. Swallow whole.     Biotin 5000 MCG TABS Take by mouth.     cetirizine (ZYRTEC) 10 MG tablet Take 10 mg by mouth daily.     ezetimibe (ZETIA) 10 MG tablet Take 10 mg by mouth daily.     gabapentin  (NEURONTIN ) 300 MG capsule Take 600 mg by mouth 3 (three) times daily.     hydrochlorothiazide  (HYDRODIURIL ) 25 MG tablet 12.5 mg daily.     levothyroxine  (SYNTHROID ) 112 MCG tablet Take 112 mcg by mouth daily before breakfast.     losartan  (COZAAR ) 100 MG tablet Take 100 mg by mouth daily.     montelukast  (SINGULAIR ) 10 MG tablet Take 1 tablet by mouth daily.     RABEprazole (ACIPHEX) 20 MG tablet Take 20 mg by mouth daily before breakfast.      rosuvastatin  (CRESTOR ) 40 MG tablet Take 40 mg by mouth daily.     triamcinolone  (NASACORT  ALLERGY 24HR) 55 MCG/ACT AERO nasal inhaler Place 2 sprays into the nose daily.     Vitamin D, Ergocalciferol, (DRISDOL) 1.25 MG (50000 UNIT) CAPS capsule Take 50,000 Units by mouth every Monday. Patient takes on Wednesday     No current facility-administered medications on file prior to visit.    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  MRI brain wo  contrast 07/24/2021: 1. 12 mm acute ischemic nonhemorrhagic posterior left basal ganglia infarct. 2. Underlying mild to moderate chronic microvascular ischemic disease.   Thank you for allowing me to participate in patient's care.  If I can answer any additional questions, I would be pleased to do so.    Sincerely,    Jaan Fischel K. Tobie, DO

## 2023-01-24 NOTE — Procedures (Signed)
 [facility]  [address] [place], [address]  [place], [place] [address] Tel: [phone] Fax: [phone] Test Date:  01/24/2023  Patient: Monica Hinton DOB: May 15, 1945 Physician: Tonita Blanch, DO  Sex: Female Height: 5' 3 Ref Phys: Tonita Blanch, DO  ID#: [id number]   Technician:    History: This is a 78 year-old female with history of lumbar and right foot surgery referred for evaluation of right foot numbness.   NCV & EMG Findings: Extensive electrodiagnostic testing of the right lower extremity and additional studies of the left shows:  Right superficial peroneal sensory response is absent.  Left superficial peroneal and bilateral sural sensory responses are within normal limits. Right peroneal (EDB) and tibial motor responses show reduced amplitude.  Right peroneal motor response at the tibialis anterior is within normal limits.  Left tibial and peroneal motor responses are within normal limits. Bilateral tibial H reflex studies are within normal limits. Chronic motor axonal loss changes are seen affecting the right L5-S1 myotome, without accompanying active denervation.  These findings are not present in the left lower extremity.  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   ___________________________ Tonita Blanch, DO    Nerve Conduction Studies   Stim Site NR Peak (ms) Norm Peak (ms) O-P Amp (V) Norm O-P Amp  Left Sup Peroneal Anti Sensory (Ant Lat Mall)  32 C  12 cm    3.1 <4.6 7.3 >3  Right Sup Peroneal Anti Sensory (Ant Lat Mall)  32 C  12 cm *NR  <4.6  >3  Left Sural Anti Sensory (Lat Mall)  32 C  Calf    3.3 <4.6 5.7 >3  Right Sural Anti Sensory (Lat Mall)  32 C  Calf    3.5 <4.6 5.8 >3     Stim Site NR Onset (ms) Norm Onset (ms) O-P Amp (mV) Norm O-P Amp Site1 Site2 Delta-0 (ms) Dist (cm) Vel (m/s) Norm Vel (m/s)  Left Peroneal  Motor (Ext Dig Brev)  32 C  Ankle    3.8 <6.0 2.5 >2.5 B Fib Ankle 7.8 34.0 44 >40  B Fib    11.6  2.1  Poplt B Fib 1.6 8.0 50 >40  Poplt    13.2  2.1         Right Peroneal Motor (Ext Dig Brev)  32 C  Ankle    4.5 <6.0 *0.6 >2.5 B Fib Ankle 7.5 33.0 44 >40  B Fib    12.0  0.6  Poplt B Fib 1.5 8.0 53 >40  Poplt    13.5  0.5         Left Peroneal TA Motor (Tib Ant)  32 C  Fib Head    2.7 <4.5 3.9 >3 Poplit Fib Head 1.3 8.0 62 >40  Poplit    4.0 <5.7 3.9         Right Peroneal TA Motor (Tib Ant)  32 C  Fib Head    2.9 <4.5 4.2 >3 Poplit Fib Head 1.3 8.0 62 >40  Poplit    4.2 <5.7 3.9         Left Tibial Motor (Abd Hall Brev)  32 C  Ankle    6.0 <6.0 4.5 >4 Knee Ankle 7.8 38.0 49 >40  Knee    13.8  0.7         Right Tibial Motor (Abd Hall Brev)  32 C  Ankle  4.9 <6.0 *1.4 >4 Knee Ankle 8.9 41.0 46 >40  Knee    13.8  0.8          Electromyography   Side Muscle Ins.Act Fibs Fasc Recrt Amp Dur Poly Activation Comment  Right AntTibialis Nml Nml Nml *1- *1+ *1+ *1+ Nml N/A  Right Gastroc Nml Nml Nml *1- *1+ *1+ *1+ Nml N/A  Right Flex Dig Long Nml Nml Nml *1- *1+ *1+ *1+ Nml N/A  Right RectFemoris Nml Nml Nml Nml Nml Nml Nml Nml N/A  Right BicepsFemS Nml Nml Nml *1- *1+ *1+ *1+ Nml N/A  Right GluteusMed Nml Nml Nml *1- *1+ *1+ *1+ Nml N/A  Left AntTibialis Nml Nml Nml Nml Nml Nml Nml Nml N/A  Left Gastroc Nml Nml Nml Nml Nml Nml Nml Nml N/A  Left Flex Dig Long Nml Nml Nml Nml Nml Nml Nml Nml N/A  Left RectFemoris Nml Nml Nml Nml Nml Nml Nml Nml N/A  Left GluteusMed Nml Nml Nml Nml Nml Nml Nml Nml N/A  Left BicepsFemS Nml Nml Nml Nml Nml Nml Nml Nml N/A      Waveforms:

## 2023-01-30 DIAGNOSIS — Z96661 Presence of right artificial ankle joint: Secondary | ICD-10-CM | POA: Diagnosis not present

## 2023-01-30 DIAGNOSIS — M19071 Primary osteoarthritis, right ankle and foot: Secondary | ICD-10-CM | POA: Diagnosis not present

## 2023-02-04 ENCOUNTER — Ambulatory Visit: Payer: Medicare Other | Admitting: Podiatry

## 2023-02-07 DIAGNOSIS — M4316 Spondylolisthesis, lumbar region: Secondary | ICD-10-CM | POA: Diagnosis not present

## 2023-02-07 DIAGNOSIS — M5416 Radiculopathy, lumbar region: Secondary | ICD-10-CM | POA: Diagnosis not present

## 2023-02-08 ENCOUNTER — Other Ambulatory Visit: Payer: Self-pay

## 2023-02-08 DIAGNOSIS — M5417 Radiculopathy, lumbosacral region: Secondary | ICD-10-CM

## 2023-02-08 DIAGNOSIS — G5731 Lesion of lateral popliteal nerve, right lower limb: Secondary | ICD-10-CM

## 2023-02-08 DIAGNOSIS — I69398 Other sequelae of cerebral infarction: Secondary | ICD-10-CM

## 2023-02-08 DIAGNOSIS — G8191 Hemiplegia, unspecified affecting right dominant side: Secondary | ICD-10-CM

## 2023-02-08 DIAGNOSIS — I6381 Other cerebral infarction due to occlusion or stenosis of small artery: Secondary | ICD-10-CM

## 2023-02-08 DIAGNOSIS — R200 Anesthesia of skin: Secondary | ICD-10-CM

## 2023-02-11 DIAGNOSIS — H0015 Chalazion left lower eyelid: Secondary | ICD-10-CM | POA: Diagnosis not present

## 2023-02-14 ENCOUNTER — Ambulatory Visit: Payer: Medicare Other | Attending: Neurology

## 2023-02-14 DIAGNOSIS — R262 Difficulty in walking, not elsewhere classified: Secondary | ICD-10-CM | POA: Insufficient documentation

## 2023-02-14 DIAGNOSIS — R269 Unspecified abnormalities of gait and mobility: Secondary | ICD-10-CM | POA: Diagnosis not present

## 2023-02-14 DIAGNOSIS — G8191 Hemiplegia, unspecified affecting right dominant side: Secondary | ICD-10-CM | POA: Insufficient documentation

## 2023-02-14 DIAGNOSIS — R200 Anesthesia of skin: Secondary | ICD-10-CM | POA: Diagnosis not present

## 2023-02-14 DIAGNOSIS — M6281 Muscle weakness (generalized): Secondary | ICD-10-CM | POA: Diagnosis not present

## 2023-02-14 DIAGNOSIS — I69398 Other sequelae of cerebral infarction: Secondary | ICD-10-CM | POA: Diagnosis not present

## 2023-02-14 DIAGNOSIS — I6381 Other cerebral infarction due to occlusion or stenosis of small artery: Secondary | ICD-10-CM | POA: Diagnosis not present

## 2023-02-14 DIAGNOSIS — R2689 Other abnormalities of gait and mobility: Secondary | ICD-10-CM | POA: Diagnosis not present

## 2023-02-14 DIAGNOSIS — M5417 Radiculopathy, lumbosacral region: Secondary | ICD-10-CM | POA: Insufficient documentation

## 2023-02-14 DIAGNOSIS — G5731 Lesion of lateral popliteal nerve, right lower limb: Secondary | ICD-10-CM | POA: Diagnosis not present

## 2023-02-14 NOTE — Therapy (Signed)
OUTPATIENT PHYSICAL THERAPY NEURO EVALUATION   Patient Name: Monica Hinton MRN: [id number] DOB:1945/01/26, 78 y.o., female Today's Date: 02/14/2023   PCP: Joycelyn Rua, MD REFERRING PROVIDER: Nita Sickle, DO  END OF SESSION:  PT End of Session - 02/14/23 0832     Visit Number 1    Number of Visits 9    Date for PT Re-Evaluation 03/15/23    Authorization Type [employer] medicare    PT Start Time [id number]    PT Stop Time 0923    PT Time Calculation (min) 40 min    Equipment Utilized During Treatment Gait belt    Activity Tolerance Patient tolerated treatment well    Behavior During Therapy WFL for tasks assessed/performed             Past Medical History:  Diagnosis Date   Allergic rhinitis    Asthma    seasonal    Bursitis    r hip    GERD (gastroesophageal reflux disease)    High cholesterol    Hypercholesteremia    Hypertension    Hypothyroidism    Hypothyroidism    Insomnia    Leukocytosis    Obesity    Peripheral vascular disease (HCC)    bilateral LE edema ; edma in hands as well    PONV (postoperative nausea and vomiting)    Thrombocytosis    Vitamin D deficiency    Past Surgical History:  Procedure Laterality Date   bladder      bladder tack  20 years ago    COLONOSCOPY     EYE SURGERY Bilateral    catracts   hysterectomy      20 years    LUMBAR FUSION     OPEN SURGICAL REPAIR OF GLUTEAL TENDON Right 09/19/2016   Procedure: Right hip bursectomy and gluteal tendon repair;  Surgeon: Ollen Gross, MD;  Location: [facility];  Service: Orthopedics;  Laterality: Right;   TONSILLECTOMY     age 62    TOTAL ANKLE ARTHROPLASTY Right 04/27/2021   Procedure: TOTAL ANKLE ARTHROPLASTY;  Surgeon: Toni Arthurs, MD;  Location: [facility];  Service: Orthopedics;  Laterality: Right;   Patient Active Problem List   Diagnosis Date Noted   Syncope 08/17/2021   Leukocytosis 08/17/2021   History of CVA (cerebrovascular accident) 08/17/2021   Hormone  replacement therapy 07/25/2021   Essential hypertension 07/25/2021   Dyslipidemia 07/25/2021   Asthma, chronic 07/25/2021   Acute CVA (cerebrovascular accident) (HCC) 07/24/2021   Spondylolisthesis of lumbar region 09/10/2019   Tendinopathy of right gluteus medius 09/19/2016    ONSET DATE: 02/08/23 referral  REFERRING DIAG:  I69.398,R26.9 (ICD-10-CM) - Gait disturbance, post-stroke  G81.91 (ICD-10-CM) - Right hemiparesis (HCC)  M54.17 (ICD-10-CM) - Lumbosacral radiculopathy  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  I63.81 (ICD-10-CM) - Basal ganglia stroke (HCC)  R20.0 (ICD-10-CM) - Numbness of right foot    THERAPY DIAG:  Muscle weakness (generalized) - Plan: PT plan of care cert/re-cert  Difficulty in walking, not elsewhere classified - Plan: PT plan of care cert/re-cert  Other abnormalities of gait and mobility - Plan: PT plan of care cert/re-cert  Rationale for Evaluation and Treatment: Rehabilitation  SUBJECTIVE:  SUBJECTIVE STATEMENT: Patient arrives to clinic alone, using SPC. She is well known to this clinic. She was doing pool exercises in June/July and feels as though she pinched a nerve in her back and "reverted back" to how she was before. Reports this is feeling better now though. Husband drove her here today. Per patient, Mds have instructed her to use a SPC outside.  Pt accompanied by: self  PERTINENT HISTORY: asthma, GERD, HLD, HTN, PVD, R hip bursitis, L basal ganglia CVA  PAIN:  Are you having pain? No  PRECAUTIONS: Fall   WEIGHT BEARING RESTRICTIONS: No  FALLS: Has patient fallen in last 6 months? No  PATIENT GOALS: "to walk better"  OBJECTIVE:  Note: Objective measures were completed at Evaluation unless otherwise  noted.  COGNITION: Overall cognitive status: Within functional limits for tasks assessed   SENSATION: Light touch: Impaired R foot   COORDINATION: Limited AROM heel/shin R LE, otherwise normal  POSTURE: No Significant postural limitations   LOWER EXTREMITY MMT:    MMT Right Eval Left Eval  Hip flexion 4- 4+  Hip extension    Hip abduction 4+ 4+  Hip adduction 4+ 4+  Hip internal rotation    Hip external rotation    Knee flexion 4+ 4+  Knee extension 4+ 4+  Ankle dorsiflexion 4+ 4+  Ankle plantarflexion    Ankle inversion    Ankle eversion    (Blank rows = not tested)   STAIRS: Level of Assistance: Modified independence Stair Negotiation Technique: Alternating Pattern  with Bilateral Rails Number of Stairs: 4  Height of Stairs: 6    GAIT: Gait pattern: step to pattern, step through pattern, decreased arm swing- Right, decreased arm swing- Left, decreased stride length, decreased ankle dorsiflexion- Right, decreased ankle dorsiflexion- Left, Right foot flat, Left foot flat, shuffling, antalgic, trendelenburg, narrow BOS, poor foot clearance- Right, and poor foot clearance- Left Distance walked: clinic Assistive device utilized: Single point cane and None Level of assistance: Modified independence   FUNCTIONAL TESTS:   [facility] PT Assessment - 02/14/23 0001       Standardized Balance Assessment   Standardized Balance Assessment Timed Up and Go Test    Five times sit to stand comments  14.18    10 Meter Walk .57m/s      Timed Up and Go Test   Normal TUG (seconds) 11.25      Functional Gait  Assessment   Gait assessed  Yes    Gait Level Surface Walks 20 ft, slow speed, abnormal gait pattern, evidence for imbalance or deviates 10-15 in outside of the 12 in walkway width. Requires more than 7 sec to ambulate 20 ft.    Change in Gait Speed Able to change speed, demonstrates mild gait deviations, deviates 6-10 in outside of the 12 in walkway width, or no gait  deviations, unable to achieve a major change in velocity, or uses a change in velocity, or uses an assistive device.    Gait with Horizontal Head Turns Performs head turns with moderate changes in gait velocity, slows down, deviates 10-15 in outside 12 in walkway width but recovers, can continue to walk.    Gait with Vertical Head Turns Performs task with moderate change in gait velocity, slows down, deviates 10-15 in outside 12 in walkway width but recovers, can continue to walk.    Gait and Pivot Turn Pivot turns safely in greater than 3 sec and stops with no loss of balance, or pivot turns safely within 3  sec and stops with mild imbalance, requires small steps to catch balance.    Step Over Obstacle Is able to step over one shoe box (4.5 in total height) but must slow down and adjust steps to clear box safely. May require verbal cueing.    Gait with Narrow Base of Support Ambulates less than 4 steps heel to toe or cannot perform without assistance.    Gait with Eyes Closed Walks 20 ft, slow speed, abnormal gait pattern, evidence for imbalance, deviates 10-15 in outside 12 in walkway width. Requires more than 9 sec to ambulate 20 ft.    Ambulating Backwards Walks 20 ft, slow speed, abnormal gait pattern, evidence for imbalance, deviates 10-15 in outside 12 in walkway width.    Steps Two feet to a stair, must use rail.    Total Score 11                                                                                                                                          TREATMENT N/a eval   PATIENT EDUCATION: Education details: PT POC, exam findings, gait mechanics, aquatic therapy  Person educated: Patient Education method: Medical illustrator Education comprehension: verbalized understanding and needs further education  HOME EXERCISE PROGRAM: To be provided  GOALS: Goals reviewed with patient? Yes  SHORT TERM GOALS: = LTG based on PT POC length   LONG TERM GOALS:  Target date: 03/15/23  Pt will be independent with final land and aquatic HEP for improved functional strength and gait mechanics  Baseline: to be provided Goal status: INITIAL  2.  Pt will improve gait speed to >/= .36m/s to demonstrate improved community ambulation  Baseline: .11m/s Goal status: INITIAL  3.  Pt will improve FGA to >/= 16/30 to demonstrate improved balance and reduced fall risk  Baseline: 11/30 Goal status: INITIAL    ASSESSMENT:  CLINICAL IMPRESSION: Patient is a 78 y.o. female who was seen today for physical therapy evaluation and treatment for gait impairments related to distant h/o CVA. She had previously done therapy here and developed a rather normalized gait pattern. However, she "tweaked" her back ~6 months ago and feels as though she has "reverted" back to her original gait pattern. Her gait impairments are further exacerbated by her fear of falling. Patient demonstrates increased fall risk as noted by score of 11/30 on  Functional Gait Assessment.   <22/30 = predictive of falls, <20/30 = fall in 6 months, <18/30 = predictive of falls in PD MCID: 5 points stroke population, 4 points geriatric population (ANPTA Core Set of Outcome Measures for Adults with Neurologic Conditions, 2018). Five times Sit to Stand Test (FTSS) Method: Use a straight back chair with a solid seat that is 17-18" high. Ask participant to sit on the chair with arms folded across their chest.   Instructions: "Stand up and sit down as quickly as possible  5 times, keeping your arms folded across your chest."   Measurement: Stop timing when the participant touches the chair in sitting the 5th time.  TIME: 14.18 sec  Cut off scores indicative of increased fall risk: >12 sec CVA, >16 sec PD, >13 sec vestibular (ANPTA Core Set of Outcome Measures for Adults with Neurologic Conditions, 2018). 10 Meter Walk Test: Patient instructed to walk 10 meters (32.8 ft) as quickly and as safely as  possible at their normal speed x2 and at a fast speed x2. Time measured from 2 meter mark to 8 meter mark to accommodate ramp-up and ramp-down.  Normal speed: .4m/s Cut off scores: <0.4 m/s = household Ambulator, 0.4-0.8 m/s = limited community Ambulator, >0.8 m/s = community Ambulator, >1.2 m/s = crossing a street, <1.0 = increased fall risk MCID 0.05 m/s (small), 0.13 m/s (moderate), 0.06 m/s (significant)  (ANPTA Core Set of Outcome Measures for Adults with Neurologic Conditions, 2018). Patient completed the Timed Up and Go test (TUG) in 11.25 seconds.  Geriatrics: need for further assessment of fall risk: >/= 12 sec; Recurrent falls: > 15 sec; Vestibular Disorders fall risk: > 15 sec; Parkinson's Disease fall risk: > 16 sec ([email or web address], 2023). She would benefit from skilled PT services to address the above mentioned deficits.    OBJECTIVE IMPAIRMENTS: Abnormal gait, decreased balance, decreased coordination, decreased endurance, decreased knowledge of condition, decreased knowledge of use of DME, difficulty walking, decreased strength, impaired perceived functional ability, and impaired sensation.   ACTIVITY LIMITATIONS: carrying, lifting, squatting, stairs, locomotion level, and caring for others  PARTICIPATION LIMITATIONS: interpersonal relationship, driving, shopping, and community activity  PERSONAL FACTORS: Age, Behavior pattern, Past/current experiences, Time since onset of injury/illness/exacerbation, and 3+ comorbidities: see above  are also affecting patient's functional outcome.   REHAB POTENTIAL: Fair time since onset  CLINICAL DECISION MAKING: Stable/uncomplicated  EVALUATION COMPLEXITY: Low  PLAN:  PT FREQUENCY: 2x/week  PT DURATION: 4 weeks  PLANNED INTERVENTIONS: 97164- PT Re-evaluation, 97110-Therapeutic exercises, 97530- Therapeutic activity, 97112- Neuromuscular re-education, 97535- Self Care, 82956- Manual therapy, [phone]- Gait training, [phone]- Orthotic  Fit/training, [phone]- Aquatic Therapy, Patient/Family education, Balance training, Stair training, Vestibular training, Visual/preceptual remediation/compensation, and DME instructions  PLAN FOR NEXT SESSION: aquatic/land HEP, hip strengthening!   [name], PT [name], PT, DPT, CBIS  02/14/2023, 9:39 AM

## 2023-02-18 ENCOUNTER — Ambulatory Visit: Payer: Medicare Other | Attending: Neurology

## 2023-02-18 DIAGNOSIS — R262 Difficulty in walking, not elsewhere classified: Secondary | ICD-10-CM | POA: Insufficient documentation

## 2023-02-18 DIAGNOSIS — M6281 Muscle weakness (generalized): Secondary | ICD-10-CM | POA: Insufficient documentation

## 2023-02-18 DIAGNOSIS — R2689 Other abnormalities of gait and mobility: Secondary | ICD-10-CM | POA: Diagnosis not present

## 2023-02-18 DIAGNOSIS — R2681 Unsteadiness on feet: Secondary | ICD-10-CM | POA: Insufficient documentation

## 2023-02-18 DIAGNOSIS — R278 Other lack of coordination: Secondary | ICD-10-CM | POA: Insufficient documentation

## 2023-02-18 NOTE — Therapy (Signed)
OUTPATIENT PHYSICAL THERAPY NEURO TREATMENT   Patient Name: Monica Hinton MRN: [id number] DOB:[birth year], 78 y.o., female Today's Date: 02/18/2023   PCP: Joycelyn Rua, MD REFERRING PROVIDER: Nita Sickle, DO  END OF SESSION:  PT End of Session - 02/18/23 1105     Visit Number 2    Number of Visits 9    Date for PT Re-Evaluation 03/15/23    Authorization Type [employer] medicare    PT Start Time 1101    PT Stop Time 1143    PT Time Calculation (min) 42 min    Activity Tolerance Patient tolerated treatment well    Behavior During Therapy WFL for tasks assessed/performed             Past Medical History:  Diagnosis Date   Allergic rhinitis    Asthma    seasonal    Bursitis    r hip    GERD (gastroesophageal reflux disease)    High cholesterol    Hypercholesteremia    Hypertension    Hypothyroidism    Hypothyroidism    Insomnia    Leukocytosis    Obesity    Peripheral vascular disease (HCC)    bilateral LE edema ; edma in hands as well    PONV (postoperative nausea and vomiting)    Thrombocytosis    Vitamin D deficiency    Past Surgical History:  Procedure Laterality Date   bladder      bladder tack  20 years ago    COLONOSCOPY     EYE SURGERY Bilateral    catracts   hysterectomy      20 years    LUMBAR FUSION     OPEN SURGICAL REPAIR OF GLUTEAL TENDON Right 09/19/2016   Procedure: Right hip bursectomy and gluteal tendon repair;  Surgeon: Ollen Gross, MD;  Location: [facility];  Service: Orthopedics;  Laterality: Right;   TONSILLECTOMY     age 67    TOTAL ANKLE ARTHROPLASTY Right 04/27/2021   Procedure: TOTAL ANKLE ARTHROPLASTY;  Surgeon: Toni Arthurs, MD;  Location: [facility];  Service: Orthopedics;  Laterality: Right;   Patient Active Problem List   Diagnosis Date Noted   Syncope 08/17/2021   Leukocytosis 08/17/2021   History of CVA (cerebrovascular accident) 08/17/2021   Hormone replacement therapy 07/25/2021   Essential  hypertension 07/25/2021   Dyslipidemia 07/25/2021   Asthma, chronic 07/25/2021   Acute CVA (cerebrovascular accident) (HCC) 07/24/2021   Spondylolisthesis of lumbar region 09/10/2019   Tendinopathy of right gluteus medius 09/19/2016    ONSET DATE: 02/08/23 referral  REFERRING DIAG:  I69.398,R26.9 (ICD-10-CM) - Gait disturbance, post-stroke  G81.91 (ICD-10-CM) - Right hemiparesis (HCC)  M54.17 (ICD-10-CM) - Lumbosacral radiculopathy  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  I63.81 (ICD-10-CM) - Basal ganglia stroke (HCC)  R20.0 (ICD-10-CM) - Numbness of right foot    THERAPY DIAG:  Muscle weakness (generalized)  Difficulty in walking, not elsewhere classified  Other abnormalities of gait and mobility  Other lack of coordination  Unsteadiness on feet  Rationale for Evaluation and Treatment: Rehabilitation  SUBJECTIVE:  SUBJECTIVE STATEMENT: Patient reports doing well. Did ride the bike a few times. Denies falls.  Pt accompanied by: self  PERTINENT HISTORY: asthma, GERD, HLD, HTN, PVD, R hip bursitis, L basal ganglia CVA  PAIN:  Are you having pain? No  PRECAUTIONS: Fall  PATIENT GOALS: "to walk better"                                                                                                                              TREATMENT NMR: -reviewed HEP from previous POC (see below)  -standing SLS glute med engagement    PATIENT EDUCATION: Education details: resume previous HEP  Person educated: Patient Education method: Medical illustrator Education comprehension: verbalized understanding and needs further education  HOME EXERCISE PROGRAM: To be provided  GOALS: Goals reviewed with patient? Yes  SHORT TERM GOALS: = LTG based on PT  POC length   LONG TERM GOALS: Target date: 03/15/23  Pt will be independent with final land and aquatic HEP for improved functional strength and gait mechanics  Baseline: to be provided Goal status: INITIAL  2.  Pt will improve gait speed to >/= .41m/s to demonstrate improved community ambulation  Baseline: .66m/s Goal status: INITIAL  3.  Pt will improve FGA to >/= 16/30 to demonstrate improved balance and reduced fall risk  Baseline: 11/30 Goal status: INITIAL    ASSESSMENT:  CLINICAL IMPRESSION: Patient seen for skilled PT session with emphasis on reviewing previous HEP and adjusting PRN. Does demonstrate functional R LE weakness and PT questions limb length discrepancy contributing to severity of trendelenburg gait. Advised patient that while shortening stride length does limit time in SLS while ambulating, it does increase her risk for falling due to overall forward momentum of trunk vs feet. Patient verbalized understanding. Continue POC.    OBJECTIVE IMPAIRMENTS: Abnormal gait, decreased balance, decreased coordination, decreased endurance, decreased knowledge of condition, decreased knowledge of use of DME, difficulty walking, decreased strength, impaired perceived functional ability, and impaired sensation.   ACTIVITY LIMITATIONS: carrying, lifting, squatting, stairs, locomotion level, and caring for others  PARTICIPATION LIMITATIONS: interpersonal relationship, driving, shopping, and community activity  PERSONAL FACTORS: Age, Behavior pattern, Past/current experiences, Time since onset of injury/illness/exacerbation, and 3+ comorbidities: see above  are also affecting patient's functional outcome.   REHAB POTENTIAL: Fair time since onset  CLINICAL DECISION MAKING: Stable/uncomplicated  EVALUATION COMPLEXITY: Low  PLAN:  PT FREQUENCY: 2x/week  PT DURATION: 4 weeks  PLANNED INTERVENTIONS: 97164- PT Re-evaluation, 97110-Therapeutic exercises, 97530- Therapeutic  activity, 97112- Neuromuscular re-education, 97535- Self Care, 86578- Manual therapy, [phone]- Gait training, [phone]- Orthotic Fit/training, [phone]- Aquatic Therapy, Patient/Family education, Balance training, Stair training, Vestibular training, Visual/preceptual remediation/compensation, and DME instructions  PLAN FOR NEXT SESSION: aquatic/land HEP, hip strengthening!   [name], PT [name], PT, DPT, CBIS  02/18/2023, 12:00 PM

## 2023-02-19 DIAGNOSIS — Z1231 Encounter for screening mammogram for malignant neoplasm of breast: Secondary | ICD-10-CM | POA: Diagnosis not present

## 2023-02-25 ENCOUNTER — Ambulatory Visit: Payer: Medicare Other

## 2023-02-25 DIAGNOSIS — R278 Other lack of coordination: Secondary | ICD-10-CM

## 2023-02-25 DIAGNOSIS — R2689 Other abnormalities of gait and mobility: Secondary | ICD-10-CM | POA: Diagnosis not present

## 2023-02-25 DIAGNOSIS — R262 Difficulty in walking, not elsewhere classified: Secondary | ICD-10-CM

## 2023-02-25 DIAGNOSIS — R2681 Unsteadiness on feet: Secondary | ICD-10-CM | POA: Diagnosis not present

## 2023-02-25 DIAGNOSIS — M6281 Muscle weakness (generalized): Secondary | ICD-10-CM

## 2023-02-25 NOTE — Therapy (Signed)
 OUTPATIENT PHYSICAL THERAPY NEURO TREATMENT   Patient Name: Monica Hinton MRN: [id number] DOB:Jun 03, 1945, 78 y.o., female Today's Date: 02/25/2023   PCP: Wyn Heater, MD REFERRING PROVIDER: Reyna Cava, DO  END OF SESSION:  PT End of Session - 02/25/23 0840     Visit Number 3    Number of Visits 9    Date for PT Re-Evaluation 03/15/23    Authorization Type [employer] medicare    PT Start Time [phone]    PT Stop Time 0930    PT Time Calculation (min) 47 min    Equipment Utilized During Treatment Gait belt    Activity Tolerance Patient tolerated treatment well    Behavior During Therapy WFL for tasks assessed/performed             Past Medical History:  Diagnosis Date   Allergic rhinitis    Asthma    seasonal    Bursitis    r hip    GERD (gastroesophageal reflux disease)    High cholesterol    Hypercholesteremia    Hypertension    Hypothyroidism    Hypothyroidism    Insomnia    Leukocytosis    Obesity    Peripheral vascular disease (HCC)    bilateral LE edema ; edma in hands as well    PONV (postoperative nausea and vomiting)    Thrombocytosis    Vitamin D deficiency    Past Surgical History:  Procedure Laterality Date   bladder      bladder tack  20 years ago    COLONOSCOPY     EYE SURGERY Bilateral    catracts   hysterectomy      20 years    LUMBAR FUSION     OPEN SURGICAL REPAIR OF GLUTEAL TENDON Right 09/19/2016   Procedure: Right hip bursectomy and gluteal tendon repair;  Surgeon: Liliane Rei, MD;  Location: [facility];  Service: Orthopedics;  Laterality: Right;   TONSILLECTOMY     age 32    TOTAL ANKLE ARTHROPLASTY Right 04/27/2021   Procedure: TOTAL ANKLE ARTHROPLASTY;  Surgeon: Amada Backer, MD;  Location: [facility];  Service: Orthopedics;  Laterality: Right;   Patient Active Problem List   Diagnosis Date Noted   Syncope 08/17/2021   Leukocytosis 08/17/2021   History of CVA (cerebrovascular accident) 08/17/2021   Hormone  replacement therapy 07/25/2021   Essential hypertension 07/25/2021   Dyslipidemia 07/25/2021   Asthma, chronic 07/25/2021   Acute CVA (cerebrovascular accident) (HCC) 07/24/2021   Spondylolisthesis of lumbar region 09/10/2019   Tendinopathy of right gluteus medius 09/19/2016    ONSET DATE: 02/08/23 referral  REFERRING DIAG:  I69.398,R26.9 (ICD-10-CM) - Gait disturbance, post-stroke  G81.91 (ICD-10-CM) - Right hemiparesis (HCC)  M54.17 (ICD-10-CM) - Lumbosacral radiculopathy  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 Denies falls. Exercises are going well.  Pt accompanied by: self  PERTINENT HISTORY: asthma, GERD, HLD, HTN, PVD, R hip bursitis, L basal ganglia CVA  PAIN:  Are you having pain? No  PRECAUTIONS: Fall  PATIENT GOALS: "to walk better"                                                                                                                              TREATMENT NMR: -blaze pods on 2 steps L UE support 3x1 min 30s rest  -heel walking in // bars x3 laps -heel walking to targets in // bars x3 laps  -resisted fwd/backward gait with emphasis on heel strike -carryover to overground gait with emphasis on heel strike  Self care/home management: -discussion on various sneakers    PATIENT EDUCATION: Education details: continue  HEP, see above Person educated: Patient Education method: Explanation and Demonstration Education comprehension: verbalized understanding and needs further education  HOME EXERCISE PROGRAM: Provided previous POC  GOALS: Goals reviewed with patient? Yes  SHORT TERM GOALS: = LTG based on PT POC length   LONG TERM GOALS: Target date: 03/15/23  Pt will be independent with final land and aquatic HEP for improved functional strength and gait mechanics  Baseline: to be provided Goal status: INITIAL  2.  Pt will improve gait speed to >/= .44m/s to demonstrate improved community ambulation  Baseline: .6m/s Goal status: INITIAL  3.  Pt will improve FGA to >/= 16/30 to demonstrate improved balance and reduced fall risk  Baseline: 11/30 Goal status: INITIAL    ASSESSMENT:  CLINICAL IMPRESSION: Patient seen for skilled PT with emphasis on increased R LE muscle engagement for further gait mechanics improvements. Continues with trendelenburg gait impacted by decreased R ankle ROM, R hip abductor weakness/poor coordination, and relative R LE leg length discrepancy. Continue POC.    OBJECTIVE IMPAIRMENTS: Abnormal gait, decreased balance, decreased coordination, decreased endurance, decreased knowledge of condition, decreased knowledge of use of DME, difficulty walking, decreased strength, impaired perceived functional ability, and impaired sensation.   ACTIVITY LIMITATIONS: carrying, lifting, squatting, stairs, locomotion level, and caring for others  PARTICIPATION LIMITATIONS: interpersonal relationship, driving, shopping, and community activity  PERSONAL FACTORS: Age, Behavior pattern, Past/current experiences, Time since onset of injury/illness/exacerbation, and 3+ comorbidities: see above  are also affecting patient's functional outcome.   REHAB POTENTIAL: Fair time since onset  CLINICAL DECISION MAKING: Stable/uncomplicated  EVALUATION COMPLEXITY: Low  PLAN:  PT FREQUENCY:  2x/week  PT DURATION: 4 weeks  PLANNED INTERVENTIONS: 97164- PT Re-evaluation, 97110-Therapeutic exercises, 97530- Therapeutic activity, 97112- Neuromuscular re-education, 97535- Self Care, 09811- Manual therapy, [phone]- Gait training, [phone]- Orthotic Fit/training, [phone]- Aquatic Therapy, Patient/Family education, Balance training, Stair training, Vestibular training, Visual/preceptual remediation/compensation, and DME instructions  PLAN FOR NEXT SESSION: aquatic/land HEP, hip strengthening!   Rebecca Campus, PT Rebecca Campus, PT, DPT, CBIS  02/25/2023, 9:47 AM

## 2023-03-04 ENCOUNTER — Ambulatory Visit: Payer: Medicare Other

## 2023-03-04 DIAGNOSIS — R2689 Other abnormalities of gait and mobility: Secondary | ICD-10-CM | POA: Diagnosis not present

## 2023-03-04 DIAGNOSIS — R262 Difficulty in walking, not elsewhere classified: Secondary | ICD-10-CM | POA: Diagnosis not present

## 2023-03-04 DIAGNOSIS — R2681 Unsteadiness on feet: Secondary | ICD-10-CM | POA: Diagnosis not present

## 2023-03-04 DIAGNOSIS — R278 Other lack of coordination: Secondary | ICD-10-CM

## 2023-03-04 DIAGNOSIS — M6281 Muscle weakness (generalized): Secondary | ICD-10-CM | POA: Diagnosis not present

## 2023-03-04 NOTE — Therapy (Signed)
 OUTPATIENT PHYSICAL THERAPY NEURO TREATMENT   Patient Name: Monica Hinton MRN: [id number] DOB:12-09-1945, 78 y.o., female Today's Date: 03/04/2023   PCP: Joycelyn Rua, MD REFERRING PROVIDER: Nita Sickle, DO  END OF SESSION:  PT End of Session - 03/04/23 1143     Visit Number 4    Number of Visits 9    Date for PT Re-Evaluation 03/15/23    Authorization Type [employer] medicare    PT Start Time 1140    PT Stop Time 1226    PT Time Calculation (min) 46 min    Activity Tolerance Patient tolerated treatment well    Behavior During Therapy WFL for tasks assessed/performed             Past Medical History:  Diagnosis Date   Allergic rhinitis    Asthma    seasonal    Bursitis    r hip    GERD (gastroesophageal reflux disease)    High cholesterol    Hypercholesteremia    Hypertension    Hypothyroidism    Hypothyroidism    Insomnia    Leukocytosis    Obesity    Peripheral vascular disease (HCC)    bilateral LE edema ; edma in hands as well    PONV (postoperative nausea and vomiting)    Thrombocytosis    Vitamin D deficiency    Past Surgical History:  Procedure Laterality Date   bladder      bladder tack  20 years ago    COLONOSCOPY     EYE SURGERY Bilateral    catracts   hysterectomy      20 years    LUMBAR FUSION     OPEN SURGICAL REPAIR OF GLUTEAL TENDON Right 09/19/2016   Procedure: Right hip bursectomy and gluteal tendon repair;  Surgeon: Ollen Gross, MD;  Location: [facility];  Service: Orthopedics;  Laterality: Right;   TONSILLECTOMY     age 67    TOTAL ANKLE ARTHROPLASTY Right 04/27/2021   Procedure: TOTAL ANKLE ARTHROPLASTY;  Surgeon: Toni Arthurs, MD;  Location: [facility];  Service: Orthopedics;  Laterality: Right;   Patient Active Problem List   Diagnosis Date Noted   Syncope 08/17/2021   Leukocytosis 08/17/2021   History of CVA (cerebrovascular accident) 08/17/2021   Hormone replacement therapy 07/25/2021   Essential  hypertension 07/25/2021   Dyslipidemia 07/25/2021   Asthma, chronic 07/25/2021   Acute CVA (cerebrovascular accident) (HCC) 07/24/2021   Spondylolisthesis of lumbar region 09/10/2019   Tendinopathy of right gluteus medius 09/19/2016    ONSET DATE: 02/08/23 referral  REFERRING DIAG:  I69.398,R26.9 (ICD-10-CM) - Gait disturbance, post-stroke  G81.91 (ICD-10-CM) - Right hemiparesis (HCC)  M54.17 (ICD-10-CM) - Lumbosacral radiculopathy  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 Did order different sneakers, but hasn't broken them in yet. Denies falls.  Pt accompanied by: self  PERTINENT HISTORY: asthma, GERD, HLD, HTN, PVD, R hip bursitis, L basal ganglia CVA  PAIN:  Are you having pain? No  PRECAUTIONS: Fall  PATIENT GOALS: "to walk better"                                                                                                                              TREATMENT Therex: -seated resisted dorsiflexion, eversion, inversion -seated ankle AROM on wobble board -lateral stepping on foam balance beam to toe tap colored dots -tandem gait foam balance beam to toe tap colored dots   PATIENT EDUCATION: Education details: continue HEP Person educated: Patient Education method: Medical illustrator Education comprehension: verbalized  understanding and needs further education  HOME EXERCISE PROGRAM: Provided previous POC  GOALS: Goals reviewed with patient? Yes  SHORT TERM GOALS: = LTG based on PT POC length   LONG TERM GOALS: Target date: 03/15/23  Pt will be independent with final land and aquatic HEP for improved functional strength and gait mechanics  Baseline: to be provided Goal status: INITIAL  2.  Pt will improve gait speed to >/= .25m/s to demonstrate improved community ambulation  Baseline: .66m/s Goal status: INITIAL  3.  Pt will improve FGA to >/= 16/30 to demonstrate improved balance and reduced fall risk  Baseline: 11/30 Goal status: INITIAL    ASSESSMENT:  CLINICAL IMPRESSION: Patient seen for skilled PT session with emphasis on functional ankle strengthening and balance retraining. Continues to be limited in general use of ankle strategy primarily due to previous R ankle surgery and impaired R hip stabilizer strength resulting in poor SLS. Limited proprioceptive input also contributing to imbalance. Continue POC.   OBJECTIVE IMPAIRMENTS: Abnormal gait, decreased balance, decreased coordination, decreased endurance, decreased knowledge of condition, decreased knowledge of use of DME, difficulty walking, decreased strength, impaired perceived functional ability, and impaired sensation.   ACTIVITY LIMITATIONS: carrying, lifting, squatting, stairs, locomotion level, and caring for others  PARTICIPATION LIMITATIONS: interpersonal relationship, driving, shopping, and community activity  PERSONAL FACTORS: Age, Behavior pattern, Past/current experiences, Time since onset of injury/illness/exacerbation, and 3+ comorbidities: see above  are also affecting patient's functional outcome.   REHAB POTENTIAL: Fair time since onset  CLINICAL DECISION MAKING: Stable/uncomplicated  EVALUATION COMPLEXITY: Low  PLAN:  PT FREQUENCY: 2x/week  PT DURATION: 4 weeks  PLANNED INTERVENTIONS: 97164- PT  Re-evaluation, 97110-Therapeutic exercises, 97530- Therapeutic activity, 97112- Neuromuscular re-education, 97535- Self Care, 09811- Manual therapy, [phone]- Gait training, [phone]- Orthotic Fit/training, [phone]- Aquatic Therapy, Patient/Family education, Balance training, Stair training, Vestibular training, Visual/preceptual remediation/compensation, and DME instructions  PLAN FOR NEXT SESSION: aquatic/land HEP, hip strengthening!   [name], PT [name], PT, DPT, CBIS  03/04/2023, 12:42 PM

## 2023-03-07 ENCOUNTER — Ambulatory Visit: Payer: Medicare Other | Admitting: Physical Therapy

## 2023-03-11 ENCOUNTER — Ambulatory Visit: Payer: Medicare Other

## 2023-03-11 DIAGNOSIS — R278 Other lack of coordination: Secondary | ICD-10-CM | POA: Diagnosis not present

## 2023-03-11 DIAGNOSIS — R262 Difficulty in walking, not elsewhere classified: Secondary | ICD-10-CM | POA: Diagnosis not present

## 2023-03-11 DIAGNOSIS — M6281 Muscle weakness (generalized): Secondary | ICD-10-CM

## 2023-03-11 DIAGNOSIS — R2689 Other abnormalities of gait and mobility: Secondary | ICD-10-CM | POA: Diagnosis not present

## 2023-03-11 DIAGNOSIS — R2681 Unsteadiness on feet: Secondary | ICD-10-CM

## 2023-03-11 NOTE — Therapy (Signed)
 OUTPATIENT PHYSICAL THERAPY NEURO TREATMENT/ RE-CERT   Patient Name: Monica Hinton MRN: [id number] DOB:30-Oct-1945, 78 y.o., female Today's Date: 03/11/2023   PCP: Joycelyn Rua, MD REFERRING PROVIDER: Nita Sickle, DO  END OF SESSION:  PT End of Session - 03/11/23 0842     Visit Number 5    Number of Visits 13   re-cert   Date for PT Re-Evaluation 40/98/11   re-cert   Authorization Type [employer] medicare    Progress Note Due on Visit 10    PT Start Time 0845    PT Stop Time 0928    PT Time Calculation (min) 43 min    Equipment Utilized During Treatment Gait belt    Activity Tolerance Patient tolerated treatment well    Behavior During Therapy WFL for tasks assessed/performed             Past Medical History:  Diagnosis Date   Allergic rhinitis    Asthma    seasonal    Bursitis    r hip    GERD (gastroesophageal reflux disease)    High cholesterol    Hypercholesteremia    Hypertension    Hypothyroidism    Hypothyroidism    Insomnia    Leukocytosis    Obesity    Peripheral vascular disease (HCC)    bilateral LE edema ; edma in hands as well    PONV (postoperative nausea and vomiting)    Thrombocytosis    Vitamin D deficiency    Past Surgical History:  Procedure Laterality Date   bladder      bladder tack  20 years ago    COLONOSCOPY     EYE SURGERY Bilateral    catracts   hysterectomy      20 years    LUMBAR FUSION     OPEN SURGICAL REPAIR OF GLUTEAL TENDON Right 09/19/2016   Procedure: Right hip bursectomy and gluteal tendon repair;  Surgeon: Ollen Gross, MD;  Location: [facility];  Service: Orthopedics;  Laterality: Right;   TONSILLECTOMY     age 59    TOTAL ANKLE ARTHROPLASTY Right 04/27/2021   Procedure: TOTAL ANKLE ARTHROPLASTY;  Surgeon: Toni Arthurs, MD;  Location: [facility];  Service: Orthopedics;  Laterality: Right;   Patient Active Problem List   Diagnosis Date Noted   Syncope 08/17/2021   Leukocytosis 08/17/2021    History of CVA (cerebrovascular accident) 08/17/2021   Hormone replacement therapy 07/25/2021   Essential hypertension 07/25/2021   Dyslipidemia 07/25/2021   Asthma, chronic 07/25/2021   Acute CVA (cerebrovascular accident) (HCC) 07/24/2021   Spondylolisthesis of lumbar region 09/10/2019   Tendinopathy of right gluteus medius 09/19/2016    ONSET DATE: 02/08/23 referral  REFERRING DIAG:  I69.398,R26.9 (ICD-10-CM) - Gait disturbance, post-stroke  G81.91 (ICD-10-CM) - Right hemiparesis (HCC)  M54.17 (ICD-10-CM) - Lumbosacral radiculopathy  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  I63.81 (ICD-10-CM) - Basal ganglia stroke (HCC)  R20.0 (ICD-10-CM) - Numbness of right foot    THERAPY DIAG:  Muscle weakness (generalized) - Plan: PT plan of care cert/re-cert  Difficulty in walking, not elsewhere classified - Plan: PT plan of care cert/re-cert  Other abnormalities of gait and mobility - Plan: PT plan of care cert/re-cert  Unsteadiness on feet - Plan: PT plan of care cert/re-cert  Other lack of coordination - Plan: PT plan of care cert/re-cert  Rationale for Evaluation and Treatment: Rehabilitation  SUBJECTIVE:  SUBJECTIVE STATEMENT: Patient arrives to clinic alone with [facility]. Is breaking in her new shoes around the house. Does not feel as though they've impacted her walking. Added make up pool appt per patient request.  Pt accompanied by: self  PERTINENT HISTORY: asthma, GERD, HLD, HTN, PVD, R hip bursitis, L basal ganglia CVA  PAIN:  Are you having pain? No  PRECAUTIONS: Fall  PATIENT GOALS: "to walk better"                                                                                                                              TREATMENT Therex: -standing  gastroc/soleus stretch in // bars  -anterior walking lunge in // bars -fwd monster walk red theraband  -backward monster walk red theraband  -minisquat with 5# kettlebell U LE biased on 2" box  -mini squat with 15# Surge  -57ftx4 walking with 15# Surge  Physical Performance  [facility] PT Assessment - 03/11/23 0001       Standardized Balance Assessment   10 Meter Walk .35m/s      Functional Gait  Assessment   Gait assessed  Yes    Gait Level Surface Walks 20 ft in less than 7 sec but greater than 5.5 sec, uses assistive device, slower speed, mild gait deviations, or deviates 6-10 in outside of the 12 in walkway width.    Change in Gait Speed Able to change speed, demonstrates mild gait deviations, deviates 6-10 in outside of the 12 in walkway width, or no gait deviations, unable to achieve a major change in velocity, or uses a change in velocity, or uses an assistive device.    Gait with Horizontal Head Turns Performs head turns with moderate changes in gait velocity, slows down, deviates 10-15 in outside 12 in walkway width but recovers, can continue to walk.    Gait with Vertical Head Turns Performs task with moderate change in gait velocity, slows down, deviates 10-15 in outside 12 in walkway width but recovers, can continue to walk.    Gait and Pivot Turn Pivot turns safely in greater than 3 sec and stops with no loss of balance, or pivot turns safely within 3 sec and stops with mild imbalance, requires small steps to catch balance.    Step Over Obstacle Is able to step over one shoe box (4.5 in total height) without changing gait speed. No evidence of imbalance.    Gait with Narrow Base of Support Ambulates less than 4 steps heel to toe or cannot perform without assistance.    Gait with Eyes Closed Walks 20 ft, uses assistive device, slower speed, mild gait deviations, deviates 6-10 in outside 12 in walkway width. Ambulates 20 ft in less than 9 sec but greater than 7 sec.    Ambulating  Backwards Walks 20 ft, slow speed, abnormal gait pattern, evidence for imbalance, deviates 10-15 in outside 12 in walkway width.    Steps Alternating feet, must use rail.  Total Score 15                PATIENT EDUCATION: Education details: PT POC, exam findings Person educated: Patient Education method: Medical illustrator Education comprehension: verbalized understanding and needs further education  HOME EXERCISE PROGRAM: Provided previous POC  GOALS: Goals reviewed with patient? Yes  SHORT TERM GOALS: = LTG based on PT POC length   LONG TERM GOALS: Target date: 03/15/23  Pt will be independent with final land and aquatic HEP for improved functional strength and gait mechanics  Baseline: to be provided; provided Goal status: MET  2.  Pt will improve gait speed to >/= .80m/s to demonstrate improved community ambulation  Baseline: .39m/s; .72m/s Goal status: MET  3.  Pt will improve FGA to >/= 16/30 to demonstrate improved balance and reduced fall risk  Baseline: 11/30; 15/30 Goal status: IN PROGRESS  NEW LONG TERM GOALS: 04/05/23   1. Pt will be independent with aquatic HEP for improved return to community exercise Baseline: to be provided Goal status: NEW      ASSESSMENT:  CLINICAL IMPRESSION: Patient seen for skilled PT session with emphasis on re-cert and LE functional strengthening. Patient demonstrates increased fall risk as noted by score of 15/30 on  Functional Gait Assessment.   <22/30 = predictive of falls, <20/30 = fall in 6 months, <18/30 = predictive of falls in PD MCID: 5 points stroke population, 4 points geriatric population (ANPTA Core Set of Outcome Measures for Adults with Neurologic Conditions, 2018). 10 Meter Walk Test: Patient instructed to walk 10 meters (32.8 ft) as quickly and as safely as possible at their normal speed x2 and at a fast speed x2. Time measured from 2 meter mark to 8 meter mark to accommodate ramp-up and  ramp-down.  Normal speed: .43m/s Cut off scores: <0.4 m/s = household Ambulator, 0.4-0.8 m/s = limited community Ambulator, >0.8 m/s = community Ambulator, >1.2 m/s = crossing a street, <1.0 = increased fall risk MCID 0.05 m/s (small), 0.13 m/s (moderate), 0.06 m/s (significant)  (ANPTA Core Set of Outcome Measures for Adults with Neurologic Conditions, 2018). Patient continues to demonstrate antalgic, trendelenburg gait pattern with limited ability/attempt to achieve R LE trailing limb. This results in increased instability. She would continue to benefit from skilled PT services to address the above mentioned deficits.    OBJECTIVE IMPAIRMENTS: Abnormal gait, decreased balance, decreased coordination, decreased endurance, decreased knowledge of condition, decreased knowledge of use of DME, difficulty walking, decreased strength, impaired perceived functional ability, and impaired sensation.   ACTIVITY LIMITATIONS: carrying, lifting, squatting, stairs, locomotion level, and caring for others  PARTICIPATION LIMITATIONS: interpersonal relationship, driving, shopping, and community activity  PERSONAL FACTORS: Age, Behavior pattern, Past/current experiences, Time since onset of injury/illness/exacerbation, and 3+ comorbidities: see above  are also affecting patient's functional outcome.   REHAB POTENTIAL: Fair time since onset  CLINICAL DECISION MAKING: Stable/uncomplicated  EVALUATION COMPLEXITY: Low  PLAN:  PT FREQUENCY: 2x/week 1x a week (re-cert)  PT DURATION: 4 weeks 4 weeks (re-cert)  PLANNED INTERVENTIONS: 09811- PT Re-evaluation, 97110-Therapeutic exercises, 97530- Therapeutic activity, [id number]- Neuromuscular re-education, 97535- Self Care, 91478- Manual therapy, [phone]- Gait training, [phone]- Orthotic Fit/training, [phone]- Aquatic Therapy, Patient/Family education, Balance training, Stair training, Vestibular training, Visual/preceptual remediation/compensation, and DME  instructions  PLAN FOR NEXT SESSION: aquatic/land HEP, hip strengthening!   [name], PT [name], PT, DPT, CBIS  03/11/2023, 9:49 AM

## 2023-03-14 ENCOUNTER — Encounter: Payer: Self-pay | Admitting: Physical Therapy

## 2023-03-14 ENCOUNTER — Ambulatory Visit: Payer: Medicare Other | Admitting: Physical Therapy

## 2023-03-14 DIAGNOSIS — R278 Other lack of coordination: Secondary | ICD-10-CM

## 2023-03-14 DIAGNOSIS — M6281 Muscle weakness (generalized): Secondary | ICD-10-CM

## 2023-03-14 DIAGNOSIS — R2689 Other abnormalities of gait and mobility: Secondary | ICD-10-CM

## 2023-03-14 DIAGNOSIS — R262 Difficulty in walking, not elsewhere classified: Secondary | ICD-10-CM

## 2023-03-14 DIAGNOSIS — R2681 Unsteadiness on feet: Secondary | ICD-10-CM

## 2023-03-14 NOTE — Therapy (Signed)
 OUTPATIENT PHYSICAL THERAPY NEURO TREATMENT/ AQUATIC THERAPY   Patient Name: Monica Hinton MRN: [id number] DOB:Dec 27, 1945, 78 y.o., female Today's Date: 03/14/2023   PCP: Joycelyn Rua, MD REFERRING PROVIDER: Nita Sickle, DO  END OF SESSION:  PT End of Session - 03/14/23 0943     Visit Number 6    Number of Visits 13   re-cert   Date for PT Re-Evaluation 82/95/62   re-cert   Authorization Type [employer] medicare    Progress Note Due on Visit 10    PT Start Time 0843    PT Stop Time 0933    PT Time Calculation (min) 50 min    Equipment Utilized During Treatment Other (comment)   aquatic cuff, multicolor low resistance dumbbells   Activity Tolerance Patient tolerated treatment well    Behavior During Therapy WFL for tasks assessed/performed             Past Medical History:  Diagnosis Date   Allergic rhinitis    Asthma    seasonal    Bursitis    r hip    GERD (gastroesophageal reflux disease)    High cholesterol    Hypercholesteremia    Hypertension    Hypothyroidism    Hypothyroidism    Insomnia    Leukocytosis    Obesity    Peripheral vascular disease (HCC)    bilateral LE edema ; edma in hands as well    PONV (postoperative nausea and vomiting)    Thrombocytosis    Vitamin D deficiency    Past Surgical History:  Procedure Laterality Date   bladder      bladder tack  20 years ago    COLONOSCOPY     EYE SURGERY Bilateral    catracts   hysterectomy      20 years    LUMBAR FUSION     OPEN SURGICAL REPAIR OF GLUTEAL TENDON Right 09/19/2016   Procedure: Right hip bursectomy and gluteal tendon repair;  Surgeon: Ollen Gross, MD;  Location: [facility];  Service: Orthopedics;  Laterality: Right;   TONSILLECTOMY     age 27    TOTAL ANKLE ARTHROPLASTY Right 04/27/2021   Procedure: TOTAL ANKLE ARTHROPLASTY;  Surgeon: Toni Arthurs, MD;  Location: [facility];  Service: Orthopedics;  Laterality: Right;   Patient Active Problem List   Diagnosis  Date Noted   Syncope 08/17/2021   Leukocytosis 08/17/2021   History of CVA (cerebrovascular accident) 08/17/2021   Hormone replacement therapy 07/25/2021   Essential hypertension 07/25/2021   Dyslipidemia 07/25/2021   Asthma, chronic 07/25/2021   Acute CVA (cerebrovascular accident) (HCC) 07/24/2021   Spondylolisthesis of lumbar region 09/10/2019   Tendinopathy of right gluteus medius 09/19/2016    ONSET DATE: 02/08/23 referral  REFERRING DIAG:  I69.398,R26.9 (ICD-10-CM) - Gait disturbance, post-stroke  G81.91 (ICD-10-CM) - Right hemiparesis (HCC)  M54.17 (ICD-10-CM) - Lumbosacral radiculopathy  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  She reports she is excited for water therapy. Pt accompanied by: self  PERTINENT HISTORY: asthma, GERD, HLD, HTN, PVD, R hip bursitis, L basal ganglia CVA  PAIN:  Are you having pain? No  PRECAUTIONS: Fall  PATIENT GOALS: "to walk better"                                                                                                                              TREATMENT Aquatic therapy at [facility] - pool temperature 92 degrees   Patient seen for aquatic therapy today.  Treatment took place in water 3.6-4.8 feet deep depending upon activity.  Patient entered and exited the pool via stairs  using reciprocal pattern and bilateral rails at mod I level.   Exercises: -Water walking warmup: 4x18 ft forwards > backwards > laterally  -Lateral squat walk w/ MC DB abduction/adduction 4x18 ft -Walking march w/ MC DB contralateral swing 4x18 ft -Tandem walking unsupported at SBA 4x18 ft -STS x8 no resistance > STS x20 using MC DB shoulder extension for core activation; pt was too challenged by yellow DB progression, cues to improve sequencing -Wall supported plank kickbacks x20 alt LE -Standing unsupported hip 3-way x30 alt LE, pt has mild right compensatory lean in RLE stance -Step taps to 3rd step progressing from BUE support to none SBA -Seated back supported hamstring curls w/ aquatic cuff x20 each LE, PT moves cuff for pt due to seated balance deficits w/ added buoyancy  Patient requires buoyancy of the water for support for reduced fall risk with gait training and balance exercises with no more than SBA support. Exercises able to be performed safely in water without the risk of fall compared to those same exercises performed on land; viscosity of water needed for resistance for strengthening. Current of water provides perturbations for challenging static and dynamic balance.   PATIENT EDUCATION: Education details: Aquatic rationale and goal of developing laminated aquatic HEP prior to discharge. Person educated: Patient Education method: Medical illustrator Education comprehension: verbalized understanding and needs further education  HOME EXERCISE PROGRAM: Provided previous POC  GOALS: Goals reviewed with patient? Yes  SHORT TERM GOALS: = LTG based on PT POC length   LONG TERM GOALS: Target date: 03/15/23  Pt will be independent with final land and aquatic HEP for improved functional strength and gait mechanics  Baseline: to be provided; provided Goal status: MET  2.  Pt will improve gait speed to >/= .19m/s to demonstrate improved community  ambulation  Baseline: .54m/s; .38m/s Goal status: MET  3.  Pt will improve FGA to >/= 16/30 to demonstrate improved balance and reduced fall risk  Baseline: 11/30; 15/30 Goal status: IN PROGRESS  NEW LONG TERM GOALS: 04/05/23   1. Pt will be independent with aquatic HEP for improved return to community exercise Baseline: to be provided Goal status: NEW      ASSESSMENT:  CLINICAL IMPRESSION: Emphasis of skilled session today on hip strengthening in aquatic environment.  Patient has membership and is familiar with community based aquatic setup.  PT planning to develop HEP based on performance today as pt tolerates interventions well and without significant concerns for safety.  She is well supported by spouse who would attend any community visits to the pool.  She continues to benefit from interventions targeting trendelenburg deficits and toe off portion of gait which continues to impact her SLS and dynamic stability.  Continue per POC.  OBJECTIVE IMPAIRMENTS: Abnormal gait, decreased balance, decreased coordination, decreased endurance, decreased knowledge of condition, decreased knowledge of use of DME, difficulty walking, decreased strength, impaired perceived functional ability, and impaired sensation.   ACTIVITY LIMITATIONS: carrying, lifting, squatting, stairs, locomotion level, and caring for others  PARTICIPATION LIMITATIONS: interpersonal relationship, driving, shopping, and community activity  PERSONAL FACTORS: Age, Behavior pattern, Past/current experiences, Time since onset of injury/illness/exacerbation, and 3+ comorbidities: see above  are also affecting patient's functional outcome.   REHAB POTENTIAL: Fair time since onset  CLINICAL DECISION MAKING: Stable/uncomplicated  EVALUATION COMPLEXITY: Low  PLAN:  PT FREQUENCY: 2x/week 1x a week (re-cert)  PT DURATION: 4 weeks 4 weeks (re-cert)  PLANNED INTERVENTIONS: 91478- PT Re-evaluation, 97110-Therapeutic  exercises, 97530- Therapeutic activity, 97112- Neuromuscular re-education, 97535- Self Care, 29562- Manual therapy, [phone]- Gait training, [phone]- Orthotic Fit/training, [phone]- Aquatic Therapy, Patient/Family education, Balance training, Stair training, Vestibular training, Visual/preceptual remediation/compensation, and DME instructions  PLAN FOR NEXT SESSION: aquatic HEP, hip strengthening!   Sadie Haber, PT, DPT  03/14/2023, 9:46 AM

## 2023-03-17 IMAGING — MG MM DIGITAL SCREENING BILAT W/ TOMO AND CAD
6 of 10 series · 6 of 30 positions shown · non-contrast
Comparison: Previous exam(s).

CLINICAL DATA: Screening.

EXAM:
DIGITAL SCREENING BILATERAL MAMMOGRAM WITH TOMOSYNTHESIS AND CAD
TECHNIQUE: Bilateral screening digital craniocaudal and mediolateral oblique
mammograms were obtained. Bilateral screening digital breast
tomosynthesis was performed. The images were evaluated with
computer-aided detection.

[L CC synth-2D]
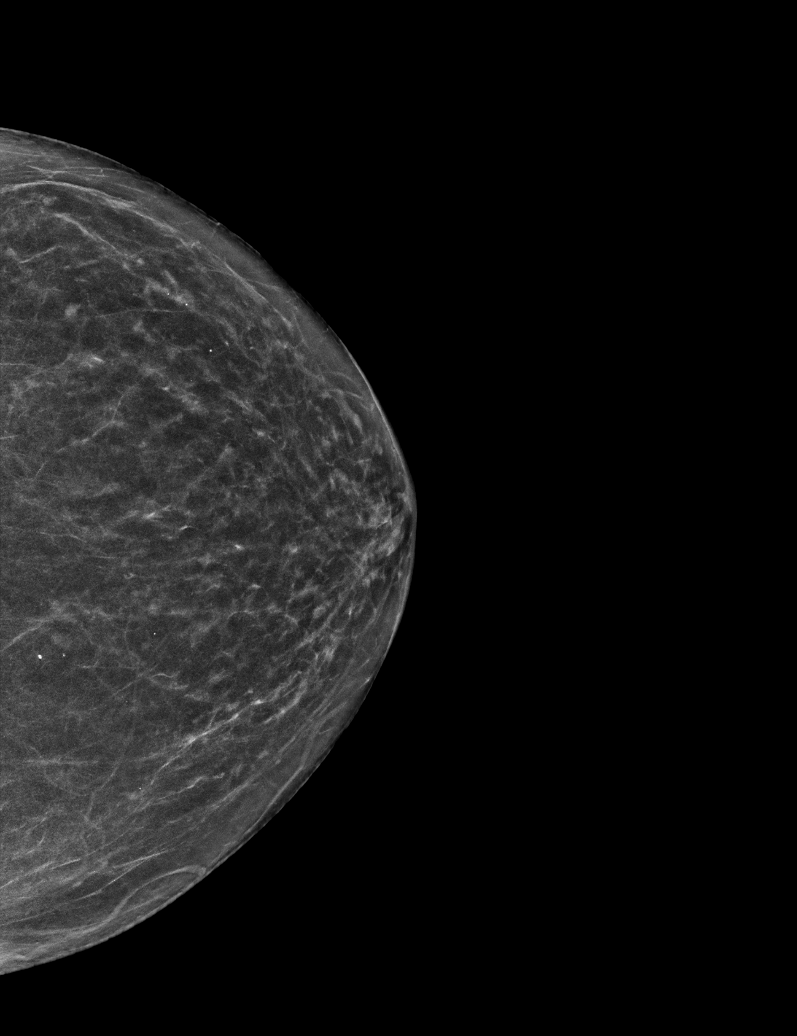

[R XCCL synth-2D]
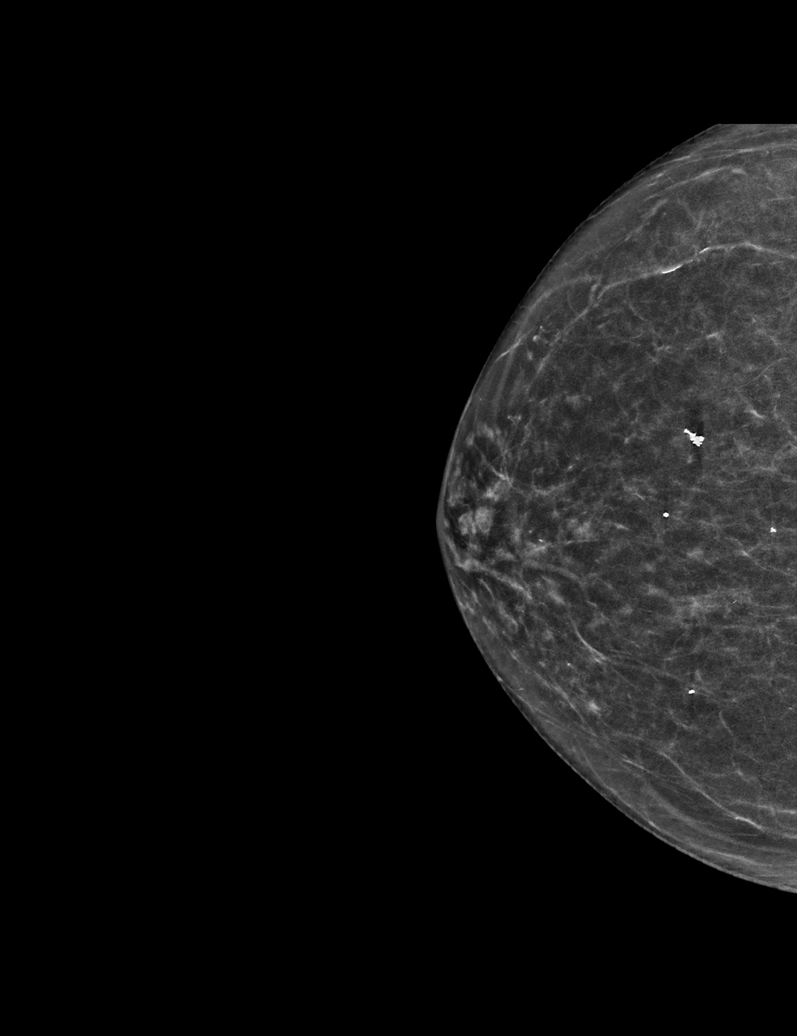

[L MLO synth-2D]
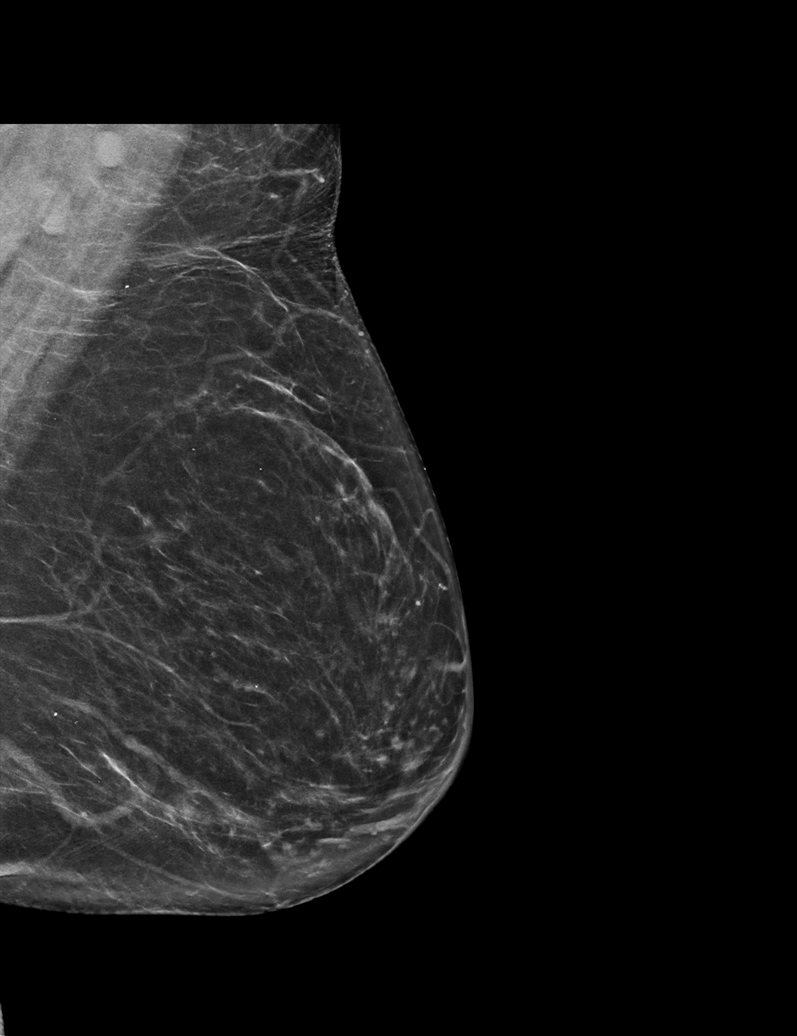

[R MLO synth-2D]
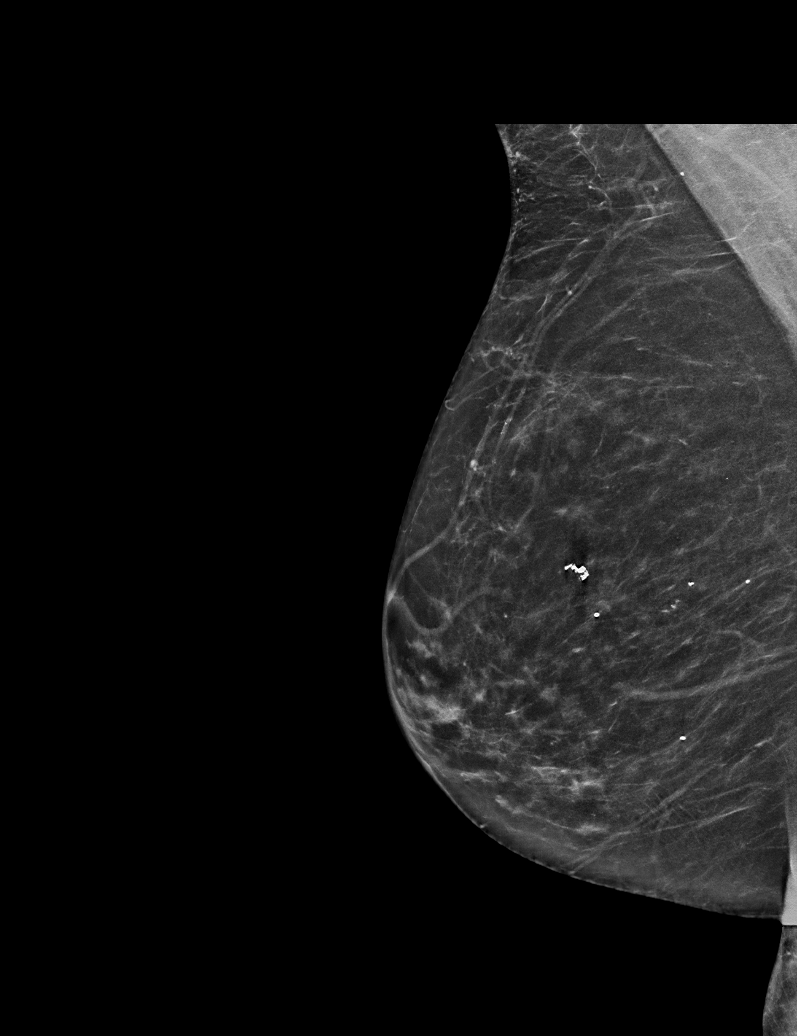

[R CC synth-2D]
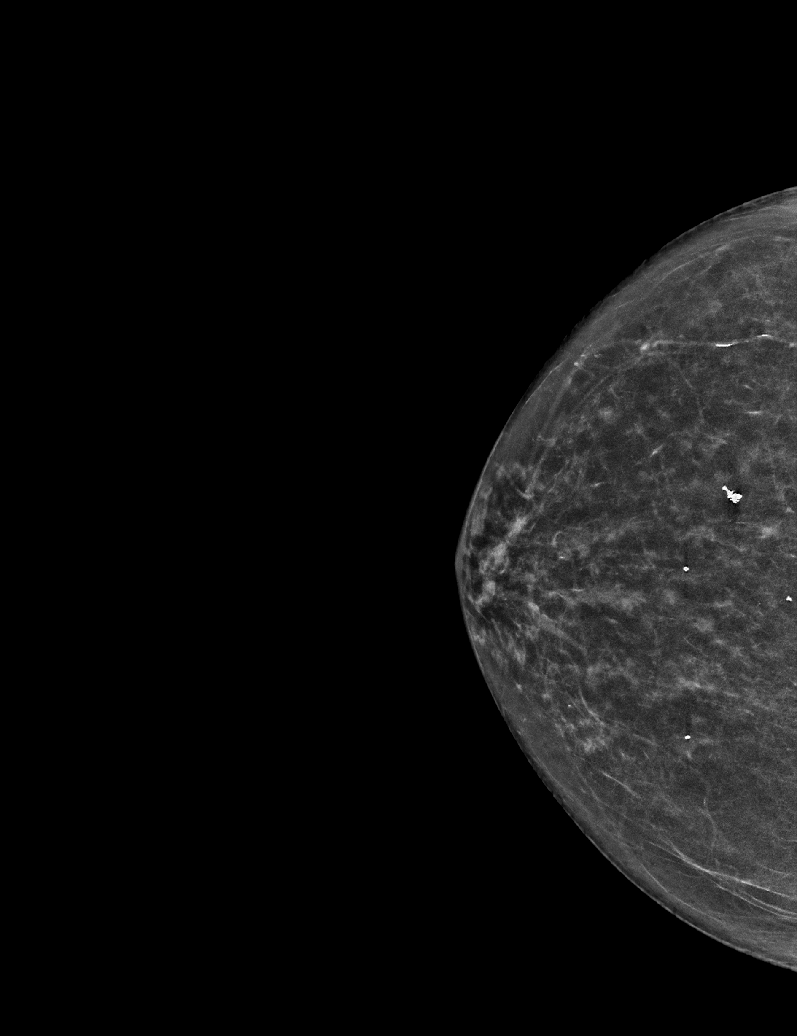

[R CC tomo · tomo slice 26/51.0]
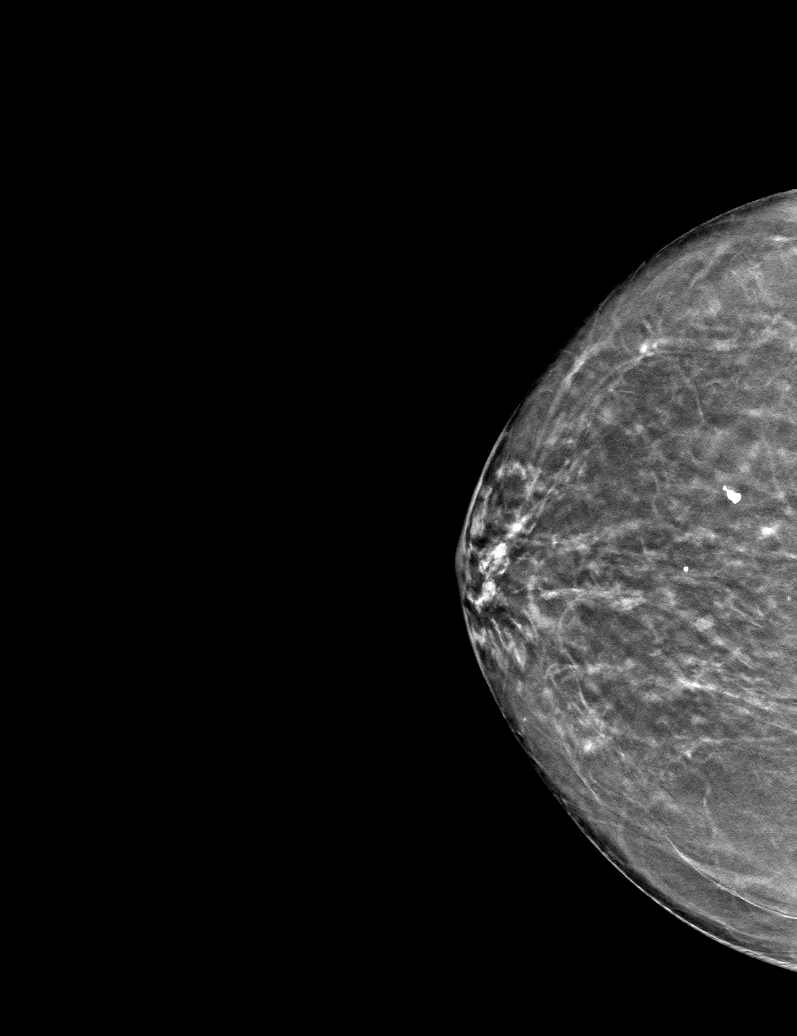

[6 of 30 positions shown; findings below may reference images not displayed]

ACR Breast Density Category b: There are scattered areas of
fibroglandular density.
FINDINGS: There are no findings suspicious for malignancy.
IMPRESSION: No mammographic evidence of malignancy. A result letter of this
screening mammogram will be mailed directly to the patient.

RECOMMENDATION:
Screening mammogram in one year. (Code:51-O-LD2)

BI-RADS CATEGORY  1: Negative.

## 2023-03-21 ENCOUNTER — Ambulatory Visit: Payer: Medicare Other | Admitting: Physical Therapy

## 2023-03-21 DIAGNOSIS — M25572 Pain in left ankle and joints of left foot: Secondary | ICD-10-CM | POA: Diagnosis not present

## 2023-03-21 DIAGNOSIS — M25372 Other instability, left ankle: Secondary | ICD-10-CM | POA: Diagnosis not present

## 2023-03-28 ENCOUNTER — Ambulatory Visit: Payer: Medicare Other | Admitting: Physical Therapy

## 2023-04-04 ENCOUNTER — Ambulatory Visit: Payer: Self-pay | Admitting: Physical Therapy

## 2023-04-04 DIAGNOSIS — M67962 Unspecified disorder of synovium and tendon, left lower leg: Secondary | ICD-10-CM | POA: Diagnosis not present

## 2023-04-04 DIAGNOSIS — M19072 Primary osteoarthritis, left ankle and foot: Secondary | ICD-10-CM | POA: Diagnosis not present

## 2023-05-06 DIAGNOSIS — E559 Vitamin D deficiency, unspecified: Secondary | ICD-10-CM | POA: Diagnosis not present

## 2023-05-06 DIAGNOSIS — E039 Hypothyroidism, unspecified: Secondary | ICD-10-CM | POA: Diagnosis not present

## 2023-05-06 DIAGNOSIS — R7303 Prediabetes: Secondary | ICD-10-CM | POA: Diagnosis not present

## 2023-05-08 DIAGNOSIS — M19072 Primary osteoarthritis, left ankle and foot: Secondary | ICD-10-CM | POA: Diagnosis not present

## 2023-05-08 DIAGNOSIS — M25872 Other specified joint disorders, left ankle and foot: Secondary | ICD-10-CM | POA: Diagnosis not present

## 2023-05-13 DIAGNOSIS — E559 Vitamin D deficiency, unspecified: Secondary | ICD-10-CM | POA: Diagnosis not present

## 2023-05-13 DIAGNOSIS — R7303 Prediabetes: Secondary | ICD-10-CM | POA: Diagnosis not present

## 2023-05-13 DIAGNOSIS — E039 Hypothyroidism, unspecified: Secondary | ICD-10-CM | POA: Diagnosis not present

## 2023-05-13 DIAGNOSIS — E871 Hypo-osmolality and hyponatremia: Secondary | ICD-10-CM | POA: Diagnosis not present

## 2023-05-13 DIAGNOSIS — I10 Essential (primary) hypertension: Secondary | ICD-10-CM | POA: Diagnosis not present

## 2023-05-13 DIAGNOSIS — E7800 Pure hypercholesterolemia, unspecified: Secondary | ICD-10-CM | POA: Diagnosis not present

## 2023-05-13 DIAGNOSIS — E010 Iodine-deficiency related diffuse (endemic) goiter: Secondary | ICD-10-CM | POA: Diagnosis not present

## 2023-05-28 DIAGNOSIS — R269 Unspecified abnormalities of gait and mobility: Secondary | ICD-10-CM | POA: Diagnosis not present

## 2023-05-28 DIAGNOSIS — M25572 Pain in left ankle and joints of left foot: Secondary | ICD-10-CM | POA: Diagnosis not present

## 2023-06-04 DIAGNOSIS — M25572 Pain in left ankle and joints of left foot: Secondary | ICD-10-CM | POA: Diagnosis not present

## 2023-06-04 DIAGNOSIS — R269 Unspecified abnormalities of gait and mobility: Secondary | ICD-10-CM | POA: Diagnosis not present

## 2023-06-07 DIAGNOSIS — R269 Unspecified abnormalities of gait and mobility: Secondary | ICD-10-CM | POA: Diagnosis not present

## 2023-06-07 DIAGNOSIS — M25572 Pain in left ankle and joints of left foot: Secondary | ICD-10-CM | POA: Diagnosis not present

## 2023-06-11 DIAGNOSIS — M25572 Pain in left ankle and joints of left foot: Secondary | ICD-10-CM | POA: Diagnosis not present

## 2023-06-11 DIAGNOSIS — R269 Unspecified abnormalities of gait and mobility: Secondary | ICD-10-CM | POA: Diagnosis not present

## 2023-06-14 DIAGNOSIS — M25572 Pain in left ankle and joints of left foot: Secondary | ICD-10-CM | POA: Diagnosis not present

## 2023-06-17 DIAGNOSIS — R269 Unspecified abnormalities of gait and mobility: Secondary | ICD-10-CM | POA: Diagnosis not present

## 2023-06-17 DIAGNOSIS — M25572 Pain in left ankle and joints of left foot: Secondary | ICD-10-CM | POA: Diagnosis not present

## 2023-06-19 DIAGNOSIS — M67962 Unspecified disorder of synovium and tendon, left lower leg: Secondary | ICD-10-CM | POA: Diagnosis not present

## 2023-06-19 DIAGNOSIS — M19072 Primary osteoarthritis, left ankle and foot: Secondary | ICD-10-CM | POA: Diagnosis not present

## 2023-06-19 DIAGNOSIS — M25872 Other specified joint disorders, left ankle and foot: Secondary | ICD-10-CM | POA: Diagnosis not present

## 2023-06-24 DIAGNOSIS — M25571 Pain in right ankle and joints of right foot: Secondary | ICD-10-CM | POA: Diagnosis not present

## 2023-06-26 DIAGNOSIS — G8190 Hemiplegia, unspecified affecting unspecified side: Secondary | ICD-10-CM | POA: Diagnosis not present

## 2023-06-26 DIAGNOSIS — E7800 Pure hypercholesterolemia, unspecified: Secondary | ICD-10-CM | POA: Diagnosis not present

## 2023-06-26 DIAGNOSIS — E039 Hypothyroidism, unspecified: Secondary | ICD-10-CM | POA: Diagnosis not present

## 2023-06-26 DIAGNOSIS — J309 Allergic rhinitis, unspecified: Secondary | ICD-10-CM | POA: Diagnosis not present

## 2023-06-26 DIAGNOSIS — R262 Difficulty in walking, not elsewhere classified: Secondary | ICD-10-CM | POA: Diagnosis not present

## 2023-06-26 DIAGNOSIS — I10 Essential (primary) hypertension: Secondary | ICD-10-CM | POA: Diagnosis not present

## 2023-06-26 DIAGNOSIS — K219 Gastro-esophageal reflux disease without esophagitis: Secondary | ICD-10-CM | POA: Diagnosis not present

## 2023-06-28 DIAGNOSIS — M25572 Pain in left ankle and joints of left foot: Secondary | ICD-10-CM | POA: Diagnosis not present

## 2023-07-01 DIAGNOSIS — M25571 Pain in right ankle and joints of right foot: Secondary | ICD-10-CM | POA: Diagnosis not present

## 2023-07-01 IMAGING — XA DG ANKLE COMPLETE 3+V*R*
1 series · 5 of 5 positions shown · non-contrast
Comparison: None.

CLINICAL DATA: ORIF

EXAM:
RIGHT ANKLE - COMPLETE 3+ VIEW

[Series 1: unknown protocol · 0.30mm/px · 5 of 5 slices shown]
[im 1/5]
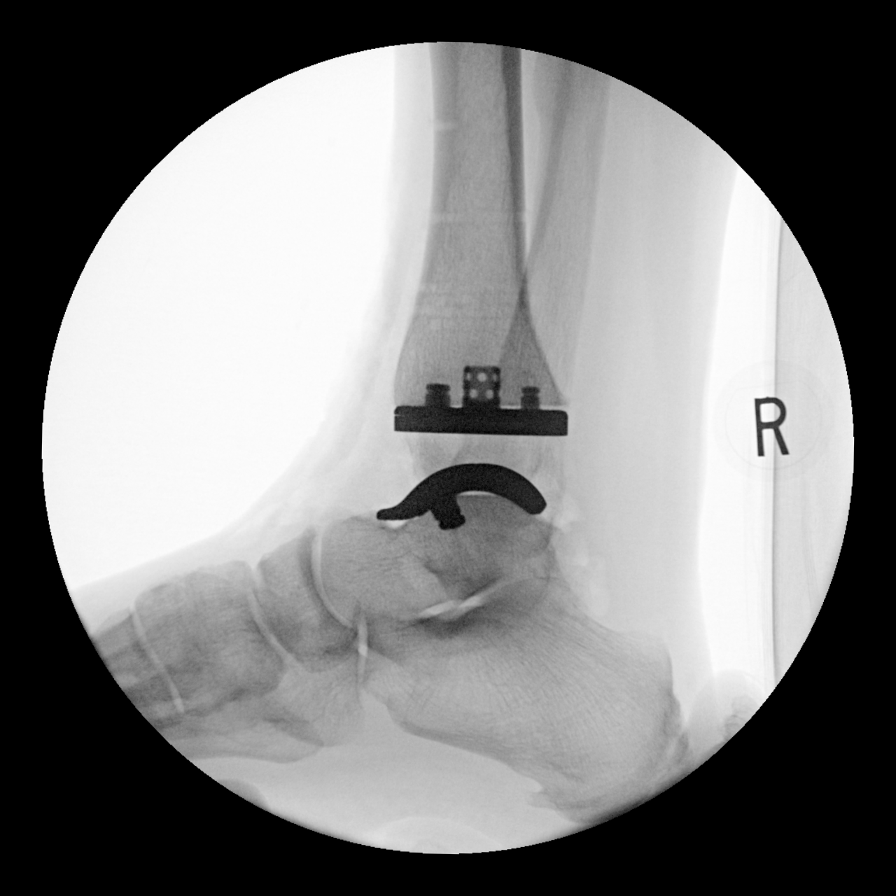
[im 2/5]
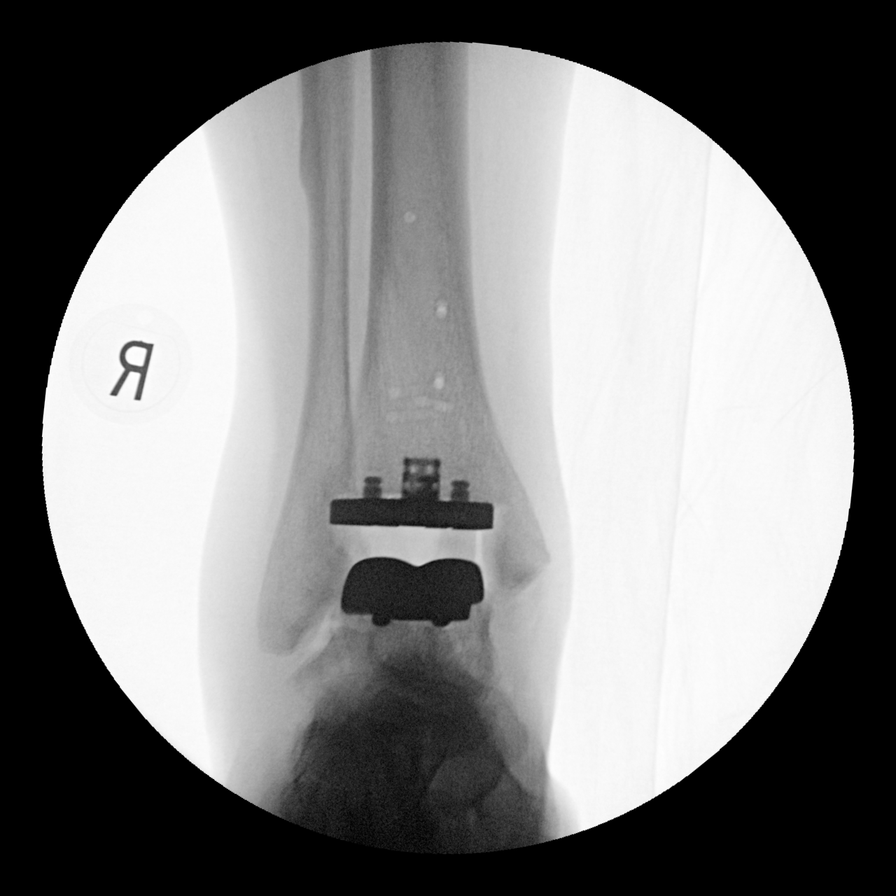
[im 3/5]
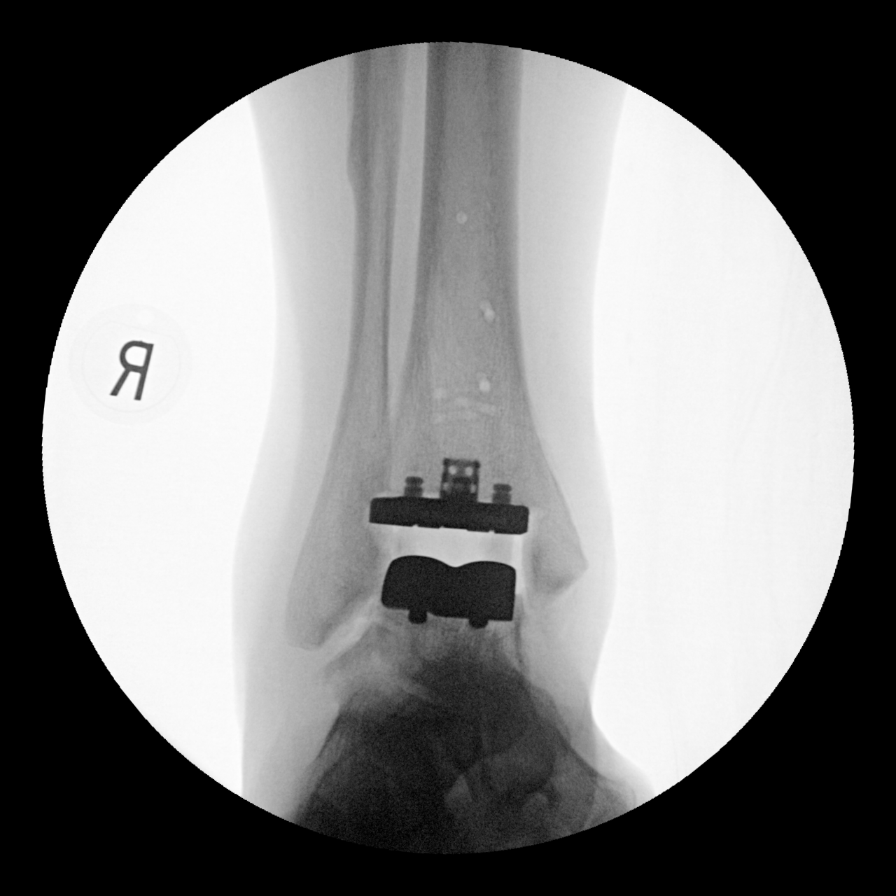
[im 4/5]
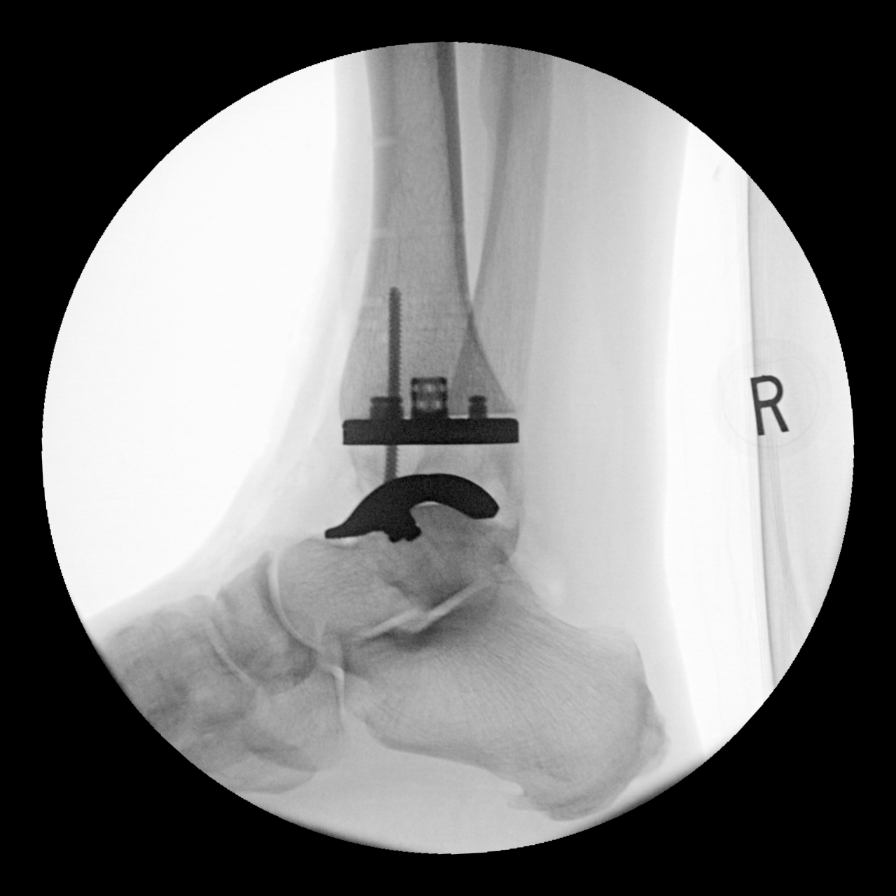
[im 5/5]
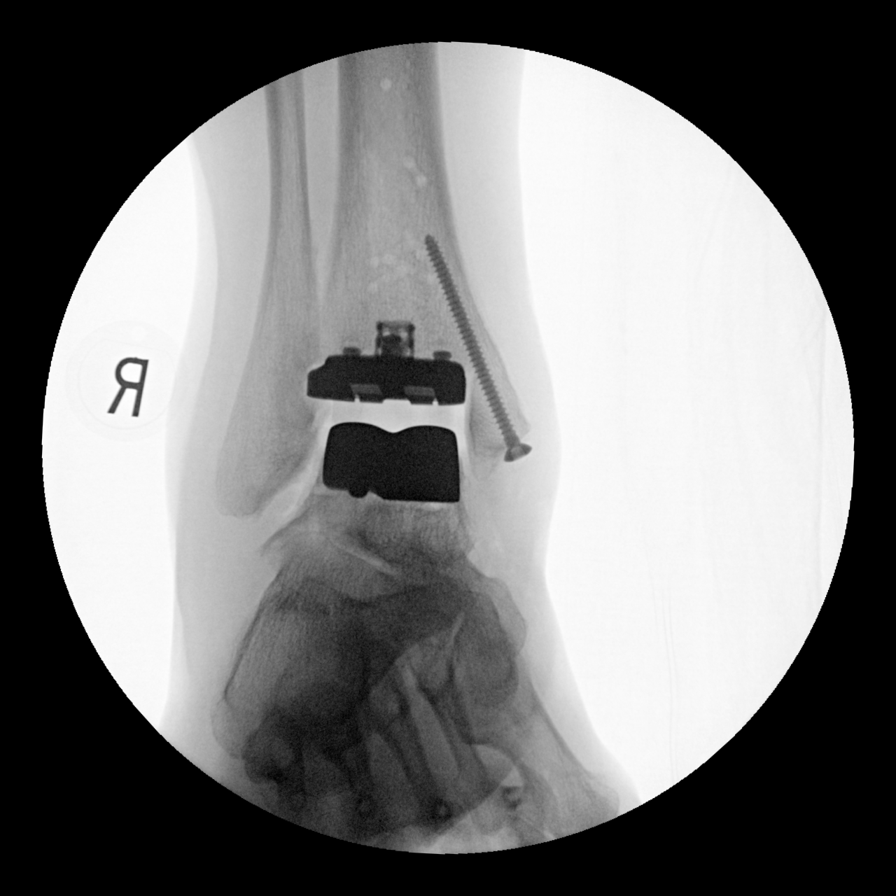

[5 of 5 positions shown; findings below may reference images not displayed]

FINDINGS: Five low resolution intraoperative spot views of the right ankle.
Total fluoroscopy time was 2 minutes 1 seconds, fluoroscopic dose of
3.55 mGy. The images demonstrate arthroplasty at the tibiotalar
joint. Long fixating screw in the medial malleolus.
IMPRESSION: Intraoperative fluoroscopic assistance provided during ankle surgery

## 2023-07-04 DIAGNOSIS — M25571 Pain in right ankle and joints of right foot: Secondary | ICD-10-CM | POA: Diagnosis not present

## 2023-07-15 DIAGNOSIS — E039 Hypothyroidism, unspecified: Secondary | ICD-10-CM | POA: Diagnosis not present

## 2023-07-24 DIAGNOSIS — M19072 Primary osteoarthritis, left ankle and foot: Secondary | ICD-10-CM | POA: Diagnosis not present

## 2023-07-24 DIAGNOSIS — M25872 Other specified joint disorders, left ankle and foot: Secondary | ICD-10-CM | POA: Diagnosis not present

## 2023-07-24 DIAGNOSIS — M25572 Pain in left ankle and joints of left foot: Secondary | ICD-10-CM | POA: Diagnosis not present

## 2023-07-24 DIAGNOSIS — M67962 Unspecified disorder of synovium and tendon, left lower leg: Secondary | ICD-10-CM | POA: Diagnosis not present

## 2023-07-31 DIAGNOSIS — L989 Disorder of the skin and subcutaneous tissue, unspecified: Secondary | ICD-10-CM | POA: Diagnosis not present

## 2023-07-31 DIAGNOSIS — D485 Neoplasm of uncertain behavior of skin: Secondary | ICD-10-CM | POA: Diagnosis not present

## 2023-07-31 DIAGNOSIS — L819 Disorder of pigmentation, unspecified: Secondary | ICD-10-CM | POA: Diagnosis not present

## 2023-07-31 DIAGNOSIS — L821 Other seborrheic keratosis: Secondary | ICD-10-CM | POA: Diagnosis not present

## 2023-07-31 DIAGNOSIS — D225 Melanocytic nevi of trunk: Secondary | ICD-10-CM | POA: Diagnosis not present

## 2023-08-15 DIAGNOSIS — M25572 Pain in left ankle and joints of left foot: Secondary | ICD-10-CM | POA: Diagnosis not present

## 2023-08-23 DIAGNOSIS — M6702 Short Achilles tendon (acquired), left ankle: Secondary | ICD-10-CM | POA: Diagnosis not present

## 2023-08-23 DIAGNOSIS — M76822 Posterior tibial tendinitis, left leg: Secondary | ICD-10-CM | POA: Diagnosis not present

## 2023-08-23 DIAGNOSIS — M19072 Primary osteoarthritis, left ankle and foot: Secondary | ICD-10-CM | POA: Diagnosis not present

## 2023-09-04 DIAGNOSIS — M19072 Primary osteoarthritis, left ankle and foot: Secondary | ICD-10-CM | POA: Diagnosis not present

## 2023-09-04 DIAGNOSIS — M6702 Short Achilles tendon (acquired), left ankle: Secondary | ICD-10-CM | POA: Diagnosis not present

## 2023-09-04 DIAGNOSIS — M76822 Posterior tibial tendinitis, left leg: Secondary | ICD-10-CM | POA: Diagnosis not present

## 2023-09-04 DIAGNOSIS — M2141 Flat foot [pes planus] (acquired), right foot: Secondary | ICD-10-CM | POA: Diagnosis not present

## 2023-09-04 DIAGNOSIS — M25371 Other instability, right ankle: Secondary | ICD-10-CM | POA: Diagnosis not present

## 2023-09-11 DIAGNOSIS — M19072 Primary osteoarthritis, left ankle and foot: Secondary | ICD-10-CM | POA: Diagnosis not present

## 2023-09-11 DIAGNOSIS — M67962 Unspecified disorder of synovium and tendon, left lower leg: Secondary | ICD-10-CM | POA: Diagnosis not present

## 2023-09-12 DIAGNOSIS — R7303 Prediabetes: Secondary | ICD-10-CM | POA: Diagnosis not present

## 2023-09-12 DIAGNOSIS — E039 Hypothyroidism, unspecified: Secondary | ICD-10-CM | POA: Diagnosis not present

## 2023-09-20 DIAGNOSIS — Z8673 Personal history of transient ischemic attack (TIA), and cerebral infarction without residual deficits: Secondary | ICD-10-CM | POA: Diagnosis not present

## 2023-09-20 DIAGNOSIS — R7303 Prediabetes: Secondary | ICD-10-CM | POA: Diagnosis not present

## 2023-09-20 DIAGNOSIS — E559 Vitamin D deficiency, unspecified: Secondary | ICD-10-CM | POA: Diagnosis not present

## 2023-09-20 DIAGNOSIS — E871 Hypo-osmolality and hyponatremia: Secondary | ICD-10-CM | POA: Diagnosis not present

## 2023-09-20 DIAGNOSIS — E7800 Pure hypercholesterolemia, unspecified: Secondary | ICD-10-CM | POA: Diagnosis not present

## 2023-09-20 DIAGNOSIS — E039 Hypothyroidism, unspecified: Secondary | ICD-10-CM | POA: Diagnosis not present

## 2023-09-20 DIAGNOSIS — I10 Essential (primary) hypertension: Secondary | ICD-10-CM | POA: Diagnosis not present

## 2023-09-20 DIAGNOSIS — E010 Iodine-deficiency related diffuse (endemic) goiter: Secondary | ICD-10-CM | POA: Diagnosis not present

## 2023-09-24 DIAGNOSIS — M51369 Other intervertebral disc degeneration, lumbar region without mention of lumbar back pain or lower extremity pain: Secondary | ICD-10-CM | POA: Diagnosis not present

## 2023-10-14 DIAGNOSIS — Z961 Presence of intraocular lens: Secondary | ICD-10-CM | POA: Diagnosis not present

## 2024-01-23 ENCOUNTER — Encounter (HOSPITAL_BASED_OUTPATIENT_CLINIC_OR_DEPARTMENT_OTHER): Payer: Self-pay | Admitting: Orthopedic Surgery

## 2024-01-24 ENCOUNTER — Encounter (HOSPITAL_BASED_OUTPATIENT_CLINIC_OR_DEPARTMENT_OTHER)
Admission: RE | Admit: 2024-01-24 | Discharge: 2024-01-24 | Disposition: A | Source: Ambulatory Visit | Attending: Orthopedic Surgery

## 2024-01-24 DIAGNOSIS — Z01818 Encounter for other preprocedural examination: Secondary | ICD-10-CM | POA: Insufficient documentation

## 2024-01-24 LAB — BASIC METABOLIC PANEL WITH GFR
Anion gap: 11 (ref 5–15)
BUN: 9 mg/dL (ref 8–23)
CO2: 23 mmol/L (ref 22–32)
Calcium: 9.4 mg/dL (ref 8.9–10.3)
Chloride: 100 mmol/L (ref 98–111)
Creatinine, Ser: 0.62 mg/dL (ref 0.44–1.00)
GFR, Estimated: >60 mL/min
Glucose, Bld: 105 mg/dL — ABNORMAL HIGH (ref 70–99)
Potassium: 4.3 mmol/L (ref 3.5–5.1)
Sodium: 135 mmol/L (ref 135–145)

## 2024-01-24 NOTE — Progress Notes (Signed)

## 2024-01-29 NOTE — Anesthesia Preprocedure Evaluation (Addendum)
"                                    Anesthesia Evaluation  Patient identified by MRN, date of birth, ID band Patient awake    Reviewed: Allergy & Precautions, NPO status , Patient's Chart, lab work & pertinent test results  History of Anesthesia Complications (+) PONV and history of anesthetic complications  Airway Mallampati: II  TM Distance: >3 FB Neck ROM: Full    Dental no notable dental hx. (+) Teeth Intact, Dental Advisory Given   Pulmonary neg pulmonary ROS, former smoker   Pulmonary exam normal breath sounds clear to auscultation       Cardiovascular hypertension, (-) angina (-) Past MI Normal cardiovascular exam Rhythm:Regular Rate:Normal     Neuro/Psych CVA (R sided weakness), Residual Symptoms  negative psych ROS   GI/Hepatic ,GERD  Medicated and Controlled,,  Endo/Other  Hypothyroidism    Renal/GU      Musculoskeletal  (+) Arthritis ,    Abdominal   Peds  Hematology   Anesthesia Other Findings All: codeine, pcn, procaine, simvastatin  Reproductive/Obstetrics                              Anesthesia Physical Anesthesia Plan  ASA: 3  Anesthesia Plan: General and Regional   Post-op Pain Management: Minimal or no pain anticipated, Tylenol  PO (pre-op)* and Regional block*   Induction:   PONV Risk Score and Plan: Treatment may vary due to age or medical condition, Ondansetron , Propofol  infusion and TIVA  Airway Management Planned: Oral ETT  Additional Equipment: None  Intra-op Plan:   Post-operative Plan: Extubation in OR  Informed Consent: I have reviewed the patients History and Physical, chart, labs and discussed the procedure including the risks, benefits and alternatives for the proposed anesthesia with the patient or authorized representative who has indicated his/her understanding and acceptance.     Dental advisory given  Plan Discussed with: CRNA and Surgeon  Anesthesia Plan Comments: (GA w  L pop + L adductor exparel )         Anesthesia Quick Evaluation  "

## 2024-01-30 ENCOUNTER — Encounter (HOSPITAL_BASED_OUTPATIENT_CLINIC_OR_DEPARTMENT_OTHER)
Admission: RE | Disposition: A | Discharge status: Home / Self Care | Payer: Self-pay | Source: Home / Self Care | Attending: Orthopedic Surgery

## 2024-01-30 ENCOUNTER — Encounter (HOSPITAL_BASED_OUTPATIENT_CLINIC_OR_DEPARTMENT_OTHER): Payer: Self-pay | Admitting: Orthopedic Surgery

## 2024-01-30 ENCOUNTER — Ambulatory Visit (HOSPITAL_BASED_OUTPATIENT_CLINIC_OR_DEPARTMENT_OTHER): Admitting: Anesthesiology

## 2024-01-30 ENCOUNTER — Other Ambulatory Visit: Payer: Self-pay

## 2024-01-30 ENCOUNTER — Ambulatory Visit (HOSPITAL_BASED_OUTPATIENT_CLINIC_OR_DEPARTMENT_OTHER)
Admission: RE | Admit: 2024-01-30 | Discharge: 2024-01-30 | Disposition: A | Discharge status: Home / Self Care | Attending: Orthopedic Surgery | Admitting: Orthopedic Surgery

## 2024-01-30 ENCOUNTER — Ambulatory Visit (HOSPITAL_BASED_OUTPATIENT_CLINIC_OR_DEPARTMENT_OTHER)

## 2024-01-30 DIAGNOSIS — Z6833 Body mass index (BMI) 33.0-33.9, adult: Secondary | ICD-10-CM | POA: Insufficient documentation

## 2024-01-30 DIAGNOSIS — E669 Obesity, unspecified: Secondary | ICD-10-CM | POA: Diagnosis not present

## 2024-01-30 DIAGNOSIS — M6702 Short Achilles tendon (acquired), left ankle: Secondary | ICD-10-CM | POA: Diagnosis present

## 2024-01-30 DIAGNOSIS — Z87891 Personal history of nicotine dependence: Secondary | ICD-10-CM | POA: Insufficient documentation

## 2024-01-30 DIAGNOSIS — X58XXXA Exposure to other specified factors, initial encounter: Secondary | ICD-10-CM | POA: Insufficient documentation

## 2024-01-30 DIAGNOSIS — M1990 Unspecified osteoarthritis, unspecified site: Secondary | ICD-10-CM | POA: Insufficient documentation

## 2024-01-30 DIAGNOSIS — I69351 Hemiplegia and hemiparesis following cerebral infarction affecting right dominant side: Secondary | ICD-10-CM | POA: Diagnosis not present

## 2024-01-30 DIAGNOSIS — K219 Gastro-esophageal reflux disease without esophagitis: Secondary | ICD-10-CM | POA: Insufficient documentation

## 2024-01-30 DIAGNOSIS — I10 Essential (primary) hypertension: Secondary | ICD-10-CM | POA: Diagnosis not present

## 2024-01-30 DIAGNOSIS — E039 Hypothyroidism, unspecified: Secondary | ICD-10-CM | POA: Insufficient documentation

## 2024-01-30 DIAGNOSIS — M67972 Unspecified disorder of synovium and tendon, left ankle and foot: Secondary | ICD-10-CM | POA: Diagnosis not present

## 2024-01-30 DIAGNOSIS — Z01818 Encounter for other preprocedural examination: Secondary | ICD-10-CM

## 2024-01-30 DIAGNOSIS — S93602A Unspecified sprain of left foot, initial encounter: Secondary | ICD-10-CM | POA: Insufficient documentation

## 2024-01-30 HISTORY — PX: GASTROCNEMIUS RECESSION: SHX863

## 2024-01-30 HISTORY — PX: LIGAMENT REPAIR: SHX5444

## 2024-01-30 HISTORY — DX: Cerebral infarction, unspecified: I63.9

## 2024-01-30 HISTORY — PX: CALCANEAL OSTEOTOMY: SHX1281

## 2024-01-30 MED ORDER — PHENYLEPHRINE HCL (PRESSORS) 10 MG/ML IV SOLN
INTRAVENOUS | Status: DC | PRN
  Administered 2024-01-30 (×2): 80 ug via INTRAVENOUS
  Administered 2024-01-30: 160 ug via INTRAVENOUS

## 2024-01-30 MED ORDER — PROPOFOL 10 MG/ML IV BOLUS
INTRAVENOUS | Status: DC | PRN
  Administered 2024-01-30: 40 mg via INTRAVENOUS
  Administered 2024-01-30: 90 mg via INTRAVENOUS
  Administered 2024-01-30: 40 mg via INTRAVENOUS
  Administered 2024-01-30: 20 mg via INTRAVENOUS

## 2024-01-30 MED ORDER — FENTANYL CITRATE (PF) 100 MCG/2ML IJ SOLN
INTRAMUSCULAR | Status: AC
  Filled 2024-01-30: qty 2

## 2024-01-30 MED ORDER — PROPOFOL 500 MG/50ML IV EMUL
INTRAVENOUS | Status: DC | PRN
  Administered 2024-01-30: 50 ug/kg/min via INTRAVENOUS

## 2024-01-30 MED ORDER — CEFAZOLIN SODIUM-DEXTROSE 2-4 GM/100ML-% IV SOLN
INTRAVENOUS | Status: AC
  Filled 2024-01-30: qty 100

## 2024-01-30 MED ORDER — DEXMEDETOMIDINE HCL IN NACL 80 MCG/20ML IV SOLN
INTRAVENOUS | Status: DC | PRN
  Administered 2024-01-30: 10 ug via INTRAVENOUS

## 2024-01-30 MED ORDER — DEXAMETHASONE SOD PHOSPHATE PF 10 MG/ML IJ SOLN
INTRAMUSCULAR | Status: AC
  Filled 2024-01-30: qty 1

## 2024-01-30 MED ORDER — CEFAZOLIN SODIUM-DEXTROSE 2-4 GM/100ML-% IV SOLN
2.0000 g | INTRAVENOUS | Status: AC
  Administered 2024-01-30: 2 g via INTRAVENOUS

## 2024-01-30 MED ORDER — LACTATED RINGERS IV SOLN: INTRAVENOUS | Status: DC

## 2024-01-30 MED ORDER — ROCURONIUM BROMIDE 10 MG/ML (PF) SYRINGE
PREFILLED_SYRINGE | INTRAVENOUS | Status: AC
  Filled 2024-01-30: qty 10

## 2024-01-30 MED ORDER — SODIUM CHLORIDE 0.9 % IV SOLN: INTRAVENOUS | Status: DC

## 2024-01-30 MED ORDER — PHENYLEPHRINE HCL-NACL 20-0.9 MG/250ML-% IV SOLN
INTRAVENOUS | Status: DC | PRN
  Administered 2024-01-30: 30 ug/min via INTRAVENOUS

## 2024-01-30 MED ORDER — VANCOMYCIN HCL 500 MG IV SOLR
INTRAVENOUS | Status: AC
  Filled 2024-01-30: qty 10

## 2024-01-30 MED ORDER — ONDANSETRON HCL 4 MG/2ML IJ SOLN
INTRAMUSCULAR | Status: AC
  Filled 2024-01-30: qty 2

## 2024-01-30 MED ORDER — FENTANYL CITRATE (PF) 100 MCG/2ML IJ SOLN
INTRAMUSCULAR | Status: DC | PRN
  Administered 2024-01-30 (×2): 50 ug via INTRAVENOUS

## 2024-01-30 MED ORDER — FENTANYL CITRATE (PF) 100 MCG/2ML IJ SOLN
100.0000 ug | Freq: Once | INTRAMUSCULAR | Status: AC
  Administered 2024-01-30: 50 ug via INTRAVENOUS

## 2024-01-30 MED ORDER — BUPIVACAINE-EPINEPHRINE (PF) 0.5% -1:200000 IJ SOLN
INTRAMUSCULAR | Status: DC | PRN
  Administered 2024-01-30: 20 mL via PERINEURAL
  Administered 2024-01-30: 10 mL via PERINEURAL

## 2024-01-30 MED ORDER — PROPOFOL 10 MG/ML IV BOLUS
INTRAVENOUS | Status: AC
  Filled 2024-01-30: qty 20

## 2024-01-30 MED ORDER — LIDOCAINE 2% (20 MG/ML) 5 ML SYRINGE
INTRAMUSCULAR | Status: AC
  Filled 2024-01-30: qty 5

## 2024-01-30 MED ORDER — ONDANSETRON HCL 4 MG/2ML IJ SOLN
INTRAMUSCULAR | Status: DC | PRN
  Administered 2024-01-30: 4 mg via INTRAVENOUS

## 2024-01-30 MED ORDER — SUGAMMADEX SODIUM 200 MG/2ML IV SOLN
INTRAVENOUS | Status: DC | PRN
  Administered 2024-01-30: 180 mg via INTRAVENOUS

## 2024-01-30 MED ORDER — ACETAMINOPHEN 10 MG/ML IV SOLN: 1000.0000 mg | Freq: Once | INTRAVENOUS | Status: DC | PRN

## 2024-01-30 MED ORDER — OXYCODONE HCL 5 MG PO TABS: 5.0000 mg | ORAL_TABLET | Freq: Once | ORAL | Status: DC | PRN

## 2024-01-30 MED ORDER — OXYCODONE HCL 5 MG/5ML PO SOLN: 5.0000 mg | Freq: Once | ORAL | Status: DC | PRN

## 2024-01-30 MED ORDER — RIVAROXABAN 10 MG PO TABS: 10.0000 mg | ORAL_TABLET | Freq: Every day | ORAL | 0 refills | Status: AC

## 2024-01-30 MED ORDER — EPHEDRINE 5 MG/ML INJ
INTRAVENOUS | Status: AC
  Filled 2024-01-30: qty 20

## 2024-01-30 MED ORDER — 0.9 % SODIUM CHLORIDE (POUR BTL) OPTIME
TOPICAL | Status: DC | PRN
  Administered 2024-01-30: 300 mL

## 2024-01-30 MED ORDER — PHENYLEPHRINE HCL-NACL 20-0.9 MG/250ML-% IV SOLN
INTRAVENOUS | Status: AC
  Filled 2024-01-30: qty 250

## 2024-01-30 MED ORDER — BUPIVACAINE LIPOSOME 1.3 % IJ SUSP
INTRAMUSCULAR | Status: DC | PRN
  Administered 2024-01-30: 10 mL via PERINEURAL

## 2024-01-30 MED ORDER — OXYCODONE HCL 5 MG PO TABS: 5.0000 mg | ORAL_TABLET | Freq: Four times a day (QID) | ORAL | 0 refills | Status: AC | PRN

## 2024-01-30 MED ORDER — DEXAMETHASONE SOD PHOSPHATE PF 10 MG/ML IJ SOLN
INTRAMUSCULAR | Status: DC | PRN
  Administered 2024-01-30: 10 mg via INTRAVENOUS

## 2024-01-30 MED ORDER — VANCOMYCIN HCL 500 MG IV SOLR
INTRAVENOUS | Status: DC | PRN
  Administered 2024-01-30: 500 mg via TOPICAL

## 2024-01-30 MED ORDER — PHENYLEPHRINE 80 MCG/ML (10ML) SYRINGE FOR IV PUSH (FOR BLOOD PRESSURE SUPPORT)
PREFILLED_SYRINGE | INTRAVENOUS | Status: AC
  Filled 2024-01-30: qty 10

## 2024-01-30 MED ORDER — EPHEDRINE SULFATE (PRESSORS) 25 MG/5ML IV SOSY
PREFILLED_SYRINGE | INTRAVENOUS | Status: DC | PRN
  Administered 2024-01-30: 5 mg via INTRAVENOUS

## 2024-01-30 MED ORDER — DEXAMETHASONE SOD PHOSPHATE PF 10 MG/ML IJ SOLN: INTRAMUSCULAR | Status: DC | PRN

## 2024-01-30 MED ORDER — ROCURONIUM 10MG/ML (10ML) SYRINGE FOR MEDFUSION PUMP - OPTIME
INTRAVENOUS | Status: DC | PRN
  Administered 2024-01-30: 70 mg via INTRAVENOUS

## 2024-01-30 MED ORDER — ESMOLOL HCL 100 MG/10ML IV SOLN
INTRAVENOUS | Status: DC | PRN
  Administered 2024-01-30: 30 mg via INTRAVENOUS

## 2024-01-30 MED ORDER — MIDAZOLAM HCL 2 MG/2ML IJ SOLN
INTRAMUSCULAR | Status: AC
  Filled 2024-01-30: qty 2

## 2024-01-30 MED ORDER — FENTANYL CITRATE (PF) 100 MCG/2ML IJ SOLN: 25.0000 ug | INTRAMUSCULAR | Status: DC | PRN

## 2024-01-30 NOTE — Anesthesia Procedure Notes (Addendum)
 Anesthesia Regional Block: Popliteal block   Pre-Anesthetic Checklist: , timeout performed,  Correct Patient, Correct Site, Correct Laterality,  Correct Procedure, Correct Position, site marked,  Risks and benefits discussed,  Surgical consent,  Pre-op evaluation,  At surgeon's request and post-op pain management  Laterality: Left and Lower  Prep: chloraprep       Needles:  Injection technique: Single-shot      Needle Length: 9cm  Needle Gauge: 22     Additional Needles: Arrow StimuQuik ECHO Echogenic Stimulating PNB Needle  Procedures:,,,, ultrasound used (permanent image in chart),,     Nerve Stimulator or Paresthesia:  Response: plantarflexion, 0.28 mA, 4.5 cm  Additional Responses:   Narrative:  Start time: 01/30/2024 1:41 PM End time: 01/30/2024 1:47 PM Injection made incrementally with aspirations every 5 mL.  Performed by: Personally  Anesthesiologist: Leopoldo Bruckner, MD

## 2024-01-30 NOTE — Anesthesia Procedure Notes (Signed)
 Procedure Name: Intubation Date/Time: 01/30/2024 2:21 PM  Performed by: Lenard Lacks, CRNAPre-anesthesia Checklist: Patient identified, Emergency Drugs available, Suction available and Patient being monitored Patient Re-evaluated:Patient Re-evaluated prior to induction Oxygen Delivery Method: Circle system utilized Preoxygenation: Pre-oxygenation with 100% oxygen Induction Type: IV induction Ventilation: Mask ventilation without difficulty Laryngoscope Size: Miller and 2 Grade View: Grade II Tube type: Oral Tube size: 7.0 mm Number of attempts: 1 Airway Equipment and Method: Stylet Placement Confirmation: ETT inserted through vocal cords under direct vision, positive ETCO2 and breath sounds checked- equal and bilateral Tube secured with: Tape Dental Injury: Teeth and Oropharynx as per pre-operative assessment

## 2024-01-30 NOTE — Transfer of Care (Signed)
 Immediate Anesthesia Transfer of Care Note  Patient: BRIANNAH LONA  Procedure(s) Performed: Left gastrocnemius recession (Left: Leg Lower) Medializing calcaneal osteotomy (Left: Foot) Posterior tibial tendon tenolysis, flexor digitorum longus transfer to the navicular (Left: Ankle) Spring ligament repair (Left: Ankle)  Patient Location: PACU  Anesthesia Type:General and Regional  Level of Consciousness: awake, alert , and patient cooperative  Airway & Oxygen Therapy: Patient Spontanous Breathing and Patient connected to nasal cannula oxygen  Post-op Assessment: Report given to RN and Post -op Vital signs reviewed and stable  Post vital signs: Reviewed and stable  Last Vitals:  Vitals Value Taken Time  BP 115/68 01/30/24 15:50  Temp    Pulse 104 01/30/24 15:51  Resp 17 01/30/24 15:51  SpO2 98 % 01/30/24 15:51  Vitals shown include unfiled device data.  Last Pain:  Vitals:   01/30/24 1112  TempSrc: Oral  PainSc: 0-No pain      Patients Stated Pain Goal: 4 (01/30/24 1112)  Complications: No notable events documented.

## 2024-01-30 NOTE — Discharge Instructions (Addendum)
 Norleen Armor, MD [facility]  Please read the following information regarding your care after surgery.  Medications  You only need a prescription for the narcotic pain medicine (ex. oxycodone , Percocet, Norco).  All of the other medicines listed below are available over the counter. X acetominophen (Tylenol ) 650 mg every 4-6 hours as you need for minor to moderate pain X oxycodone  as prescribed for severe pain  Narcotic pain medicine (ex. oxycodone , Percocet, Vicodin) will cause constipation.  To prevent this problem, take the following medicines while you are taking any pain medicine. X docusate sodium  (Colace) 100 mg twice a day X senna (Senokot) 2 tablets twice a day  X To help prevent blood clots, take Xarelto  once a day for two weeks after surgery.  You should also get up every hour while you are awake to move around.    Weight Bearing X Do not bear any weight on the operated leg or foot.  Cast / Splint / Dressing X Keep your splint and dressing clean and dry.  Dont put anything (coat hanger, pencil, etc) down inside of it.  If it gets damp, use a hair dryer on the cool setting to dry it.  If it gets soaked, call the office to schedule an appointment for a cast change.  After your splint is removed; you may shower, but do not soak or scrub the wound.  Allow the water to run over it, and then gently pat it dry.  Swelling It is normal for you to have swelling where you had surgery.  To reduce swelling and pain, keep your toes above your nose for at least 3 days after surgery.  It may be necessary to keep your foot or leg elevated for several weeks.  If it hurts, it should be elevated.  Follow Up Call my office at [phone] when you are discharged from the hospital or surgery center to schedule an appointment to be seen two weeks after surgery.  Call my office at [phone] if you develop a fever >101.5 F, nausea, vomiting, bleeding from the surgical site or severe pain.      Regional Anesthesia Blocks  1. You may not be able to move or feel the blocked extremity after a regional anesthetic block. This may last may last from 3-48 hours after placement, but it will go away. The length of time depends on the medication injected and your individual response to the medication. As the nerves start to wake up, you may experience tingling as the movement and feeling returns to your extremity. If the numbness and inability to move your extremity has not gone away after 48 hours, please call your surgeon.   2. The extremity that is blocked will need to be protected until the numbness is gone and the strength has returned. Because you cannot feel it, you will need to take extra care to avoid injury. Because it may be weak, you may have difficulty moving it or using it. You may not know what position it is in without looking at it while the block is in effect.  3. For blocks in the legs and feet, returning to weight bearing and walking needs to be done carefully. You will need to wait until the numbness is entirely gone and the strength has returned. You should be able to move your leg and foot normally before you try and bear weight or walk. You will need someone to be with you when you first try to ensure you do not  fall and possibly risk injury.  4. Bruising and tenderness at the needle site are common side effects and will resolve in a few days.  5. Persistent numbness or new problems with movement should be communicated to the surgeon or the [facility] [phone] [facility] [phone]).    Post Anesthesia Home Care Instructions  Activity: Get plenty of rest for the remainder of the day. A responsible individual must stay with you for 24 hours following the procedure.  For the next 24 hours, DO NOT: -Drive a car -Advertising copywriter -Drink alcoholic beverages -Take any medication unless instructed by your physician -Make any legal  decisions or sign important papers.  Meals: Start with liquid foods such as gelatin or soup. Progress to regular foods as tolerated. Avoid greasy, spicy, heavy foods. If nausea and/or vomiting occur, drink only clear liquids until the nausea and/or vomiting subsides. Call your physician if vomiting continues.  Special Instructions/Symptoms: Your throat may feel dry or sore from the anesthesia or the breathing tube placed in your throat during surgery. If this causes discomfort, gargle with warm salt water. The discomfort should disappear within 24 hours.   Information for Discharge Teaching: EXPAREL  (bupivacaine  liposome injectable suspension)   Pain relief is important to your recovery. The goal is to control your pain so you can move easier and return to your normal activities as soon as possible after your procedure. Your physician may use several types of medicines to manage pain, swelling, and more.  Your surgeon or anesthesiologist gave you EXPAREL (bupivacaine ) to help control your pain after surgery.  EXPAREL  is a local anesthetic designed to release slowly over an extended period of time to provide pain relief by numbing the tissue around the surgical site. EXPAREL  is designed to release pain medication over time and can control pain for up to 72 hours. Depending on how you respond to EXPAREL , you may require less pain medication during your recovery. EXPAREL  can help reduce or eliminate the need for opioids during the first few days after surgery when pain relief is needed the most. EXPAREL  is not an opioid and is not addictive. It does not cause sleepiness or sedation.   Important! A teal colored band has been placed on your arm with the date, time and amount of EXPAREL  you have received. Please leave this armband in place for the full 96 hours following administration, and then you may remove the band. If you return to the hospital for any reason within 96 hours following the  administration of EXPAREL , the armband provides important information that your health care providers to know, and alerts them that you have received this anesthetic.    Possible side effects of EXPAREL : Temporary loss of sensation or ability to move in the area where medication was injected. Nausea, vomiting, constipation Rarely, numbness and tingling in your mouth or lips, lightheadedness, or anxiety may occur. Call your doctor right away if you think you may be experiencing any of these sensations, or if you have other questions regarding possible side effects.  Follow all other discharge instructions given to you by your surgeon or nurse. Eat a healthy diet and drink plenty of water or other fluids.

## 2024-01-30 NOTE — H&P (Signed)
 Monica Hinton is an 79 y.o. female.   Chief Complaint: left foot pain HPI: 79 y/o female without significant PMH c/o worsening left foot pain and malalignment due to posterior tibial tendon dysfunction.  She has failed non op treatment and presents today for flatfoot reconstruction and gastronemius recession.  Past Medical History:  Diagnosis Date   Allergic rhinitis    Asthma    seasonal    Bursitis    r hip    GERD (gastroesophageal reflux disease)    High cholesterol    Hypercholesteremia    Hypertension    Hypothyroidism    Hypothyroidism    Insomnia    Leukocytosis    Obesity    Peripheral vascular disease    bilateral LE edema ; edma in hands as well    PONV (postoperative nausea and vomiting)    Stroke (HCC)    Thrombocytosis    Vitamin D deficiency     Past Surgical History:  Procedure Laterality Date   bladder      bladder tack  20 years ago    COLONOSCOPY     EYE SURGERY Bilateral    catracts   hysterectomy      20 years    LUMBAR FUSION     OPEN SURGICAL REPAIR OF GLUTEAL TENDON Right 09/19/2016   Procedure: Right hip bursectomy and gluteal tendon repair;  Surgeon: Melodi Lerner, MD;  Location: [facility];  Service: Orthopedics;  Laterality: Right;   TONSILLECTOMY     age 84    TOTAL ANKLE ARTHROPLASTY Right 04/27/2021   Procedure: TOTAL ANKLE ARTHROPLASTY;  Surgeon: Kit Rush, MD;  Location: [facility];  Service: Orthopedics;  Laterality: Right;    Family History  Problem Relation Age of Onset   Colon cancer Mother        50s   Hip fracture Mother    Stroke Mother    Heart attack Father    Social History:  reports that she has quit smoking. Her smoking use included cigarettes. She has a 3 pack-year smoking history. She has never used smokeless tobacco. She reports current alcohol use. She reports that she does not use drugs.  Allergies: Allergies[1]  Medications Prior to Admission  Medication Sig Dispense Refill   acetaminophen   (TYLENOL ) 500 MG tablet Take 1,000 mg by mouth 3 (three) times daily.     aspirin  EC 81 MG tablet Take 81 mg by mouth daily. Swallow whole.     Biotin 5000 MCG TABS Take by mouth.     cetirizine (ZYRTEC) 10 MG tablet Take 10 mg by mouth daily.     ezetimibe (ZETIA) 10 MG tablet Take 10 mg by mouth daily.     gabapentin  (NEURONTIN ) 300 MG capsule Take 600 mg by mouth 3 (three) times daily.     levothyroxine  (SYNTHROID ) 112 MCG tablet Take 112 mcg by mouth daily before breakfast.     losartan  (COZAAR ) 100 MG tablet Take 100 mg by mouth daily.     montelukast  (SINGULAIR ) 10 MG tablet Take 1 tablet by mouth daily.     RABEprazole (ACIPHEX) 20 MG tablet Take 20 mg by mouth daily before breakfast.      rosuvastatin  (CRESTOR ) 40 MG tablet Take 40 mg by mouth daily.     triamcinolone  (NASACORT  ALLERGY 24HR) 55 MCG/ACT AERO nasal inhaler Place 2 sprays into the nose daily.     Vitamin D, Ergocalciferol, (DRISDOL) 1.25 MG (50000 UNIT) CAPS capsule Take 50,000 Units by mouth  every Monday. Patient takes on Wednesday     hydrochlorothiazide  (HYDRODIURIL ) 25 MG tablet 12.5 mg as needed.      No results found for this or any previous visit (from the past 48 hours). No results found.  Review of Systems  no recent f/c/n/v/wt loss.  Blood pressure (!) 145/77, pulse 73, temperature 98.3 F (36.8 C), temperature source Oral, resp. rate 15, height 5' 3 (1.6 m), weight 86.4 kg, SpO2 98%. Physical Exam  Wn wd woman in nad.  A and O.  EOMI.  Resp unlabored.  L foot with pes planus.  Skin is healthy.  No lymphadenopathy.  Pulses are palpable in the foot.  Heelcord is tight.  Assessment/Plan L short achilles and posterior tibial tendon dysfuntion - to the OR today for gastroc recession, medializing calcaneus osteotomy, posterior tibial tendon tenolysis, FDL transfer to the navicular, and possible talonavicular joint arthrodesis.  The risks and benefits of the alternative treatment options have been discussed in  detail.  The patient wishes to proceed with surgery and specifically understands risks of bleeding, infection, nerve damage, blood clots, need for additional surgery, amputation and death.   Norleen Armor, MD Feb 12, 2024, 12:21 PM       [1]  Allergies Allergen Reactions   Yellow Jacket Venom [Bee Venom] Anaphylaxis    Pt has EPI PEN   Codeine Nausea And Vomiting and Other (See Comments)   Milk (Cow)    Other     Lactose Intolerant     Penicillins Hives and Other (See Comments)    Many years - dr does not put pt on this   Procaine Other (See Comments)    Other reaction(s): fever,chills   Simvastatin Other (See Comments)    Other reaction(s): rash

## 2024-01-30 NOTE — Op Note (Signed)
 01/30/2024  3:52 PM  PATIENT:  Monica Hinton  79 y.o. female  PRE-OPERATIVE DIAGNOSIS: 1.  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  Short left Achilles tendon  POST-OPERATIVE DIAGNOSIS:  1.  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  Short left Achilles tendon 3.  Sprain of calcaneonavicular portion of the left deltoid ligament  Procedures: 1.  Left gastrocnemius recession 2.  Medializing calcaneal osteotomy through a separate incision 3.  Left Posterior tibial tendon tenolysis 4.  Deep transfer of left flexor digitorum longus to the navicular 5.  Repair of calcaneonavicular portion of the left deltoid ligament 6.  AP, lateral and Harris heel radiographs of the left foot  SURGEON:  Norleen Armor, MD  ASSISTANT: Dickey Sales, PA-C  ANESTHESIA:   General, regional  EBL:  minimal   TOURNIQUET:   Total Tourniquet Time Documented: Thigh (Left) - 65 minutes Total: Thigh (Left) - 65 minutes  COMPLICATIONS:  None apparent  DISPOSITION:  Extubated, awake and stable to recovery.  INDICATION FOR PROCEDURE: 79 year old woman without significant past medical history complains of worsening left medial ankle and hindfoot pain.  She has a short Achilles as well as posterior tibial tendon dysfunction.  She has failed nonoperative treatment including bracing, activity modification, oral anti-inflammatories and physical therapy.  She presents now for surgical treatment of this progressively limiting left foot condition.  The risks and benefits of the alternative treatment options have been discussed in detail.  The patient wishes to proceed with surgery and specifically understands risks of bleeding, infection, nerve damage, blood clots, need for additional surgery, amputation and death.   PROCEDURE IN DETAIL:  After pre operative consent was obtained, and the correct operative site was identified, the patient was brought to the operating room and placed supine on the  OR table.  Anesthesia was administered.  Pre-operative antibiotics were administered.  A surgical timeout was taken.  The left lower extremity was prepped and draped in standard sterile fashion with a tourniquet around the thigh.  The extremity was elevated, and the tourniquet was inflated to 250 mmHg.  A longitudinal incision was made over the medial calf.  Dissection was carried sharply down through the subcutaneous tissues.  Superficial fascia was incised.  The gastrocnemius tendon was identified.  The plantaris tendon was also identified and was divided under direct vision.  The gastric medius tendon was then divided from medial to lateral under direct vision.  The ankle was then dorsiflex 20 degrees with the knee extended.  The wound was then irrigated copiously, sprinkled with vancomycin  powder and closed with Monocryl and nylon.  Attention was turned to the lateral hindfoot.  An oblique incision was made over the lateral aspect of the heel.  Dissection was carried sharply down through the subcutaneous tissues.  The periosteum was incised.  The osteotomy was marked with a guidepin.  A radiograph confirmed the appropriate position.  The soft tissues were protected while an oscillating saw was used to cut through the isthmus of the calcaneus tuberosity.  The osteotomy was mobilized.  The tuberosity was translated medially and fixed with 2 guidepins.  Lateral and Harris heel radiographs confirmed appropriate position of the guidepins.  Both were overdrilled.  4 mm partially-threaded Zimmer [employer] cannulated screws were inserted.  Both were noted to have adequate purchase and compressed the osteotomy appropriately.  The incisions were irrigated and closed with nylon after sprinkling vancomycin  powder.  Attention was turned to the medial hindfoot.  An incision was  made along the course of the posterior tibial tendon from the medial malleolus to the navicular.  Dissection was carried down through the  subcutaneous tissues.  The posterior tibial tendon sheath was incised and elevated proximally and distally.  The tendon was noted to have significant tenosynovitis.  This was debrided sharply with scissors.  The spring ligament was noted to have a tear from dorsal to plantar that was a laminar with intact leaflets proximally and distally.  Laminar tear was debrided to help the tissues.  Imbricating sutures of 0 Vicryl were placed across the leaflets of the tear and tagged for later tying.  The FDL tendon was identified.  It was dissected distally to the knot of Victory.  It was transected and the and whipstitched with 0 Vicryl.  A drill hole was made from dorsal to plantar and the navicular after confirming appropriate trajectory with the fluoroscopic view.  The FDL tendon was pulled from plantar to dorsal through the hole in the navicular.  The FDL tendon was tensioned appropriately and fixed with a 7 mm [employer] medical bolt.  The suture ends were then used to approximate the tendon and to the adjacent periosteum of the navicular.  An AP radiograph confirmed appropriate alignment of the navicular on the head of the talus and appropriate position of the interference screw.  The foot was held in a plantarflexed and inverted position.  The spring ligament sutures were then securely tied.  The wound was irrigated copiously and sprinkled with vancomycin  powder.  Deep subcutaneous tissues were approximated with inverted simple sutures of 0 Vicryl.  The skin incision was closed with running 3-0 nylon.  Sterile dressings were applied followed by a well-padded short leg splint.  The tourniquet was released after application of the dressings.  The patient was awakened from anesthesia and transported to the recovery room in stable condition.   FOLLOW UP PLAN: Nonweightbearing on the left lower extremity.  Xarelto  for DVT prophylaxis.  Follow-up in the office in 2 weeks for suture removal and conversion to a short leg  cast carefully dorsiflexing and everting the hindfoot.   RADIOGRAPHS: AP, lateral and Harris heel radiographs of the left foot are obtained intraoperatively.  These show interval reconstruction of the arch with medialization of the calcaneus.  Hardware is appropriately positioned and of the appropriate lengths.  No other acute injuries are noted.   Dickey Sales, PA-C was present and scrubbed for the duration of the operative case. His assistance was essential in positioning the patient, prepping and draping, gaining and maintaining exposure, performing the operation, closing and dressing the wounds and applying the splint.

## 2024-01-30 NOTE — Anesthesia Postprocedure Evaluation (Signed)
"   Anesthesia Post Note  Patient: Monica Hinton  Procedure(s) Performed: Left gastrocnemius recession (Left: Leg Lower) Medializing calcaneal osteotomy (Left: Foot) Posterior tibial tendon tenolysis, flexor digitorum longus transfer to the navicular (Left: Ankle) Spring ligament repair (Left: Ankle)     Patient location during evaluation: PACU Anesthesia Type: Regional and General Level of consciousness: awake and alert Pain management: pain level controlled Vital Signs Assessment: post-procedure vital signs reviewed and stable Respiratory status: spontaneous breathing, nonlabored ventilation and respiratory function stable Cardiovascular status: blood pressure returned to baseline and stable Postop Assessment: no apparent nausea or vomiting Anesthetic complications: no   No notable events documented.  Last Vitals:  Vitals:   01/30/24 1600 01/30/24 1615  BP: (!) 151/78   Pulse: (!) 103 92  Resp: 14 16  Temp:    SpO2: 93% 94%    Last Pain:  Vitals:   01/30/24 1550  TempSrc:   PainSc: 0-No pain                 Netasha Wehrli,W. EDMOND      "

## 2024-01-30 NOTE — Anesthesia Procedure Notes (Signed)
 Anesthesia Regional Block: Adductor canal block   Pre-Anesthetic Checklist: , timeout performed,  Correct Patient, Correct Site, Correct Laterality,  Correct Procedure, Correct Position, site marked,  Risks and benefits discussed,  Surgical consent,  Pre-op evaluation,  At surgeon's request and post-op pain management  Laterality: Left  Prep: chloraprep       Needles:  Injection technique: Single-shot      Needle Length: 9cm  Needle Gauge: 22     Additional Needles: Arrow StimuQuik ECHO Echogenic Stimulating PNB Needle  Procedures:,,,, ultrasound used (permanent image in chart),,    Narrative:  Start time: 01/30/2024 1:35 PM End time: 01/30/2024 1:40 PM Injection made incrementally with aspirations every 5 mL.  Performed by: Personally  Anesthesiologist: Leopoldo Bruckner, MD

## 2024-01-30 NOTE — Progress Notes (Signed)
 Assisted Dr. Brain Cahill with left, adductor canal, popliteal, ultrasound guided block. Side rails up, monitors on throughout procedure. See vital signs in flow sheet. Tolerated Procedure well.

## 2024-01-31 ENCOUNTER — Encounter (HOSPITAL_BASED_OUTPATIENT_CLINIC_OR_DEPARTMENT_OTHER): Payer: Self-pay | Admitting: Orthopedic Surgery
